# Patient Record
Sex: Female | Born: 1970
Health system: Southern US, Community
[De-identification: ages and names within clinical notes are randomized; demographics above are authoritative.]

## PROBLEM LIST (undated history)

## (undated) DIAGNOSIS — S62609A Fracture of unspecified phalanx of unspecified finger, initial encounter for closed fracture: Secondary | ICD-10-CM

## (undated) DIAGNOSIS — U071 COVID-19: Secondary | ICD-10-CM

## (undated) DIAGNOSIS — I8390 Asymptomatic varicose veins of unspecified lower extremity: Secondary | ICD-10-CM

## (undated) DIAGNOSIS — D649 Anemia, unspecified: Secondary | ICD-10-CM

## (undated) DIAGNOSIS — Z803 Family history of malignant neoplasm of breast: Secondary | ICD-10-CM

## (undated) DIAGNOSIS — C801 Malignant (primary) neoplasm, unspecified: Secondary | ICD-10-CM

## (undated) DIAGNOSIS — R112 Nausea with vomiting, unspecified: Secondary | ICD-10-CM

## (undated) DIAGNOSIS — G5600 Carpal tunnel syndrome, unspecified upper limb: Secondary | ICD-10-CM

## (undated) DIAGNOSIS — K219 Gastro-esophageal reflux disease without esophagitis: Secondary | ICD-10-CM

## (undated) DIAGNOSIS — Z9889 Other specified postprocedural states: Secondary | ICD-10-CM

## (undated) HISTORY — PX: ABDOMINAL HYSTERECTOMY: SHX81

## (undated) HISTORY — DX: Family history of malignant neoplasm of breast: Z80.3

## (undated) HISTORY — DX: Asymptomatic varicose veins of unspecified lower extremity: I83.90

## (undated) HISTORY — PX: VARICOSE VEIN SURGERY: SHX832

## (undated) HISTORY — DX: Carpal tunnel syndrome, unspecified upper limb: G56.00

---

## 2003-04-02 HISTORY — PX: TUBAL LIGATION: SHX77

## 2008-09-02 ENCOUNTER — Ambulatory Visit: Payer: Self-pay | Admitting: Internal Medicine

## 2008-09-20 ENCOUNTER — Ambulatory Visit: Payer: Self-pay | Admitting: Gastroenterology

## 2008-10-18 ENCOUNTER — Ambulatory Visit: Payer: Self-pay | Admitting: Internal Medicine

## 2009-12-28 ENCOUNTER — Ambulatory Visit: Payer: Self-pay | Admitting: Gastroenterology

## 2010-01-11 ENCOUNTER — Ambulatory Visit: Payer: Self-pay | Admitting: Gastroenterology

## 2010-01-25 ENCOUNTER — Ambulatory Visit: Payer: Self-pay | Admitting: Gastroenterology

## 2011-05-16 ENCOUNTER — Ambulatory Visit: Payer: Self-pay

## 2011-05-21 ENCOUNTER — Ambulatory Visit (INDEPENDENT_AMBULATORY_CARE_PROVIDER_SITE_OTHER): Payer: Self-pay

## 2011-05-21 DIAGNOSIS — R197 Diarrhea, unspecified: Secondary | ICD-10-CM

## 2011-06-06 ENCOUNTER — Ambulatory Visit (INDEPENDENT_AMBULATORY_CARE_PROVIDER_SITE_OTHER): Payer: Self-pay

## 2011-06-06 DIAGNOSIS — K5900 Constipation, unspecified: Secondary | ICD-10-CM

## 2011-06-18 ENCOUNTER — Ambulatory Visit (INDEPENDENT_AMBULATORY_CARE_PROVIDER_SITE_OTHER): Payer: Self-pay

## 2011-06-18 DIAGNOSIS — K5900 Constipation, unspecified: Secondary | ICD-10-CM

## 2011-07-04 ENCOUNTER — Ambulatory Visit (INDEPENDENT_AMBULATORY_CARE_PROVIDER_SITE_OTHER): Payer: Self-pay

## 2011-07-04 DIAGNOSIS — K589 Irritable bowel syndrome without diarrhea: Secondary | ICD-10-CM

## 2011-08-01 ENCOUNTER — Ambulatory Visit (INDEPENDENT_AMBULATORY_CARE_PROVIDER_SITE_OTHER): Payer: Self-pay

## 2011-08-01 DIAGNOSIS — K589 Irritable bowel syndrome without diarrhea: Secondary | ICD-10-CM

## 2011-08-29 ENCOUNTER — Ambulatory Visit (INDEPENDENT_AMBULATORY_CARE_PROVIDER_SITE_OTHER): Payer: Self-pay

## 2011-08-29 DIAGNOSIS — K589 Irritable bowel syndrome without diarrhea: Secondary | ICD-10-CM

## 2011-09-12 ENCOUNTER — Ambulatory Visit (INDEPENDENT_AMBULATORY_CARE_PROVIDER_SITE_OTHER): Payer: Self-pay

## 2011-09-12 DIAGNOSIS — K589 Irritable bowel syndrome without diarrhea: Secondary | ICD-10-CM

## 2012-02-20 ENCOUNTER — Ambulatory Visit (INDEPENDENT_AMBULATORY_CARE_PROVIDER_SITE_OTHER): Payer: Self-pay

## 2012-02-20 DIAGNOSIS — K589 Irritable bowel syndrome without diarrhea: Secondary | ICD-10-CM

## 2012-04-01 HISTORY — PX: AUGMENTATION MAMMAPLASTY: SUR837

## 2012-04-28 ENCOUNTER — Other Ambulatory Visit: Payer: Self-pay

## 2012-05-28 ENCOUNTER — Other Ambulatory Visit: Payer: Self-pay

## 2012-12-16 ENCOUNTER — Encounter: Payer: Self-pay | Admitting: Emergency Medicine

## 2012-12-16 ENCOUNTER — Emergency Department (INDEPENDENT_AMBULATORY_CARE_PROVIDER_SITE_OTHER)
Admission: EM | Admit: 2012-12-16 | Discharge: 2012-12-16 | Disposition: A | Discharge status: Home / Self Care | Payer: 59 | Source: Home / Self Care

## 2012-12-16 DIAGNOSIS — J069 Acute upper respiratory infection, unspecified: Secondary | ICD-10-CM

## 2012-12-16 MED ORDER — AMOXICILLIN 875 MG PO TABS: 875.0000 mg | ORAL_TABLET | Freq: Two times a day (BID) | ORAL | Status: DC

## 2012-12-16 MED ORDER — BENZONATATE 200 MG PO CAPS: 200.0000 mg | ORAL_CAPSULE | Freq: Every day | ORAL | Status: DC

## 2012-12-16 NOTE — ED Notes (Signed)
Reports 3 day history of fever, congestion, cough; now primarily congestion. Took Day-quil this a.m. and ibuprofen 800mg . At 1700 today.

## 2012-12-16 NOTE — ED Provider Notes (Signed)
CSN: [id number]     Arrival date & time 12/16/12  1853 History   First MD Initiated Contact with Patient 12/16/12 1941     Chief Complaint  Patient presents with  . Nasal Congestion  . Fever     HPI Comments: Patient returned from a cruise 3 days ago when she developed URI symptoms consisting of a mild cough, nasal congestion, headache, chills/sweats, and fatigue.     The history is provided by the patient.    History reviewed. No pertinent past medical history. Past Surgical History  Procedure Laterality Date  . Tubal ligation     Family History  Problem Relation Age of Onset  . Heart failure Father   . Hypertension Father    History  Substance Use Topics  . Smoking status: Never Smoker   . Smokeless tobacco: Not on file  . Alcohol Use: Not on file   OB History   Grav Para Term Preterm Abortions TAB SAB Ect Mult Living                 Review of Systems + sore throat, resolved + cough No pleuritic pain No wheezing + nasal congestion + post-nasal drainage No sinus pain/pressure No itchy/red eyes + earache, resolved No hemoptysis No SOB No fever, + chills No nausea No vomiting No abdominal pain No diarrhea No urinary symptoms No skin rashes + fatigue No myalgias + headache Used OTC meds without relief  Allergies  Review of patient's allergies indicates no known allergies.  Home Medications   Current Outpatient Rx  Name  Route  Sig  Dispense  Refill  . amoxicillin (AMOXIL) 875 MG tablet   Oral   Take 1 tablet (875 mg total) by mouth 2 (two) times daily. (Rx void after 12/24/12)   20 tablet   0   . benzonatate (TESSALON) 200 MG capsule   Oral   Take 1 capsule (200 mg total) by mouth at bedtime. Take as needed for cough   12 capsule   0    BP 120/79  Temp(Src) 98.7 F (37.1 C) (Oral)  Resp 18  Ht 5\' 7"  (1.702 m)  Wt 190 lb (86.183 kg)  BMI 29.75 kg/m2  SpO2 98%  LMP 12/14/2012 Physical Exam Nursing notes and Vital Signs  reviewed. Appearance:  Patient appears healthy, stated age, and in no acute distress Eyes:  Pupils are equal, round, and reactive to light and accomodation.  Extraocular movement is intact.  Conjunctivae are not inflamed  Ears:  Canals normal.  Tympanic membranes normal.  Nose:  Mildly congested turbinates.  No sinus tenderness.    Pharynx:  Normal Neck:  Supple.  Slightly tender shotty posterior nodes are palpated bilaterally  Lungs:  Clear to auscultation.  Breath sounds are equal.  Heart:  Regular rate and rhythm without murmurs, rubs, or gallops.  Abdomen:  Nontender without masses or hepatosplenomegaly.  Bowel sounds are present.  No CVA or flank tenderness.  Extremities:  No edema.  No calf tenderness Skin:  No rash present.   ED Course  Procedures  none     MDM   1. Acute upper respiratory infections of unspecified site    There is no evidence of bacterial infection today.   Treat symptomatically for now  Prescription written for Benzonatate (Tessalon) to take at bedtime for night-time cough.  Take Mucinex D (guaifenesin with decongestant) twice daily for congestion.  Increase fluid intake, rest. May use Afrin nasal spray (or generic oxymetazoline) twice daily  for about 5 days.  Also recommend using saline nasal spray several times daily and saline nasal irrigation (AYR is a common brand) Stop all antihistamines for now, and other non-prescription cough/cold preparations. Begin Amoxicillin if not improving about one week or if persistent fever develops (Given a prescription to hold, with an expiration date)  Follow-up with family doctor if not improving about10 days.    Lattie Haw, MD 12/21/12 1052

## 2013-10-06 ENCOUNTER — Emergency Department (HOSPITAL_COMMUNITY)
Admission: EM | Admit: 2013-10-06 | Discharge: 2013-10-06 | Disposition: A | Discharge status: Home / Self Care | Payer: 59 | Attending: Emergency Medicine | Admitting: Emergency Medicine

## 2013-10-06 ENCOUNTER — Encounter (HOSPITAL_COMMUNITY): Payer: Self-pay | Admitting: Emergency Medicine

## 2013-10-06 DIAGNOSIS — Z79899 Other long term (current) drug therapy: Secondary | ICD-10-CM | POA: Insufficient documentation

## 2013-10-06 DIAGNOSIS — Y92410 Unspecified street and highway as the place of occurrence of the external cause: Secondary | ICD-10-CM | POA: Insufficient documentation

## 2013-10-06 DIAGNOSIS — Y9389 Activity, other specified: Secondary | ICD-10-CM | POA: Diagnosis not present

## 2013-10-06 DIAGNOSIS — IMO0002 Reserved for concepts with insufficient information to code with codable children: Secondary | ICD-10-CM | POA: Diagnosis not present

## 2013-10-06 DIAGNOSIS — M545 Low back pain, unspecified: Secondary | ICD-10-CM

## 2013-10-06 MED ORDER — METHOCARBAMOL 500 MG PO TABS
750.0000 mg | ORAL_TABLET | Freq: Once | ORAL | Status: AC
  Administered 2013-10-06: 750 mg via ORAL
  Filled 2013-10-06: qty 2

## 2013-10-06 MED ORDER — METHOCARBAMOL 500 MG PO TABS: 500.0000 mg | ORAL_TABLET | Freq: Two times a day (BID) | ORAL | Status: DC
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
## 2013-10-08 NOTE — ED Provider Notes (Signed)
Medical screening examination/treatment/procedure(s) were performed by non-physician practitioner and as supervising physician I was immediately available for consultation/collaboration.    Dot Lanes, MD 10/08/13 [phone]

## 2013-10-11 ENCOUNTER — Emergency Department (INDEPENDENT_AMBULATORY_CARE_PROVIDER_SITE_OTHER)
Admission: EM | Admit: 2013-10-11 | Discharge: 2013-10-11 | Disposition: A | Discharge status: Home / Self Care | Payer: 59 | Source: Home / Self Care | Attending: Emergency Medicine | Admitting: Emergency Medicine

## 2013-10-11 ENCOUNTER — Encounter (HOSPITAL_COMMUNITY): Payer: Self-pay | Admitting: Emergency Medicine

## 2013-10-11 DIAGNOSIS — S8010XA Contusion of unspecified lower leg, initial encounter: Secondary | ICD-10-CM

## 2013-10-11 DIAGNOSIS — S8011XD Contusion of right lower leg, subsequent encounter: Secondary | ICD-10-CM

## 2013-10-11 DIAGNOSIS — S239XXA Sprain of unspecified parts of thorax, initial encounter: Secondary | ICD-10-CM

## 2013-10-11 DIAGNOSIS — S39012D Strain of muscle, fascia and tendon of lower back, subsequent encounter: Secondary | ICD-10-CM

## 2013-10-11 DIAGNOSIS — S335XXA Sprain of ligaments of lumbar spine, initial encounter: Secondary | ICD-10-CM

## 2013-10-11 DIAGNOSIS — S29019D Strain of muscle and tendon of unspecified wall of thorax, subsequent encounter: Secondary | ICD-10-CM

## 2013-10-11 MED ORDER — MEDICAL COMPRESSION STOCKINGS MISC: Status: DC

## 2013-10-11 MED ORDER — CYCLOBENZAPRINE HCL 5 MG PO TABS: 5.0000 mg | ORAL_TABLET | Freq: Two times a day (BID) | ORAL | Status: DC | PRN
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
## 2013-10-11 NOTE — Discharge Instructions (Signed)
Back Exercises Back exercises help treat and prevent back injuries. The goal of back exercises is to increase the strength of your abdominal and back muscles and the flexibility of your back. These exercises should be started when you no longer have back pain. Back exercises include:  Pelvic Tilt. Lie on your back with your knees bent. Tilt your pelvis until the lower part of your back is against the floor. Hold this position 5 to 10 sec and repeat 5 to 10 times.  Knee to Chest. Pull first 1 knee up against your chest and hold for 20 to 30 seconds, repeat this with the other knee, and then both knees. This may be done with the other leg straight or bent, whichever feels better.  Sit-Ups or Curl-Ups. Bend your knees 90 degrees. Start with tilting your pelvis, and do a partial, slow sit-up, lifting your trunk only 30 to 45 degrees off the floor. Take at least 2 to 3 seconds for each sit-up. Do not do sit-ups with your knees out straight. If partial sit-ups are difficult, simply do the above but with only tightening your abdominal muscles and holding it as directed.  Hip-Lift. Lie on your back with your knees flexed 90 degrees. Push down with your feet and shoulders as you raise your hips a couple inches off the floor; hold for 10 seconds, repeat 5 to 10 times.  Back arches. Lie on your stomach, propping yourself up on bent elbows. Slowly press on your hands, causing an arch in your low back. Repeat 3 to 5 times. Any initial stiffness and discomfort should lessen with repetition over time.  Shoulder-Lifts. Lie face down with arms beside your body. Keep hips and torso pressed to floor as you slowly lift your head and shoulders off the floor. Do not overdo your exercises, especially in the beginning. Exercises may cause you some mild back discomfort which lasts for a few minutes; however, if the pain is more severe, or lasts for more than 15 minutes, do not continue exercises until you see your caregiver.  Improvement with exercise therapy for back problems is slow.  See your caregivers for assistance with developing a proper back exercise program. Document Released: 04/25/2004 Document Revised: 06/10/2011 Document Reviewed: 01/17/2011 [facility] Patient Information 2015 [place], [place]. This information is not intended to replace advice given to you by your health care provider. Make sure you discuss any questions you have with your health care provider.  Back Pain, Adult Low back pain is very common. About 1 in 5 people have back pain.The cause of low back pain is rarely dangerous. The pain often gets better over time.About half of people with a sudden onset of back pain feel better in just 2 weeks. About 8 in 10 people feel better by 6 weeks.  CAUSES Some common causes of back pain include:  Strain of the muscles or ligaments supporting the spine.  Wear and tear (degeneration) of the spinal discs.  Arthritis.  Direct injury to the back. DIAGNOSIS Most of the time, the direct cause of low back pain is not known.However, back pain can be treated effectively even when the exact cause of the pain is unknown.Answering your caregiver's questions about your overall health and symptoms is one of the most accurate ways to make sure the cause of your pain is not dangerous. If your caregiver needs more information, he or she may order lab work or imaging tests (X-rays or MRIs).However, even if imaging tests show changes in your  back, this usually does not require surgery. HOME CARE INSTRUCTIONS For many people, back pain returns.Since low back pain is rarely dangerous, it is often a condition that people can learn to [facility] their own.   Remain active. It is stressful on the back to sit or stand in one place. Do not sit, drive, or stand in one place for more than 30 minutes at a time. Take short walks on level surfaces as soon as pain allows.Try to increase the length of time you walk each  day.  Do not stay in bed.Resting more than 1 or 2 days can delay your recovery.  Do not avoid exercise or work.Your body is made to move.It is not dangerous to be active, even though your back may hurt.Your back will likely heal faster if you return to being active before your pain is gone.  Pay attention to your body when you bend and lift. Many people have less discomfortwhen lifting if they bend their knees, keep the load close to their bodies,and avoid twisting. Often, the most comfortable positions are those that put less stress on your recovering back.  Find a comfortable position to sleep. Use a firm mattress and lie on your side with your knees slightly bent. If you lie on your back, put a pillow under your knees.  Only take over-the-counter or prescription medicines as directed by your caregiver. Over-the-counter medicines to reduce pain and inflammation are often the most helpful.Your caregiver may prescribe muscle relaxant drugs.These medicines help dull your pain so you can more quickly return to your normal activities and healthy exercise.  Put ice on the injured area.  Put ice in a plastic bag.  Place a towel between your skin and the bag.  Leave the ice on for 15-20 minutes, 03-04 times a day for the first 2 to 3 days. After that, ice and heat may be alternated to reduce pain and spasms.  Ask your caregiver about trying back exercises and gentle massage. This may be of some benefit.  Avoid feeling anxious or stressed.Stress increases muscle tension and can worsen back pain.It is important to recognize when you are anxious or stressed and learn ways to manage it.Exercise is a great option. SEEK MEDICAL CARE IF:  You have pain that is not relieved with rest or medicine.  You have pain that does not improve in 1 week.  You have new symptoms.  You are generally not feeling well. SEEK IMMEDIATE MEDICAL CARE IF:   You have pain that radiates from your back into  your legs.  You develop new bowel or bladder control problems.  You have unusual weakness or numbness in your arms or legs.  You develop nausea or vomiting.  You develop abdominal pain.  You feel faint. Document Released: 03/18/2005 Document Revised: 09/17/2011 Document Reviewed: 08/06/2010 [facility] Patient Information 2015 [place], [place]. This information is not intended to replace advice given to you by your health care provider. Make sure you discuss any questions you have with your health care provider.  Lumbosacral Strain Lumbosacral strain is a strain of any of the parts that make up your lumbosacral vertebrae. Your lumbosacral vertebrae are the bones that make up the lower third of your backbone. Your lumbosacral vertebrae are held together by muscles and tough, fibrous tissue (ligaments).  CAUSES  A sudden blow to your back can cause lumbosacral strain. Also, anything that causes an excessive stretch of the muscles in the low back can cause this strain. This is typically  seen when people exert themselves strenuously, fall, lift heavy objects, bend, or crouch repeatedly. RISK FACTORS  Physically demanding work.  Participation in pushing or pulling sports or sports that require a sudden twist of the back (tennis, golf, baseball).  Weight lifting.  Excessive lower back curvature.  Forward-tilted pelvis.  Weak back or abdominal muscles or both.  Tight hamstrings. SIGNS AND SYMPTOMS  Lumbosacral strain may cause pain in the area of your injury or pain that moves (radiates) down your leg.  DIAGNOSIS Your health care provider can often diagnose lumbosacral strain through a physical exam. In some cases, you may need tests such as X-ray exams.  TREATMENT  Treatment for your lower back injury depends on many factors that your clinician will have to evaluate. However, most treatment will include the use of anti-inflammatory medicines. HOME CARE INSTRUCTIONS   Avoid hard  physical activities (tennis, racquetball, waterskiing) if you are not in proper physical condition for it. This may aggravate or create problems.  If you have a back problem, avoid sports requiring sudden body movements. Swimming and walking are generally safer activities.  Maintain good posture.  Maintain a healthy weight.  For acute conditions, you may put ice on the injured area.  Put ice in a plastic bag.  Place a towel between your skin and the bag.  Leave the ice on for 20 minutes, 2-3 times a day.  When the low back starts healing, stretching and strengthening exercises may be recommended. SEEK MEDICAL CARE IF:  Your back pain is getting worse.  You experience severe back pain not relieved with medicines. SEEK IMMEDIATE MEDICAL CARE IF:   You have numbness, tingling, weakness, or problems with the use of your arms or legs.  There is a change in bowel or bladder control.  You have increasing pain in any area of the body, including your belly (abdomen).  You notice shortness of breath, dizziness, or feel faint.  You feel sick to your stomach (nauseous), are throwing up (vomiting), or become sweaty.  You notice discoloration of your toes or legs, or your feet get very cold. MAKE SURE YOU:   Understand these instructions.  Will watch your condition.  Will get help right away if you are not doing well or get worse. Document Released: 12/26/2004 Document Revised: 03/23/2013 Document Reviewed: 11/04/2012 [facility] Patient Information 2015 [place], [place]. This information is not intended to replace advice given to you by your health care provider. Make sure you discuss any questions you have with your health care provider.  Muscle Strain A muscle strain is an injury that occurs when a muscle is stretched beyond its normal length. Usually a small number of muscle fibers are torn when this happens. Muscle strain is rated in degrees. First-degree strains have the least  amount of muscle fiber tearing and pain. Second-degree and third-degree strains have increasingly more tearing and pain.  Usually, recovery from muscle strain takes 1-2 weeks. Complete healing takes 5-6 weeks.  CAUSES  Muscle strain happens when a sudden, violent force placed on a muscle stretches it too far. This may occur with lifting, sports, or a fall.  RISK FACTORS Muscle strain is especially common in athletes.  SIGNS AND SYMPTOMS At the site of the muscle strain, there may be:  Pain.  Bruising.  Swelling.  Difficulty using the muscle due to pain or lack of normal function. DIAGNOSIS  Your health care provider will perform a physical exam and ask about your medical history. TREATMENT  Often,  the best treatment for a muscle strain is resting, icing, and applying cold compresses to the injured area.  HOME CARE INSTRUCTIONS   Use the PRICE method of treatment to promote muscle healing during the first 2-3 days after your injury. The PRICE method involves:  Protecting the muscle from being injured again.  Restricting your activity and resting the injured body part.  Icing your injury. To do this, put ice in a plastic bag. Place a towel between your skin and the bag. Then, apply the ice and leave it on from 15-20 minutes each hour. After the third day, switch to moist heat packs.  Apply compression to the injured area with a splint or elastic bandage. Be careful not to wrap it too tightly. This may interfere with blood circulation or increase swelling.  Elevate the injured body part above the level of your heart as often as you can.  Only take over-the-counter or prescription medicines for pain, discomfort, or fever as directed by your health care provider.  Warming up prior to exercise helps to prevent future muscle strains. SEEK MEDICAL CARE IF:   You have increasing pain or swelling in the injured area.  You have numbness, tingling, or a significant loss of strength in  the injured area. MAKE SURE YOU:   Understand these instructions.  Will watch your condition.  Will get help right away if you are not doing well or get worse. Document Released: 03/18/2005 Document Revised: 01/06/2013 Document Reviewed: 10/15/2012 [facility] Patient Information 2015 [place], [place]. This information is not intended to replace advice given to you by your health care provider. Make sure you discuss any questions you have with your health care provider.

## 2013-10-11 NOTE — ED Provider Notes (Signed)
Medical screening examination/treatment/procedure(s) were performed by resident physician or non-physician practitioner and as supervising physician I was immediately available for consultation/collaboration.   Pauline Good MD.   Billy Fischer, MD 10/11/13 2123
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
## 2013-11-10 ENCOUNTER — Ambulatory Visit: Payer: No Typology Code available for payment source | Attending: Sports Medicine | Admitting: Physical Therapy

## 2013-11-10 DIAGNOSIS — IMO0001 Reserved for inherently not codable concepts without codable children: Secondary | ICD-10-CM | POA: Diagnosis not present

## 2013-11-10 DIAGNOSIS — M545 Low back pain, unspecified: Secondary | ICD-10-CM | POA: Insufficient documentation

## 2013-11-10 DIAGNOSIS — M542 Cervicalgia: Secondary | ICD-10-CM | POA: Diagnosis not present

## 2013-11-10 DIAGNOSIS — M546 Pain in thoracic spine: Secondary | ICD-10-CM | POA: Insufficient documentation

## 2013-11-15 ENCOUNTER — Ambulatory Visit: Payer: No Typology Code available for payment source | Admitting: Rehabilitation

## 2013-11-17 ENCOUNTER — Ambulatory Visit: Payer: No Typology Code available for payment source | Admitting: Rehabilitation

## 2013-11-17 DIAGNOSIS — IMO0001 Reserved for inherently not codable concepts without codable children: Secondary | ICD-10-CM | POA: Diagnosis not present

## 2013-11-22 ENCOUNTER — Ambulatory Visit: Payer: No Typology Code available for payment source | Admitting: Rehabilitation

## 2013-11-22 DIAGNOSIS — IMO0001 Reserved for inherently not codable concepts without codable children: Secondary | ICD-10-CM | POA: Diagnosis not present

## 2013-11-24 ENCOUNTER — Ambulatory Visit: Payer: No Typology Code available for payment source | Admitting: Rehabilitation

## 2013-11-24 DIAGNOSIS — IMO0001 Reserved for inherently not codable concepts without codable children: Secondary | ICD-10-CM | POA: Diagnosis not present

## 2013-11-29 ENCOUNTER — Ambulatory Visit: Payer: No Typology Code available for payment source | Admitting: Rehabilitation

## 2013-11-29 DIAGNOSIS — IMO0001 Reserved for inherently not codable concepts without codable children: Secondary | ICD-10-CM | POA: Diagnosis not present

## 2013-12-01 ENCOUNTER — Ambulatory Visit: Payer: No Typology Code available for payment source | Attending: Sports Medicine | Admitting: Physical Therapy

## 2013-12-01 DIAGNOSIS — M542 Cervicalgia: Secondary | ICD-10-CM | POA: Diagnosis not present

## 2013-12-01 DIAGNOSIS — M545 Low back pain, unspecified: Secondary | ICD-10-CM | POA: Diagnosis not present

## 2013-12-01 DIAGNOSIS — IMO0001 Reserved for inherently not codable concepts without codable children: Secondary | ICD-10-CM | POA: Diagnosis present

## 2013-12-01 DIAGNOSIS — M546 Pain in thoracic spine: Secondary | ICD-10-CM | POA: Diagnosis not present

## 2013-12-08 ENCOUNTER — Ambulatory Visit: Payer: No Typology Code available for payment source | Admitting: Rehabilitation

## 2013-12-08 DIAGNOSIS — IMO0001 Reserved for inherently not codable concepts without codable children: Secondary | ICD-10-CM | POA: Diagnosis not present

## 2013-12-13 ENCOUNTER — Ambulatory Visit: Payer: No Typology Code available for payment source | Admitting: Rehabilitation

## 2013-12-13 DIAGNOSIS — IMO0001 Reserved for inherently not codable concepts without codable children: Secondary | ICD-10-CM | POA: Diagnosis not present

## 2013-12-15 ENCOUNTER — Ambulatory Visit: Payer: No Typology Code available for payment source | Admitting: Physical Therapy

## 2013-12-15 DIAGNOSIS — IMO0001 Reserved for inherently not codable concepts without codable children: Secondary | ICD-10-CM | POA: Diagnosis not present

## 2013-12-22 ENCOUNTER — Ambulatory Visit: Payer: No Typology Code available for payment source | Admitting: Rehabilitation

## 2013-12-22 DIAGNOSIS — IMO0001 Reserved for inherently not codable concepts without codable children: Secondary | ICD-10-CM | POA: Diagnosis not present

## 2013-12-29 ENCOUNTER — Ambulatory Visit: Payer: No Typology Code available for payment source | Admitting: Physical Therapy

## 2013-12-29 DIAGNOSIS — IMO0001 Reserved for inherently not codable concepts without codable children: Secondary | ICD-10-CM | POA: Diagnosis not present

## 2014-01-03 ENCOUNTER — Ambulatory Visit: Payer: No Typology Code available for payment source | Attending: Sports Medicine | Admitting: Rehabilitation

## 2014-01-03 DIAGNOSIS — M546 Pain in thoracic spine: Secondary | ICD-10-CM | POA: Diagnosis not present

## 2014-01-03 DIAGNOSIS — M542 Cervicalgia: Secondary | ICD-10-CM | POA: Insufficient documentation

## 2014-01-03 DIAGNOSIS — M545 Low back pain: Secondary | ICD-10-CM | POA: Diagnosis not present

## 2014-01-05 ENCOUNTER — Ambulatory Visit: Payer: No Typology Code available for payment source | Admitting: Physical Therapy

## 2014-01-10 ENCOUNTER — Ambulatory Visit: Payer: No Typology Code available for payment source | Admitting: Rehabilitation

## 2014-01-10 DIAGNOSIS — M545 Low back pain: Secondary | ICD-10-CM | POA: Diagnosis not present

## 2014-01-12 ENCOUNTER — Ambulatory Visit: Payer: No Typology Code available for payment source | Admitting: Physical Therapy

## 2014-01-17 ENCOUNTER — Ambulatory Visit: Payer: No Typology Code available for payment source | Admitting: Rehabilitation

## 2014-01-17 DIAGNOSIS — M545 Low back pain: Secondary | ICD-10-CM | POA: Diagnosis not present

## 2014-01-19 ENCOUNTER — Ambulatory Visit: Payer: No Typology Code available for payment source | Admitting: Physical Therapy

## 2014-01-27 ENCOUNTER — Other Ambulatory Visit (HOSPITAL_COMMUNITY): Payer: Self-pay | Admitting: Sports Medicine

## 2014-01-27 DIAGNOSIS — M5412 Radiculopathy, cervical region: Secondary | ICD-10-CM

## 2014-01-27 DIAGNOSIS — M542 Cervicalgia: Secondary | ICD-10-CM

## 2014-02-03 ENCOUNTER — Ambulatory Visit (HOSPITAL_COMMUNITY)
Admission: RE | Admit: 2014-02-03 | Discharge: 2014-02-03 | Disposition: A | Discharge status: Home / Self Care | Payer: 59 | Source: Ambulatory Visit | Attending: Sports Medicine | Admitting: Sports Medicine

## 2014-02-03 ENCOUNTER — Ambulatory Visit (HOSPITAL_COMMUNITY): Payer: No Typology Code available for payment source

## 2014-02-03 DIAGNOSIS — M5412 Radiculopathy, cervical region: Secondary | ICD-10-CM

## 2014-02-03 DIAGNOSIS — M542 Cervicalgia: Secondary | ICD-10-CM | POA: Diagnosis present

## 2014-02-03 DIAGNOSIS — M778 Other enthesopathies, not elsewhere classified: Secondary | ICD-10-CM | POA: Insufficient documentation

## 2014-02-03 DIAGNOSIS — M25519 Pain in unspecified shoulder: Secondary | ICD-10-CM | POA: Diagnosis present

## 2014-02-03 DIAGNOSIS — M5022 Other cervical disc displacement, mid-cervical region: Secondary | ICD-10-CM | POA: Diagnosis not present

## 2014-02-03 DIAGNOSIS — R51 Headache: Secondary | ICD-10-CM | POA: Diagnosis present

## 2014-02-08 ENCOUNTER — Other Ambulatory Visit (HOSPITAL_COMMUNITY): Payer: Self-pay | Admitting: Sports Medicine

## 2014-02-08 DIAGNOSIS — M542 Cervicalgia: Secondary | ICD-10-CM

## 2014-02-10 ENCOUNTER — Ambulatory Visit (HOSPITAL_COMMUNITY): Payer: No Typology Code available for payment source

## 2014-02-10 ENCOUNTER — Ambulatory Visit (HOSPITAL_COMMUNITY): Payer: 59

## 2014-02-11 ENCOUNTER — Ambulatory Visit (HOSPITAL_BASED_OUTPATIENT_CLINIC_OR_DEPARTMENT_OTHER)
Admission: RE | Admit: 2014-02-11 | Discharge: 2014-02-11 | Disposition: A | Discharge status: Home / Self Care | Payer: 59 | Source: Ambulatory Visit | Attending: Sports Medicine | Admitting: Sports Medicine

## 2014-02-11 DIAGNOSIS — M5081 Other cervical disc disorders,  high cervical region: Secondary | ICD-10-CM | POA: Insufficient documentation

## 2014-02-11 DIAGNOSIS — M542 Cervicalgia: Secondary | ICD-10-CM | POA: Insufficient documentation

## 2014-03-02 ENCOUNTER — Encounter: Payer: 59 | Admitting: Physical Therapy

## 2014-05-04 ENCOUNTER — Ambulatory Visit: Payer: No Typology Code available for payment source | Admitting: Physical Therapy

## 2014-05-11 ENCOUNTER — Ambulatory Visit: Payer: 59 | Attending: Sports Medicine | Admitting: Physical Therapy

## 2014-05-11 DIAGNOSIS — M6528 Calcific tendinitis, other site: Secondary | ICD-10-CM | POA: Insufficient documentation

## 2014-05-11 DIAGNOSIS — M542 Cervicalgia: Secondary | ICD-10-CM | POA: Insufficient documentation

## 2014-05-11 DIAGNOSIS — M546 Pain in thoracic spine: Secondary | ICD-10-CM | POA: Diagnosis not present

## 2014-05-16 ENCOUNTER — Ambulatory Visit: Payer: 59 | Admitting: Physical Therapy

## 2014-05-18 ENCOUNTER — Ambulatory Visit: Payer: 59 | Admitting: Physical Therapy

## 2014-05-25 ENCOUNTER — Ambulatory Visit: Payer: 59 | Admitting: Physical Therapy

## 2014-05-25 ENCOUNTER — Encounter: Payer: Self-pay | Admitting: Physical Therapy

## 2014-05-25 DIAGNOSIS — M542 Cervicalgia: Secondary | ICD-10-CM | POA: Diagnosis not present

## 2014-05-25 NOTE — Therapy (Signed)
[facility] [address]  [place], [place], [address] Phone: [phone]   Fax:  [phone]  Physical Therapy Treatment  Patient Details  Name: Joanne Wilson MRN: [id number] Date of Birth: 1970/08/29 Referring Provider:  Berle Mull, MD  Encounter Date: 05/25/2014      PT End of Session - 05/25/14 1641    Visit Number 2   Number of Visits 12   Date for PT Re-Evaluation 06/22/14   PT Start Time 1622   PT Stop Time 8144   PT Time Calculation (min) 81 min      History reviewed. No pertinent past medical history.  History reviewed. No pertinent past surgical history.  There were no vitals taken for this visit.  Visit Diagnosis:  Neck pain      Subjective Assessment - 05/25/14 1634    Symptoms pt has missed 2 appointments since initial eval.  She returns today with c/o 3/10 average pain in L neck and upper scapular region.  She has noted increase in pain over the past 2 days for unkonwn reason and rates pain 6/10.  She notes N/T into L hand intermittently throughout the day (states "whole hand").  States headache frequency has decreased to approx 1x/month.   Patient Stated Goals less pain with work duties   Currently in Pain? Yes   Pain Score 6    Pain Location --  L neck and upper scapular region   Aggravating Factors  neck flexion, Raising arms, neck rotation to R   Pain Relieving Factors medication, heat   Multiple Pain Sites No       TODAY'S TREATMENT: Manual Therapy - TPR L UT with stretch to same, Trigger Point Dry Needling to L UT with twitch response noted (verbal informed consent given prior to treatment).  Then c-spine distraction and B Rotation mobes grade 3 (limited tolerance).  L UE nerve glides. TherEx - NuStep LVL 3 3' Hooklying on 1/2 Foam Roll: T-spine extension and pec stretch 2', Pullover without wt 10x, B ER Yellow TB 10x, B Horiz ABD Yellow TB 10x.             [facility] Adult  PT Treatment/Exercise - 05/25/14 0001    Modalities   Modalities Traction   Traction   Type of Traction Cervical   Min (lbs) 5  increased to 6   Max (lbs) 10  increased to 12 halfway through   Hold Time 60   Rest Time 20   Time 15                  PT Short Term Goals - 05/25/14 1622    PT SHORT TERM GOAL #1   Title pt independent with advanced HEP by 05/18/14   Status Achieved           PT Long Term Goals - 05/25/14 1622    PT LONG TERM GOAL #1   Title independent with advanced HEP as necessary by 06/22/14   Status On-going   PT LONG TERM GOAL #2   Title pt displays / verbalizes understanding of improved posture/body mechanics by 06/22/14   Status On-going   PT LONG TERM GOAL #3   Title pt able to perform all chores and drive without limitation by neck or upper back pain by 06/22/14   Status On-going   PT LONG TERM GOAL #4   Title pt reports headache frequency decreases to prior level of function by 06/22/14  Status On-going   PT LONG TERM GOAL #5   Title pt able to perform all work duties without limitation by neck or upper back pain by 06/22/14   Status On-going               Plan - 05/25/14 1732    Clinical Impression Statement pt with increased neck pain past couple days for unknown reason so large deal of treatment focused on trying to reduce pain and improve mobility.  L UE ULTT very pronounced but improved well with nerve glides.  There is some question of TOS as pt with POS special testing with N/T and cold hand. This should improve with scapular and c-spine stability training and pec stretching.   Pt will benefit from skilled therapeutic intervention in order to improve on the following deficits Decreased activity tolerance;Decreased range of motion;Improper body mechanics;Pain;Impaired UE functional use;Decreased strength;Decreased mobility   Rehab Potential Good   PT Frequency 2x / week   PT Duration 6 weeks  POC began 05/11/14   PT  Treatment/Interventions Cryotherapy;Electrical Stimulation;Moist Heat;Therapeutic activities;Patient/family education;Therapeutic exercise;Dry needling;Manual techniques;Neuromuscular re-education;Traction   PT Next Visit Plan progress as able   Consulted and Agree with Plan of Care Patient        Problem List There are no active problems to display for this patient.   [facility]  PT, OCS  05/25/2014, 5:47 PM  [facility] [address]  [place], [place], [address] Phone: [phone]   Fax:  [phone]

## 2014-05-30 ENCOUNTER — Ambulatory Visit: Payer: 59 | Admitting: Rehabilitation

## 2014-05-30 DIAGNOSIS — M542 Cervicalgia: Secondary | ICD-10-CM

## 2014-05-30 NOTE — Therapy (Signed)
[facility] [address]  [place], [place], [address] Phone: [phone]   Fax:  [phone]  Physical Therapy Treatment  Patient Details  Name: Joanne Wilson MRN: [id number] Date of Birth: 26-Apr-1970 Referring Provider:  Berle Mull, MD  Encounter Date: 05/30/2014      PT End of Session - 05/30/14 1705    Visit Number 3   Number of Visits 12   Date for PT Re-Evaluation 06/22/14   PT Start Time 4081   PT Stop Time 1719   PT Time Calculation (min) 55 min   Activity Tolerance Patient tolerated treatment well   Behavior During Therapy [facility] for tasks assessed/performed      No past medical history on file.  No past surgical history on file.  There were no vitals taken for this visit.  Visit Diagnosis:  Neck pain      Subjective Assessment - 05/30/14 1626    Symptoms Reports pain was about a 4/10 over the weekend. Says everything progressivly got better over the weekend, says she could turn her head more but still has difficulty with looking down.    Currently in Pain? Yes   Pain Score 3    Pain Location Neck  Upper trap   Pain Orientation Left       TODAY'S TREATMENT: TherEx - NuStep LVL 3 x 5' Corner pec stretch 3x20" Hooklying on 1/2 Foam Roll: T-spine extension and pec stretch 2', Pullover without wt 12x, B ER Red TB 10x, B Horiz ABD Red TB 10x. Seated rows Red TB 10x Seated thoracic extension over 6" foam roll x10 at 3 different levels.  Manual Therapy - L UE nerve glides. TPR L UT with stretch to same. Minor STM to Rt UT due to tightness and slight cramping noted after Lt UT TPR. Gentle cervical SB and Rotation stretches. STM to Lt cervical paraspinals               [facility] Adult PT Treatment/Exercise - 05/30/14 0001    Modalities   Modalities Traction   Traction   Type of Traction Cervical   Min (lbs) 6   Max (lbs) 12   Hold Time 60   Rest Time 20   Time 15  approx 20 degree  pull                 PT Short Term Goals - 05/25/14 1622    PT SHORT TERM GOAL #1   Title pt independent with advanced HEP by 05/18/14   Status Achieved           PT Long Term Goals - 05/25/14 1622    PT LONG TERM GOAL #1   Title independent with advanced HEP as necessary by 06/22/14   Status On-going   PT LONG TERM GOAL #2   Title pt displays / verbalizes understanding of improved posture/body mechanics by 06/22/14   Status On-going   PT LONG TERM GOAL #3   Title pt able to perform all chores and drive without limitation by neck or upper back pain by 06/22/14   Status On-going   PT LONG TERM GOAL #4   Title pt reports headache frequency decreases to prior level of function by 06/22/14   Status On-going   PT LONG TERM GOAL #5   Title pt able to perform all work duties without limitation by neck or upper back pain by 06/22/14   Status On-going  Plan - 05/30/14 1705    Clinical Impression Statement Pt noted no cold sensations in her arm but did have some stiffness in her Rt upper trap while I was performing TPR to the Lt UT. Pulling sensation reported with Lt UE nerve glides but patient tolerated well. Very tight pecs noted with corner stretch.    Pt will benefit from skilled therapeutic intervention in order to improve on the following deficits Decreased activity tolerance;Decreased range of motion;Improper body mechanics;Impaired UE functional use;Decreased strength;Decreased mobility   Rehab Potential Good   PT Next Visit Plan Continue with nerve glides, needling, and postural exercises including pec stretch   Consulted and Agree with Plan of Care Patient        Problem List There are no active problems to display for this patient.   Barbette Hair, PTA 05/30/2014, 5:10 PM  [facility] [address]  [place], [place], [address] Phone: [phone]   Fax:   [phone]

## 2014-06-01 ENCOUNTER — Encounter: Payer: Self-pay | Admitting: Physical Therapy

## 2014-06-01 ENCOUNTER — Ambulatory Visit: Payer: 59 | Attending: Sports Medicine | Admitting: Physical Therapy

## 2014-06-01 DIAGNOSIS — M6528 Calcific tendinitis, other site: Secondary | ICD-10-CM | POA: Diagnosis not present

## 2014-06-01 DIAGNOSIS — M542 Cervicalgia: Secondary | ICD-10-CM | POA: Diagnosis present

## 2014-06-01 DIAGNOSIS — M546 Pain in thoracic spine: Secondary | ICD-10-CM | POA: Diagnosis not present

## 2014-06-01 NOTE — Therapy (Signed)
[name] [address]  [place], [place], [address] Phone: [phone]   Fax:  [phone]  Physical Therapy Treatment  Patient Details  Name: Joanne Wilson MRN: [id number] Date of Birth: 11-03-1970 Referring Provider:  Berle Mull, MD  Encounter Date: 06/01/2014      PT End of Session - 06/01/14 1630    Visit Number 4   Number of Visits 12   Date for PT Re-Evaluation 06/22/14   PT Start Time 1626   PT Stop Time 1721   PT Time Calculation (min) 55 min      History reviewed. No pertinent past medical history.  History reviewed. No pertinent past surgical history.  There were no vitals taken for this visit.  Visit Diagnosis:  Neck pain      Subjective Assessment - 06/01/14 1629    Symptoms used TENS unit last night with benefit.  States last treatment went well and seemed to feel better following treatment.   Currently in Pain? Yes   Pain Score 1    Pain Location --  L neck and into scapula   Multiple Pain Sites No            TODAY'S TREATMENT: TherEx - UBE lvl 1.0 1'/1' Corner pec stretch 3x20" Hooklying on Foam Roll: T-spine extension and pec stretch 2', Pullover 3# 12x, B ER Red TB 10x, B Horiz ABD Green TB 12x, D2 Flexion Red TB 10x. Seated Low Row 25# 2x12  Manual Therapy - L UE nerve glides. Prone PA to mid and upper t-spine grade 3 (2 cavitations noted, uncomfortable for pt, fair to good overall mobility).       [facility] Adult PT Treatment/Exercise - 06/01/14 0001    Modalities   Modalities Traction   Traction   Type of Traction Cervical  angle decreased to 15 degrees due to pt c/o headache   Min (lbs) 6   Max (lbs) 12   Hold Time 60   Rest Time 20   Time 15                  PT Short Term Goals - 05/25/14 1622    PT SHORT TERM GOAL #1   Title pt independent with advanced HEP by 05/18/14   Status Achieved           PT Long Term Goals - 05/25/14 1622    PT  LONG TERM GOAL #1   Title independent with advanced HEP as necessary by 06/22/14   Status On-going   PT LONG TERM GOAL #2   Title pt displays / verbalizes understanding of improved posture/body mechanics by 06/22/14   Status On-going   PT LONG TERM GOAL #3   Title pt able to perform all chores and drive without limitation by neck or upper back pain by 06/22/14   Status On-going   PT LONG TERM GOAL #4   Title pt reports headache frequency decreases to prior level of function by 06/22/14   Status On-going   PT LONG TERM GOAL #5   Title pt able to perform all work duties without limitation by neck or upper back pain by 06/22/14   Status On-going               Plan - 06/01/14 1713    Clinical Impression Statement continued onset of symptoms with L UE median nerve glides but ulnar and radial fine.  Tolerated treatment well without c/o pain but  reports that she experiences L headache while on traction.  States she has noted this each treatment but has not mentioned due to feeling benefit of traction.  Decreased angle of pull from 20 to 15 degrees and this helped but HA still present.  She states HA is gone once traction is over.   PT Next Visit Plan Continue with nerve glides, needling, and postural exercises   Consulted and Agree with Plan of Care Patient        Problem List There are no active problems to display for this patient.   Roslin Norwood PT, OCS 06/01/2014, 5:26 PM  [facility] [address]  [place], [place], [address] Phone: [phone]   Fax:  [phone]

## 2014-06-06 ENCOUNTER — Ambulatory Visit: Payer: 59 | Admitting: Rehabilitation

## 2014-06-06 DIAGNOSIS — M542 Cervicalgia: Secondary | ICD-10-CM

## 2014-06-06 NOTE — Therapy (Signed)
[facility] [address]  [place], [place], [address] Phone: [phone]   Fax:  [phone]  Physical Therapy Treatment  Patient Details  Name: Joanne Wilson MRN: [id number] Date of Birth: [birth year] Referring Provider:  Berle Mull, MD  Encounter Date: 06/06/2014      PT End of Session - 06/06/14 1625    Visit Number 5   Number of Visits 12   Date for PT Re-Evaluation 06/22/14   PT Start Time 5643   PT Stop Time 1719   PT Time Calculation (min) 55 min   Activity Tolerance Patient tolerated treatment well   Behavior During Therapy [facility] for tasks assessed/performed      No past medical history on file.  No past surgical history on file.  There were no vitals taken for this visit.  Visit Diagnosis:  Neck pain      Subjective Assessment - 06/06/14 1625    Symptoms Reports pain was good over the weekend with just a slight headache that resolved with celebrex. No pain today.    Currently in Pain? No/denies          [name] PT Assessment - 06/06/14 1647    ROM / Strength   AROM / PROM / Strength AROM   AROM   AROM Assessment Site Cervical   Cervical Flexion WFL  upper back pulling   Cervical Extension 45   Cervical - Right Side Bend 30   Cervical - Left Side Bend 30   Cervical - Right Rotation 70   Cervical - Left Rotation 63          TODAY'S TREATMENT: TherEx - UBE lvl 1.0 90"/90" Corner pec stretch 3x20" Corner scapular retraction x15 Hooklying on Foam Roll: T-spine extension and pec stretch 2', B ER Red TB 10x, Pullover 3# 15x, Protraction 3# 10x, D2 Flexion Red TB 10x.  Manual Therapy - L UE nerve glides, STM to bilateral cervical paraspinals then SOR to tolerance.           [name] - 06/06/14 1701    Traction   Type of Traction Cervical   Min (lbs) 6   Max (lbs) 12   Hold Time 60   Rest Time 20   Time 15  10-15 degree pull                  PT Short Term Goals - 05/25/14 1622    PT SHORT TERM GOAL #1   Title pt independent with advanced HEP by 05/18/14   Status Achieved           PT Long Term Goals - 05/25/14 1622    PT LONG TERM GOAL #1   Title independent with advanced HEP as necessary by 06/22/14   Status On-going   PT LONG TERM GOAL #2   Title pt displays / verbalizes understanding of improved posture/body mechanics by 06/22/14   Status On-going   PT LONG TERM GOAL #3   Title pt able to perform all chores and drive without limitation by neck or upper back pain by 06/22/14   Status On-going   PT LONG TERM GOAL #4   Title pt reports headache frequency decreases to prior level of function by 06/22/14   Status On-going   PT LONG TERM GOAL #5   Title pt able to perform all work duties without limitation by neck or upper back pain by 06/22/14   Status On-going  Plan - 06/06/14 1706    Clinical Impression Statement Patient more tender along cervical paraspinals and with SOR, believe this could have caused the headache over the weekend. Still pain/symptoms with median nerve glides. Patient noted some low back discomfort with foam roll activities but tolerated exercises well. Patient also reported improvement with 10-15 degree angle during traction. Overall, patient stated that she feels therapy is helping with her pain levels.  Slight improvements noted with ROM measurements.    PT Next Visit Plan Continue with postural exericses, nerve glides, manual as needed.    Consulted and Agree with Plan of Care Patient        Problem List There are no active problems to display for this patient.   Barbette Hair, PTA 06/06/2014, 5:13 PM  [facility] [address]  [place], [place], [address] Phone: [phone]   Fax:  [phone]

## 2014-06-08 ENCOUNTER — Encounter: Payer: Self-pay | Admitting: Rehabilitation

## 2014-06-08 ENCOUNTER — Ambulatory Visit: Payer: 59 | Admitting: Rehabilitation

## 2014-06-08 DIAGNOSIS — M542 Cervicalgia: Secondary | ICD-10-CM

## 2014-06-08 NOTE — Patient Instructions (Signed)
Added child's pose to HEP

## 2014-06-08 NOTE — Therapy (Signed)
[facility] [address]  [place], [place], [address] Phone: [phone]   Fax:  [phone]  Physical Therapy Treatment  Patient Details  Name: Joanne Wilson MRN: [id number] Date of Birth: 1970/10/27 Referring Provider:  Berle Mull, MD  Encounter Date: 06/08/2014      PT End of Session - 06/08/14 1704    Visit Number 6   Number of Visits 12   Date for PT Re-Evaluation 06/22/14   PT Start Time 1630   PT Stop Time 1700   PT Time Calculation (min) 30 min   Activity Tolerance Patient tolerated treatment well  reports decreased stiffness after treatment      History reviewed. No pertinent past medical history.  History reviewed. No pertinent past surgical history.  There were no vitals taken for this visit.  Visit Diagnosis:  Neck pain        Subjective Assessment - 06/08/14 1628    Symptoms Just really stiff. No headache.  "I just don't have the movement in my shoulders" Reports bilateral shoulder stiffness. Difficulty reaching up overhead today at work.    Currently in Pain? Yes   Pain Score 4    Pain Location --  B UT region   Aggravating Factors  neck flexion, raising arms   Pain Relieving Factors therapy treatments, heat     TODAY'S TREATMENT: 60min late today  TherEx - UBE lvl 1.0 2'/2' Pulleys flexion for shoulder ROM x 105min Hooklying on Foam Roll: pec stretch 2', Pullover 3# 6"x12, B Horiz ABD Red TB 15x, D2 Flexion Red TB 15x. Child's pose 2x60" with tcs/demo for performance; added to HEP  Manual Therapy -Prone PA, B UPAs, screw mobilization, and scapulothoracic PA/inf glides t-spine grade IV to tolerance.  Supine manual UT stretches R 2x20". Seated thoracic extension work with segmental stabilization and over pressure, assisted thoracic rotation.  Pt reports that traction is mostly uncomfortable, so we held on this today.                          PT Short Term  Goals - 05/25/14 1622    PT SHORT TERM GOAL #1   Title pt independent with advanced HEP by 05/18/14   Status Achieved           PT Long Term Goals - 05/25/14 1622    PT LONG TERM GOAL #1   Title independent with advanced HEP as necessary by 06/22/14   Status On-going   PT LONG TERM GOAL #2   Title pt displays / verbalizes understanding of improved posture/body mechanics by 06/22/14   Status On-going   PT LONG TERM GOAL #3   Title pt able to perform all chores and drive without limitation by neck or upper back pain by 06/22/14   Status On-going   PT LONG TERM GOAL #4   Title pt reports headache frequency decreases to prior level of function by 06/22/14   Status On-going   PT LONG TERM GOAL #5   Title pt able to perform all work duties without limitation by neck or upper back pain by 06/22/14   Status On-going               Plan - 06/08/14 1706    Clinical Impression Statement Significant thoracic/scapulothoracic/and shoulder girdle stiffness bilaterally, some improvement after manual work.    PT Next Visit Plan continue with plan of care towards LTGs  Problem List There are no active problems to display for this patient.   Stark Bray , DPT, CMP  06/08/2014, 5:10 PM  [facility] [address]  [place], [place], [address] Phone: [phone]   Fax:  [phone]

## 2014-06-20 ENCOUNTER — Ambulatory Visit: Payer: 59 | Admitting: Physical Therapy

## 2014-06-20 ENCOUNTER — Other Ambulatory Visit (HOSPITAL_BASED_OUTPATIENT_CLINIC_OR_DEPARTMENT_OTHER): Payer: Self-pay | Admitting: Obstetrics and Gynecology

## 2014-06-20 DIAGNOSIS — Z1231 Encounter for screening mammogram for malignant neoplasm of breast: Secondary | ICD-10-CM

## 2014-06-20 DIAGNOSIS — M542 Cervicalgia: Secondary | ICD-10-CM

## 2014-06-20 NOTE — Therapy (Signed)
[facility] [address]  [place], [place], [address] Phone: [phone]   Fax:  [phone]  Physical Therapy Treatment  Patient Details  Name: ZYONA PETTAWAY MRN: [id number] Date of Birth: [birth year] Referring Provider:  Berle Mull, MD  Encounter Date: 06/20/2014      PT End of Session - 06/20/14 1546    Visit Number 7   Number of Visits 12   Date for PT Re-Evaluation 06/22/14   PT Start Time 1540   PT Stop Time 1623   PT Time Calculation (min) 43 min      No past medical history on file.  No past surgical history on file.  There were no vitals filed for this visit.  Visit Diagnosis:  Neck pain      Subjective Assessment - 06/20/14 1542    Symptoms shoulders feels stiff but overall is feeling better, states feels as though "hands on" treatments work best.  States is performing stretches.   Currently in Pain? Yes   Pain Score 3    Pain Location Neck   Pain Orientation Upper               TODAY'S TREATMENT:  Manual Therapy -Prone Grade 3 PA to mid and upper t-spine, Grade 2-3 PA to lower and mid c-spine, supine c-spine R rotation mobes grade 4, L 1st rib grade 3.  TPR B UT then dry needling due to limited progress with manual TPR (see below).            Trigger Point Dry Needling - 06/20/14 1640    Consent Given? Yes   Muscles Treated Upper Body Upper trapezius   Upper Trapezius Response Twitch reponse elicited  Bilateral    Decreased tone and tenderness noted following dry needling.            PT Short Term Goals - 05/25/14 1622    PT SHORT TERM GOAL #1   Title pt independent with advanced HEP by 05/18/14   Status Achieved           PT Long Term Goals - 06/20/14 1546    PT LONG TERM GOAL #1   Title independent with advanced HEP as necessary by 06/22/14   Status On-going   PT LONG TERM GOAL #2   Title pt displays / verbalizes understanding of improved  posture/body mechanics by 06/22/14   Status On-going   PT LONG TERM GOAL #3   Title pt able to perform all chores and drive without limitation by neck or upper back pain by 06/22/14  driving is "better" states has difficulty "sometimes"   Status On-going   PT LONG TERM GOAL #4   Title pt reports headache frequency decreases to prior level of function by 06/22/14  states headaches less frequent but still feel like related to neck   PT LONG TERM GOAL #5   Title pt able to perform all work duties without limitation by neck or upper back pain by 06/22/14  "about the same"   Status On-going               Plan - 06/20/14 1625    Clinical Impression Statement continued LOM c-spine and B shoulders related to soft tissue as well as joint stiffness throughout scapular and thoracic region.   Pt will benefit from skilled therapeutic intervention in order to improve on the following deficits Decreased activity tolerance;Decreased range of motion;Improper body mechanics;Impaired UE functional use;Decreased strength;Decreased  mobility   Rehab Potential Good   PT Treatment/Interventions Cryotherapy;Electrical Stimulation;Moist Heat;Therapeutic activities;Patient/family education;Therapeutic exercise;Dry needling;Manual techniques;Neuromuscular re-education;Traction   PT Next Visit Plan continue with plan of care towards LTGs   Consulted and Agree with Plan of Care Patient        Problem List There are no active problems to display for this patient.   Dalesha Stanback PT, OCS 06/20/2014, 5:11 PM  [facility] [address]  [place], [place], [address] Phone: [phone]   Fax:  [phone]

## 2014-06-22 ENCOUNTER — Ambulatory Visit: Payer: 59 | Admitting: Rehabilitation

## 2014-06-22 DIAGNOSIS — M542 Cervicalgia: Secondary | ICD-10-CM

## 2014-06-22 NOTE — Therapy (Signed)
[facility] [address]  [place], [place], [address] Phone: [phone]   Fax:  [phone]  Physical Therapy Treatment  Patient Details  Name: Joanne Wilson MRN: [id number] Date of Birth: Mar 14, 1971 Referring Provider:  Berle Mull, MD  Encounter Date: 06/22/2014      PT End of Session - 06/22/14 1451    Visit Number 8   Number of Visits 12   Date for PT Re-Evaluation 06/22/14   PT Start Time 5277   PT Stop Time 1525   PT Time Calculation (min) 37 min   Activity Tolerance Patient tolerated treatment well   Behavior During Therapy [facility] for tasks assessed/performed      No past medical history on file.  No past surgical history on file.  There were no vitals filed for this visit.  Visit Diagnosis:  Neck pain      Subjective Assessment - 06/22/14 1450    Symptoms Still notes neck and shoudler pain, expecially with rolling neck side to side.    Currently in Pain? Yes   Pain Score 3    Pain Location Neck   Pain Orientation Upper       TODAY'S TREATMENT: TherEx -  UBE level 1.0 x 2' fwd/2' bwd Hooklying on 1/2 Foam Roll: T-spine extension and pec stretch 2', Pullover without wt 10x, B ER Yellow TB 10x, B Horiz ABD AROM/Stretch 10x.  Manual Therapy -  TPR L UT then stretching to the same 3x20".  STM to bil cervical paraspinals. Gentle PROM with stretches into rotation and side-bending to pt tolerance. SOR x3'.          PT Short Term Goals - 05/25/14 1622    PT SHORT TERM GOAL #1   Title pt independent with advanced HEP by 05/18/14   Status Achieved           PT Long Term Goals - 06/20/14 1546    PT LONG TERM GOAL #1   Title independent with advanced HEP as necessary by 06/22/14   Status On-going   PT LONG TERM GOAL #2   Title pt displays / verbalizes understanding of improved posture/body mechanics by 06/22/14   Status On-going   PT LONG TERM GOAL #3   Title pt able to perform all  chores and drive without limitation by neck or upper back pain by 06/22/14  driving is "better" states has difficulty "sometimes"   Status On-going   PT LONG TERM GOAL #4   Title pt reports headache frequency decreases to prior level of function by 06/22/14  states headaches less frequent but still feel like related to neck   PT LONG TERM GOAL #5   Title pt able to perform all work duties without limitation by neck or upper back pain by 06/22/14  "about the same"   Status On-going               Plan - 06/22/14 1526    Clinical Impression Statement Limited tolerance to exercises today but notes relief with manual work.    PT Next Visit Plan Continue   Consulted and Agree with Plan of Care Patient        Problem List There are no active problems to display for this patient.   Barbette Hair, PTA 06/22/2014, 3:28 PM  [facility] [address]  [place], [place], [address] Phone: [phone]   Fax:  [phone]

## 2014-06-27 ENCOUNTER — Ambulatory Visit: Payer: 59 | Admitting: Rehabilitation

## 2014-06-27 DIAGNOSIS — M542 Cervicalgia: Secondary | ICD-10-CM | POA: Diagnosis not present

## 2014-06-27 NOTE — Therapy (Signed)
[facility] [address]  [place], [place], [address] Phone: [phone]   Fax:  [phone]  Physical Therapy Treatment  Patient Details  Name: Joanne Wilson MRN: [id number] Date of Birth: Jul 09, 1970 Referring Provider:  Berle Mull, MD  Encounter Date: 06/27/2014      PT End of Session - 06/27/14 1658    Visit Number 9   Number of Visits 12   Date for PT Re-Evaluation 06/22/14   PT Start Time 1700   PT Stop Time 1742   PT Time Calculation (min) 42 min      No past medical history on file.  No past surgical history on file.  There were no vitals filed for this visit.  Visit Diagnosis:  Neck pain      Subjective Assessment - 06/27/14 1702    Symptoms Reports headache and tightness over the weekend. Feeling just tightness now.    Currently in Pain? No/denies      TODAY'S TREATMENT: TherEx -  UBE level 1.0 x 2' fwd/2' bwd Corner stretch 2x30" Scap retract in corner x10 Hooklying on 1/2 Foam Roll: T-spine extension and pec stretch 2', Pullover 4# 10x, alternating horizontal abduction with 2# x10.  Manual Therapy -  Supine TPR to bilateral UT then stretching to the same 3x20". STM to bil cervical paraspinals. SOR x3' Prone STM to bilateral thoracic paraspinals with noted knot on the Rt side.           PT Short Term Goals - 05/25/14 1622    PT SHORT TERM GOAL #1   Title pt independent with advanced HEP by 05/18/14   Status Achieved           PT Long Term Goals - 06/20/14 1546    PT LONG TERM GOAL #1   Title independent with advanced HEP as necessary by 06/22/14   Status On-going   PT LONG TERM GOAL #2   Title pt displays / verbalizes understanding of improved posture/body mechanics by 06/22/14   Status On-going   PT LONG TERM GOAL #3   Title pt able to perform all chores and drive without limitation by neck or upper back pain by 06/22/14  driving is "better" states has difficulty  "sometimes"   Status On-going   PT LONG TERM GOAL #4   Title pt reports headache frequency decreases to prior level of function by 06/22/14  states headaches less frequent but still feel like related to neck   PT LONG TERM GOAL #5   Title pt able to perform all work duties without limitation by neck or upper back pain by 06/22/14  "about the same"   Status On-going               Plan - 06/27/14 1742    Clinical Impression Statement Continues to note pain with exercises and cerivcal rotation motions. Very tight/tender thoracic spine spine today as well as bilateral upper traps.    PT Next Visit Plan Renewal? Continue with manual and foam roller exercises.    Consulted and Agree with Plan of Care Patient        Problem List There are no active problems to display for this patient.   Barbette Hair, PTA 06/27/2014, 5:50 PM  [facility] [address]  [place], [place], [address] Phone: [phone]   Fax:  [phone]

## 2014-06-29 ENCOUNTER — Ambulatory Visit: Payer: 59

## 2014-06-29 ENCOUNTER — Ambulatory Visit: Payer: 59 | Admitting: Rehabilitation

## 2014-06-29 DIAGNOSIS — M542 Cervicalgia: Secondary | ICD-10-CM

## 2014-06-29 NOTE — Therapy (Signed)
[facility] [address]  [place], [place], [address] Phone: [phone]   Fax:  [phone]  Physical Therapy Treatment  Patient Details  Name: Joanne Wilson MRN: [id number] Date of Birth: 1970/11/05 Referring Provider:  Berle Mull, MD  Encounter Date: 06/29/2014      PT End of Session - 06/29/14 1606    Visit Number 10   Number of Visits 12   Date for PT Re-Evaluation 06/22/14   PT Start Time 8315   PT Stop Time 1650   PT Time Calculation (min) 44 min      No past medical history on file.  No past surgical history on file.  There were no vitals filed for this visit.  Visit Diagnosis:  Neck pain      Subjective Assessment - 06/29/14 1607    Symptoms "Feels about the same." Reports doing fine after last time, notes mainly stiffness.    Currently in Pain? No/denies         TODAY'S TREATMENT: TherEx -  UBE level 2.0 x 2' fwd/2' bwd Doorway stretch (low, mid, high) x20" each Low row 25# 2x10 then alternating 15# x10 each Standing with 1/2 Foam Roll along spine: Rows green TB x10, Bil horizontal Abd green TB x10 Side-Lying open book/horizontal abd 3" hold x10  ROM check  Manual Therapy -  Supine TPR to bilateral UT then stretching to the same 3x20". STM to bil cervical paraspinals. SOR x3' Prone STM to bilateral thoracic paraspinals with noted knot on the Rt side.            PT Short Term Goals - 05/25/14 1622    PT SHORT TERM GOAL #1   Title pt independent with advanced HEP by 05/18/14   Status Achieved           PT Long Term Goals - 06/20/14 1546    PT LONG TERM GOAL #1   Title independent with advanced HEP as necessary by 06/22/14   Status On-going   PT LONG TERM GOAL #2   Title pt displays / verbalizes understanding of improved posture/body mechanics by 06/22/14   Status On-going   PT LONG TERM GOAL #3   Title pt able to perform all chores and drive without limitation by  neck or upper back pain by 06/22/14  driving is "better" states has difficulty "sometimes"   Status On-going   PT LONG TERM GOAL #4   Title pt reports headache frequency decreases to prior level of function by 06/22/14  states headaches less frequent but still feel like related to neck   PT LONG TERM GOAL #5   Title pt able to perform all work duties without limitation by neck or upper back pain by 06/22/14  "about the same"   Status On-going               Plan - 06/29/14 1654    Clinical Impression Statement Pt reported less tightness after STM to t-spine/shoulder blade area. Upper traps continue to be tight/tender. Not much difference with ROM seen today.   PT Next Visit Plan Renewal? Continue with manual and foam roller exercises.    Consulted and Agree with Plan of Care Patient        Problem List There are no active problems to display for this patient.   Barbette Hair, PTA 06/29/2014, 4:56 PM  [facility] [place]  [place], [place], [address] Phone: [phone]   Fax:  [phone]

## 2014-07-04 ENCOUNTER — Ambulatory Visit (HOSPITAL_BASED_OUTPATIENT_CLINIC_OR_DEPARTMENT_OTHER)
Admission: RE | Admit: 2014-07-04 | Discharge: 2014-07-04 | Disposition: A | Discharge status: Home / Self Care | Payer: 59 | Source: Ambulatory Visit | Attending: Obstetrics and Gynecology | Admitting: Obstetrics and Gynecology

## 2014-07-04 ENCOUNTER — Ambulatory Visit (HOSPITAL_BASED_OUTPATIENT_CLINIC_OR_DEPARTMENT_OTHER): Payer: 59

## 2014-07-04 ENCOUNTER — Ambulatory Visit: Payer: 59 | Attending: Sports Medicine | Admitting: Physical Therapy

## 2014-07-04 DIAGNOSIS — Z1231 Encounter for screening mammogram for malignant neoplasm of breast: Secondary | ICD-10-CM | POA: Diagnosis present

## 2014-07-04 DIAGNOSIS — M542 Cervicalgia: Secondary | ICD-10-CM | POA: Diagnosis present

## 2014-07-04 DIAGNOSIS — M6528 Calcific tendinitis, other site: Secondary | ICD-10-CM | POA: Insufficient documentation

## 2014-07-04 DIAGNOSIS — M546 Pain in thoracic spine: Secondary | ICD-10-CM | POA: Insufficient documentation

## 2014-07-04 NOTE — Therapy (Signed)
[facility] [address]  [place], [place], [address] Phone: [phone]   Fax:  [phone]  Physical Therapy Treatment  Patient Details  Name: Joanne Wilson MRN: [id number] Date of Birth: 20-Nov-1970 Referring Provider:  Berle Mull, MD  Encounter Date: 07/04/2014      PT End of Session - 07/04/14 0901    Visit Number 11   Number of Visits 12   Date for PT Re-Evaluation 06/22/14   PT Start Time 0856   PT Stop Time 0950   PT Time Calculation (min) 54 min      No past medical history on file.  No past surgical history on file.  There were no vitals filed for this visit.  Visit Diagnosis:  Neck pain      Subjective Assessment - 07/04/14 0858    Subjective Pt 10' late again.  Worked in the yard some this weekend but states noted increased neck and shoulder pain later that night which she rated up to 5/10.  She denied noting increased pain while working in the yard. States N/T is "definately getting better" stating is still present but "not as much as when I started".  She states she is set to begin chiropractic care next week.   Currently in Pain? Yes   Pain Score 3    Pain Location Back   Pain Orientation Upper;Left;Right            [facility] PT Assessment - 07/04/14 0001    ROM / Strength   AROM / PROM / Strength AROM   AROM   Overall AROM Comments B Shoulder AROM Flexion to 125 with c/o superior shoulder/scapular pain and ABD to 150 with c/o B inferior scapular pain   AROM Assessment Site Cervical   Cervical Extension 36   Cervical - Right Side Bend 38   Cervical - Left Side Bend 30  "just won't go anymore"   Cervical - Right Rotation 65  L lateral neck pain   Cervical - Left Rotation 54  L lower lateral neck pain        TODAY'S TREATMENT: AROM assessment (neck and shoulders) Manual Therapy -Prone Grade 3 PA to mid and upper t-spine  TherEx -  UBE level 2.0 x 1'/1' Prone superman  10x2" POE chin tuck 10x5", B c-spine rotation AROM with chin tuck 10x each (states unable to stay in POE after these due to B shoulders "too weak" and getting sore) Standing Low row Black TB 15x Standing ER Green TB 15x R and 10x L (c/o R scapular pain with L ER)  Instruct and review HEP update                PT Education - 07/04/14 0957    Education provided Yes   Education Details HEP update   Person(s) Educated Patient   Methods Explanation;Demonstration;Handout   Comprehension Returned demonstration;Verbalized understanding          PT Short Term Goals - 05/25/14 1622    PT SHORT TERM GOAL #1   Title pt independent with advanced HEP by 05/18/14   Status Achieved           PT Long Term Goals - 07/04/14 0910    PT LONG TERM GOAL #4   Title pt reports headache frequency decreases to prior level of function by 06/22/14  states "definately better" nowhere as severe but still some   Status On-going  Plan - 07/04/14 0915    Clinical Impression Statement pt with no improvement to c-spine AROM - actually worse in most planes today vs initial assessment.  She states she feels stiff today due to yardwork.  She c/o B shoulder fatigue with POE exercises today.  Overall, pt's c/o symptoms include neck stiffness, L-sided neck pain with AROM, B shoulder LOM due to c/o scapular pain (inferior and superior aspect), and B shoulder/scapular pain and weakness in general with most activities.  Questional improvement to date - pt denies imrprovemnt at times but reports benefit at others.  Hopefully chiropractic care beginning next week will help improve ROM and we can focus more on strength and function.   PT Next Visit Plan finish re-assessment for likely renewal.   Consulted and Agree with Plan of Care Patient        Problem List There are no active problems to display for this patient.   Frankye Schwegel PT, OCS 07/04/2014, 9:59 AM  [facility] [address]  [place], [place], [address] Phone: [phone]   Fax:  [phone]

## 2014-07-06 ENCOUNTER — Ambulatory Visit: Payer: 59 | Admitting: Rehabilitation

## 2014-07-11 ENCOUNTER — Ambulatory Visit: Payer: 59 | Admitting: Rehabilitation

## 2014-07-11 DIAGNOSIS — M542 Cervicalgia: Secondary | ICD-10-CM

## 2014-07-11 NOTE — Therapy (Signed)
[facility] [address]  [place], [place], [address] Phone: [phone]   Fax:  [phone]  Physical Therapy Treatment  Patient Details  Name: Joanne Wilson MRN: [id number] Date of Birth: 1970-04-08 Referring Provider:  Berle Mull, MD  Encounter Date: 07/11/2014      PT End of Session - 07/11/14 1710    Visit Number 12   Number of Visits 12   Date for PT Re-Evaluation 06/22/14   PT Start Time 1706  PTA running late   PT Stop Time 1745   PT Time Calculation (min) 39 min   Activity Tolerance Patient tolerated treatment well   Behavior During Therapy [facility] for tasks assessed/performed      No past medical history on file.  No past surgical history on file.  There were no vitals filed for this visit.  Visit Diagnosis:  Neck pain      Subjective Assessment - 07/11/14 1709    Subjective Went to the first chiropractor appointment today, reports he mainly did x-rays and seeing where pain is. Goes back Wednesday. States she feels like shoulder exercises are helping. Noted some swelling near her neck over the weekend, unsure why. Also notes a burning sensation near her Lt shoulder blade.   Currently in Pain? Yes   Pain Score 3    Pain Location --  upper back      TODAY'S TREATMENT:  TherEx -  UBE level 2.0 x 2'/2' Prone scapular protraction/retraction 10x3" Prone superman 10x3" POE B c-spine rotation AROM with chin tuck 10x2" each Standing Bil Ext Green TB 15x Standing ER Green TB 15x Bil  Standing IR Green TB 15x Bil (Lt harder) Wall push ups x10 Corner stretch 3x20"         PT Short Term Goals - 05/25/14 1622    PT SHORT TERM GOAL #1   Title pt independent with advanced HEP by 05/18/14   Status Achieved           PT Long Term Goals - 07/04/14 0910    PT LONG TERM GOAL #4   Title pt reports headache frequency decreases to prior level of function by 06/22/14  states "definately  better" nowhere as severe but still some   Status On-going               Plan - 07/11/14 1744    Clinical Impression Statement Pt reported improvements with shoulder focus exercises. Pt is also looking forward to having chiropractor work on ROM while PT works on strength and stability.    PT Next Visit Plan finish re-assessment for likely renewal.   Consulted and Agree with Plan of Care Patient        Problem List There are no active problems to display for this patient.   Barbette Hair, PTA 07/11/2014, 5:49 PM  [facility] [address]  [place], [place], [address] Phone: [phone]   Fax:  [phone]

## 2014-07-13 ENCOUNTER — Ambulatory Visit: Payer: 59 | Admitting: Physical Therapy

## 2014-07-18 ENCOUNTER — Ambulatory Visit: Payer: 59 | Admitting: Rehabilitation

## 2014-07-18 DIAGNOSIS — M542 Cervicalgia: Secondary | ICD-10-CM | POA: Diagnosis not present

## 2014-07-18 NOTE — Therapy (Signed)
[facility] [address]  [place], [place], [address] Phone: [phone]   Fax:  [phone]  Physical Therapy Treatment  Patient Details  Name: Joanne Wilson MRN: [id number] Date of Birth: November 02, 1970 Referring Provider:  Berle Mull, MD  Encounter Date: 07/18/2014      PT End of Session - 07/18/14 1654    Visit Number 13   Number of Visits 12   Date for PT Re-Evaluation 06/22/14   PT Start Time 4742   PT Stop Time 1735   PT Time Calculation (min) 40 min      No past medical history on file.  No past surgical history on file.  There were no vitals filed for this visit.  Visit Diagnosis:  Neck pain      Subjective Assessment - 07/18/14 1657    Subjective Reports she has been to the chiropractor for 3 appointments now and that he is relieving the pressure. Worked in the yard over the weekend which tightened everything up. Still gets the pulling sensation in her left shoulder when she tries to move her arm normally. Reports numbness has been improving since treatment with Chiropractor and that she has periods of no numbness after those treatments/releasing pressure. Numbness caused by sleeping on her Lt side or having her arms hanging to the side while she is prone or supine.    Pain Score --  At worst: 6/10    Pain Location --  neck, low back, shoulders       Today's Treatment     [facility] PT Assessment - 07/18/14 1703    ROM / Strength   AROM / PROM / Strength AROM   AROM   Overall AROM Comments B shoulder flexion 135   AROM Assessment Site Cervical   Cervical Flexion [facility]   Cervical Extension 43   Cervical - Right Side Bend 34   Cervical - Left Side Bend 28   Cervical - Right Rotation 70   Cervical - Left Rotation 65      TherEx -  UBE level 2.0 x 2'/2' ROM/Goal check Standing bilateral ER Green TB 15x Standing bilateral abduction Green TB 10x Corner stretch 3x20" (improved tolerance)   Prone scapular protraction/retraction 10x3" Prone superman 10x3" Prone off edge of plinth: T 0# 10x, Bent T 0# 10x, Y 0# 10x Wall push ups 10x Thoracic self mob with foam roll horizontal 5x at various levels         PT Short Term Goals - 05/25/14 1622    PT SHORT TERM GOAL #1   Title pt independent with advanced HEP by 05/18/14   Status Achieved           PT Long Term Goals - 07/18/14 1707    PT LONG TERM GOAL #1   Title independent with advanced HEP as necessary by 06/22/14   Status On-going   PT LONG TERM GOAL #2   Title pt displays / verbalizes understanding of improved posture/body mechanics by 06/22/14   Status On-going   PT LONG TERM GOAL #3   Title pt able to perform all chores and drive without limitation by neck or upper back pain by 06/22/14   Limits driving due to pain with ROM   Status On-going   PT LONG TERM GOAL #4   Title pt reports headache frequency decreases to prior level of function by 06/22/14  Feels this is  now back to normal.    Status  Achieved   PT LONG TERM GOAL #5   Title pt able to perform all work duties without limitation by neck or upper back pain by 06/22/14   Status On-going               Plan - 07/18/14 1740    Clinical Impression Statement Improved motion noted with cervical and shoulder motions today. Pt feeling like she has more motion in regards to her cervical motion. Pt also stated that her headaches are back to a baseline level. Good tolerance to prone off plinth exercise and improved tolerance with corner stretch.    PT Next Visit Plan Continue with thoracic and scapular strengthening,    Consulted and Agree with Plan of Care Patient        Problem List There are no active problems to display for this patient.   Barbette Hair, PTA 07/19/2014, 9:09 AM  [facility] [address]  [place], [place], [address] Phone: [phone]   Fax:   [phone]

## 2014-07-20 ENCOUNTER — Ambulatory Visit: Payer: 59 | Admitting: Rehabilitation

## 2014-07-25 ENCOUNTER — Ambulatory Visit: Payer: 59 | Admitting: Physical Therapy

## 2014-07-25 DIAGNOSIS — M542 Cervicalgia: Secondary | ICD-10-CM | POA: Diagnosis not present

## 2014-07-25 NOTE — Therapy (Signed)
[facility] [address]  [place], [place], [address] Phone: [phone]   Fax:  [phone]  Physical Therapy Treatment  Patient Details  Name: Joanne Wilson MRN: [id number] Date of Birth: 1971-01-04 Referring Provider:  Berle Mull, MD  Encounter Date: 07/25/2014      PT End of Session - 07/25/14 1116    Visit Number 14   Number of Visits 19   Date for PT Re-Evaluation 08/15/14   PT Start Time 1110   PT Stop Time 1159   PT Time Calculation (min) 49 min      No past medical history on file.  No past surgical history on file.  There were no vitals filed for this visit.  Visit Diagnosis:  Neck pain - Plan: PT plan of care cert/re-cert      Subjective Assessment - 07/25/14 1108    Subjective States chiropractor appointments are going well and she has changed pillows and notes improvement with this as well.  She states neck ROM seems better.  c/o noting L upper scapular catch/pull with L UE use and reaching.  States notes some increased pain/soreness due to moving some boxes yesterday.  Continues to note intermittent L UE N/T but states is improving.  (Seems to be most noted with shoulder extension.)  Is performing HEP (chest, neck, lat stretches; RC and scapular retraction exercises) most days of the week.   Currently in Pain? Yes   Pain Score 3    Pain Location --  B upper scapula and into lower c-spine   Multiple Pain Sites No            [facility] PT Assessment - 07/25/14 0001    AROM   Cervical Extension 56  upper, central c-spine pain   Cervical - Right Side Bend 30  L pain   Cervical - Left Side Bend 30  R pain   Cervical - Right Rotation 72   Cervical - Left Rotation 57  L lower neck pain   Special Tests    Special Tests Cervical   Cervical Tests other   other    Findings Positive   Side Left   Comment POS B ULTT median nerve bias (L worse)         TODAY'S TREATMENT PT  Re-Eval TherEx - single hand low row Black TB 12x each UE nerve glides  Mechanical Traction - c-spine, 20dg pull, 8#/3#, 60"/20", 10'                    PT Education - 07/25/14 1151    Education provided Yes   Education Details HEP update   Person(s) Educated Patient   Methods Explanation;Demonstration;Handout   Comprehension Returned demonstration;Verbalized understanding          PT Short Term Goals - 05/25/14 1622    PT SHORT TERM GOAL #1   Title pt independent with advanced HEP by 05/18/14   Status Achieved           PT Long Term Goals - 07/25/14 1117    PT LONG TERM GOAL #1   Title independent with advanced HEP as necessary by 08/15/14   Status On-going   PT LONG TERM GOAL #2   Title pt displays / verbalizes understanding of improved posture/body mechanics   Status Achieved   PT LONG TERM GOAL #3   Title pt able to perform all chores and drive without limitation by neck or upper  back pain by 08/15/14   driving is fine, intermittent difficulty with chores due to pain and N/T   Status Partially Met   PT LONG TERM GOAL #4   Title pt reports headache frequency decreases to prior level of function   Status Achieved   PT LONG TERM GOAL #5   Title pt able to perform all work duties without limitation by neck or upper back pain by 08/15/14  states is able to perform all duties but then states "I might pay for it when I get home"   Status Partially Met               Plan - 07/25/14 1154    Clinical Impression Statement pt with continued LOM into L c-spine rotation and intermittent radicular symptoms into L UE.  She has made some progress but still reports limited level of function.  We will return to traction per pt and her chiropractor's request (poorly tolerated in the past) and progress scapular stability and neck ROM exercises here.  Recommend continued PT for 2x/wk for 3 weeks for a total of 19 treatments.   Pt will benefit from skilled  therapeutic intervention in order to improve on the following deficits Decreased activity tolerance;Decreased range of motion;Improper body mechanics;Impaired UE functional use;Decreased strength;Decreased mobility   Rehab Potential Good   PT Frequency 2x / week   PT Duration 3 weeks   PT Treatment/Interventions Cryotherapy;Electrical Stimulation;Moist Heat;Therapeutic activities;Patient/family education;Therapeutic exercise;Dry needling;Manual techniques;Neuromuscular re-education;Traction   PT Next Visit Plan Continue with thoracic and scapular strengthening, mechanical traction as tolerated   Consulted and Agree with Plan of Care Patient        Problem List There are no active problems to display for this patient.   Cecylia Brazill PT, OCS 07/25/2014, 12:00 PM  [facility] [address]  [place], [place], [address] Phone: [phone]   Fax:  [phone]

## 2014-07-27 ENCOUNTER — Ambulatory Visit: Payer: 59 | Admitting: Rehabilitation

## 2014-07-27 DIAGNOSIS — M542 Cervicalgia: Secondary | ICD-10-CM

## 2014-07-27 NOTE — Therapy (Signed)
[facility] [address]  [place], [place], [address] Phone: [phone]   Fax:  [phone]  Physical Therapy Treatment  Patient Details  Name: Joanne Wilson MRN: [id number] Date of Birth: 1970/12/06 Referring Provider:  Berle Mull, MD  Encounter Date: 07/27/2014      PT End of Session - 07/27/14 1706    Visit Number 15   Number of Visits 19   Date for PT Re-Evaluation 08/15/14   PT Start Time 1703   PT Stop Time 1732   PT Time Calculation (min) 29 min      No past medical history on file.  No past surgical history on file.  There were no vitals filed for this visit.  Visit Diagnosis:  Neck pain      Subjective Assessment - 07/27/14 1705    Subjective Reports she feels tight due to not going to chiropractor this morning.    Currently in Pain? No/denies      TODAY'S TREATMENT TherEx - UBE level 2.5 2'/2' Corner stretch 3x20" TRX rows x10  single hand low row Black TB 12x each  Mechanical Traction - c-spine, 20dg pull, 8#/3#, 60"/20", 10'        PT Short Term Goals - 05/25/14 1622    PT SHORT TERM GOAL #1   Title pt independent with advanced HEP by 05/18/14   Status Achieved           PT Long Term Goals - 07/25/14 1117    PT LONG TERM GOAL #1   Title independent with advanced HEP as necessary by 08/15/14   Status On-going   PT LONG TERM GOAL #2   Title pt displays / verbalizes understanding of improved posture/body mechanics   Status Achieved   PT LONG TERM GOAL #3   Title pt able to perform all chores and drive without limitation by neck or upper back pain by 08/15/14   driving is fine, intermittent difficulty with chores due to pain and N/T   Status Partially Met   PT LONG TERM GOAL #4   Title pt reports headache frequency decreases to prior level of function   Status Achieved   PT LONG TERM GOAL #5   Title pt able to perform all work duties without limitation by neck or  upper back pain by 08/15/14  states is able to perform all duties but then states "I might pay for it when I get home"   Status Partially Met               Plan - 07/27/14 1728    Clinical Impression Statement Pt had to leave early due to needing to pick up daughter. Ok tolerance to exercises with increased tightness but no pain. Good tolerance to traction with no headaches reported.    PT Next Visit Plan Continue with thoracic and scapular strengthening, mechanical traction as tolerated, increase at next visit.    Consulted and Agree with Plan of Care Patient        Problem List There are no active problems to display for this patient.   Barbette Hair, PTA 07/27/2014, 5:34 PM  [facility] [address]  [place], [place], [address] Phone: [phone]   Fax:  [phone]

## 2014-08-03 ENCOUNTER — Ambulatory Visit: Payer: 59 | Attending: Sports Medicine | Admitting: Rehabilitation

## 2014-08-03 DIAGNOSIS — M542 Cervicalgia: Secondary | ICD-10-CM | POA: Diagnosis not present

## 2014-08-03 DIAGNOSIS — M6528 Calcific tendinitis, other site: Secondary | ICD-10-CM | POA: Insufficient documentation

## 2014-08-03 DIAGNOSIS — M546 Pain in thoracic spine: Secondary | ICD-10-CM | POA: Diagnosis not present

## 2014-08-03 NOTE — Therapy (Signed)
[facility] [address]  [place], [place], [address] Phone: [phone]   Fax:  [phone]  Physical Therapy Treatment  Patient Details  Name: Joanne Wilson MRN: [id number] Date of Birth: 11/29/1970 Referring Provider:  Berle Mull, MD  Encounter Date: 08/03/2014      PT End of Session - 08/03/14 1150    Visit Number 16   Number of Visits 19   Date for PT Re-Evaluation 08/15/14   PT Start Time 0263   PT Stop Time 1238   PT Time Calculation (min) 50 min      No past medical history on file.  No past surgical history on file.  There were no vitals filed for this visit.  Visit Diagnosis:  Neck pain      Subjective Assessment - 08/03/14 1149    Subjective States she has been to the chiropractor this morning and feels pretty good, some tightness and pain in bilateral shoulder/neck (left more so than right)   Currently in Pain? Yes   Pain Score 3    Pain Orientation Left;Right;Upper      TherEx -  UBE level 2.0 x 90"/90" Corner Pec stretch 3x20" Wall push ups (+) 10x Prone off edge of plinth: T 0# 10x, Bent T 0# 10x, Y 0# 10x TRX rows 10x TRX push ups 10x Prone superman 12x3" POE scapular protraction/retraction 12x3" Childs pose M/R/L 2x20" Seated self median nerve glides with cervical side-bend 10x each side Staggered standing one arm row 15# 10x each side  Mechanical Traction - c-spine, 20dg pull, 10#/5#, 60"/20", 15'          [facility] PT Assessment - 08/03/14 1208    ROM / Strength   AROM / PROM / Strength AROM   AROM   Cervical Extension 55  Pain upper t-spine/lower c-spine   Cervical - Right Side Bend 30  no pain/only slight stretch   Cervical - Left Side Bend 30  no pain/only a slight stretch   Cervical - Right Rotation 70   Cervical - Left Rotation 60  Pain           PT Short Term Goals - 05/25/14 1622    PT SHORT TERM GOAL #1   Title pt independent with advanced HEP  by 05/18/14   Status Achieved           PT Long Term Goals - 07/25/14 1117    PT LONG TERM GOAL #1   Title independent with advanced HEP as necessary by 08/15/14   Status On-going   PT LONG TERM GOAL #2   Title pt displays / verbalizes understanding of improved posture/body mechanics   Status Achieved   PT LONG TERM GOAL #3   Title pt able to perform all chores and drive without limitation by neck or upper back pain by 08/15/14   driving is fine, intermittent difficulty with chores due to pain and N/T   Status Partially Met   PT LONG TERM GOAL #4   Title pt reports headache frequency decreases to prior level of function   Status Achieved   PT LONG TERM GOAL #5   Title pt able to perform all work duties without limitation by neck or upper back pain by 08/15/14  states is able to perform all duties but then states "I might pay for it when I get home"   Status Partially Met  Plan - 08/03/14 1223    Clinical Impression Statement Good tolerance to all exercises, only main noted with prone bent T and protraction/retraction. Increased traction today and will see how pt feels.    PT Next Visit Plan Continue with thoracic and scapular strengthening, mechanical traction as tolerated.   Consulted and Agree with Plan of Care Patient        Problem List There are no active problems to display for this patient.   Barbette Hair, PTA 08/03/2014, 1:06 PM  [facility] [address]  [place], [place], [address] Phone: [phone]   Fax:  [phone]

## 2014-08-08 ENCOUNTER — Ambulatory Visit: Payer: 59 | Admitting: Rehabilitation

## 2014-08-08 DIAGNOSIS — M542 Cervicalgia: Secondary | ICD-10-CM | POA: Diagnosis not present

## 2014-08-08 NOTE — Therapy (Signed)
[facility] [address]  [place], [place], [address] Phone: [phone]   Fax:  [phone]  Physical Therapy Treatment  Patient Details  Name: Joanne Wilson MRN: [id number] Date of Birth: 1970-10-31 Referring Provider:  Berle Mull, MD  Encounter Date: 08/08/2014      PT End of Session - 08/08/14 0855    Visit Number 17   Number of Visits 19   Date for PT Re-Evaluation 08/15/14   PT Start Time 0853   PT Stop Time 0938   PT Time Calculation (min) 45 min      No past medical history on file.  No past surgical history on file.  There were no vitals filed for this visit.  Visit Diagnosis:  Neck pain      Subjective Assessment - 08/08/14 0853    Subjective Reports neck is still bothering her, goes to the chirporactor this afternoon. Felt ok after last time.    Currently in Pain? Yes   Pain Score 3    Pain Location --  Neck/upper back (Lt worse than Rt)   Pain Orientation Left;Right;Upper      TherEx -  UBE level 2.0 x 2'/2' Corner Pec stretch 3x20" Standing with hand on wall medial nerve glides 5x each side (pt had a difficult time due to burning sensation) Seated UT (Rt tighter than Lt), LS (Rt tighter then Lt, minimal stretch felt on Lt) stretch 2x20" each side Rhomboid stretch with hands on stool 2x20" TRX rows 10x TRX push ups 10x Side-Lying Horiz Abd/Open Book 3"x10 each POE scapular protraction/retratction 10x3" (pt reports pain in shoulders, says that her shoulders just felt too weak)  Childs pose M/R/L 2x20"  Mechanical Traction - c-spine, 20dg pull, 10#/5#, 60"/20", 15'       PT Short Term Goals - 05/25/14 1622    PT SHORT TERM GOAL #1   Title pt independent with advanced HEP by 05/18/14   Status Achieved           PT Long Term Goals - 07/25/14 1117    PT LONG TERM GOAL #1   Title independent with advanced HEP as necessary by 08/15/14   Status On-going   PT LONG TERM GOAL  #2   Title pt displays / verbalizes understanding of improved posture/body mechanics   Status Achieved   PT LONG TERM GOAL #3   Title pt able to perform all chores and drive without limitation by neck or upper back pain by 08/15/14   driving is fine, intermittent difficulty with chores due to pain and N/T   Status Partially Met   PT LONG TERM GOAL #4   Title pt reports headache frequency decreases to prior level of function   Status Achieved   PT LONG TERM GOAL #5   Title pt able to perform all work duties without limitation by neck or upper back pain by 08/15/14  states is able to perform all duties but then states "I might pay for it when I get home"   Status Partially Met               Plan - 08/08/14 0911    Clinical Impression Statement Very limited tolerance today due to burning sensation felt with corner stretch and nerve glides. More stretching today due to higher pain levels and limited tolerance to exercise.    PT Next Visit Plan Continue with thoracic and scapular strengthening, mechanical traction as tolerated. Increase HEP  as able.   Consulted and Agree with Plan of Care Patient        Problem List There are no active problems to display for this patient.   Barbette Hair, PTA 08/08/2014, 9:25 AM  [facility] [address]  [place], [place], [address] Phone: [phone]   Fax:  [phone]

## 2014-08-10 ENCOUNTER — Ambulatory Visit: Payer: 59 | Admitting: Physical Therapy

## 2014-08-10 DIAGNOSIS — M542 Cervicalgia: Secondary | ICD-10-CM | POA: Diagnosis not present

## 2014-08-10 NOTE — Therapy (Signed)
[facility] [address]  [place], [place], [address] Phone: [phone]   Fax:  [phone]  Physical Therapy Treatment  Patient Details  Name: Joanne Wilson MRN: [id number] Date of Birth: 04-Jun-1970 Referring Provider:  Berle Mull, MD  Encounter Date: 08/10/2014      PT End of Session - 08/10/14 1340    Visit Number 18   Number of Visits 19   Date for PT Re-Evaluation 08/15/14   PT Start Time 9518   PT Stop Time 1150   PT Time Calculation (min) 45 min      No past medical history on file.  No past surgical history on file.  There were no vitals filed for this visit.  Visit Diagnosis:  Neck pain      Subjective Assessment - 08/10/14 1109    Subjective States had 4/10 earlier this AM stating noted increased pain due to having to lift mattress last night and noted pain.  Went to [employer] earlier today and pain decreased to 3/10 but states she feels as though neck pain is about to cause HA.  States has been performing HEP but denies noting change in UE N/T.   Currently in Pain? Yes   Pain Score 3    Pain Location Neck   Pain Orientation Right;Left;Upper   Multiple Pain Sites No            [facility] PT Assessment - 08/10/14 0001    ROM / Strength   AROM / PROM / Strength AROM   AROM   AROM Assessment Site Cervical   Cervical Extension 47  upper neck pain   Cervical - Right Side Bend 40   Cervical - Left Side Bend 34   Cervical - Right Rotation 72   Cervical - Left Rotation 58  L neck pain       TODAY'S TREATMENT: TherEx - Prone T-spine extension with c-spine retraction 10x3" (to replace POE neck extension from HEP) Reviewed pt's c/o LBP and instructed in hip flexor and RF stretches along with abdominal exercises  Manual - C-spine manual traction and gentle suboccipital release due to c/o HA today.  Attempted manual traction but unable to tolerate today due to hypersensitive tissues in suboccip  area.          PT Short Term Goals - 05/25/14 1622    PT SHORT TERM GOAL #1   Title pt independent with advanced HEP by 05/18/14   Status Achieved           PT Long Term Goals - 08/10/14 1338    PT LONG TERM GOAL #1   Title independent with advanced HEP as necessary by 08/15/14   Status Achieved   PT LONG TERM GOAL #2   Title pt displays / verbalizes understanding of improved posture/body mechanics   Status Achieved   PT LONG TERM GOAL #3   Title pt able to perform all chores and drive without limitation by neck or upper back pain by 08/15/14   continued intermittent difficulty.  Moved queen size mattress yesterday and noted increased neck pain and headache.   Status Partially Met   PT LONG TERM GOAL #4   Title pt reports headache frequency decreases to prior level of function   Status Achieved               Plan - 08/10/14 1333    Clinical Impression Statement Pt has not made much progress with PT  lately with regard to neck symptoms. She states overall she has improved with regard to intensity and frequency of neck pain and symptoms and she states driving is rarely impaired at this time.  Yet, she still experiences N/T into UE and she notes so degree of pain in neck 50% of waking hours per her report.  Due to lack of progress with PT I am only having her attend one more PT treatment to review her HEP and answer any questions then will discharge due to lack of continued progress.  Pt with secondary c/o LBP today.  A brief assessment indicates postural dysfunction as likely cause and I instructed her in quad and hip flexor stretching as well as abdominal training.  She may request PT referral for this in this future and this would be required for any additional treatment for LBP.   PT Next Visit Plan review HEP (to include changing POE exerice to prone version with t-spine extension and c-spine retraction). Manual and/or traction PRN.   Consulted and Agree with Plan of Care  Patient        Problem List There are no active problems to display for this patient.   Kerri Asche PT, OCS 08/10/2014, 3:32 PM  [facility] [address]  [place], [place], [address] Phone: [phone]   Fax:  [phone]

## 2014-08-17 ENCOUNTER — Ambulatory Visit: Payer: 59 | Admitting: Physical Therapy

## 2014-08-22 ENCOUNTER — Ambulatory Visit: Payer: 59 | Admitting: Rehabilitation

## 2014-08-22 DIAGNOSIS — M542 Cervicalgia: Secondary | ICD-10-CM

## 2014-08-22 NOTE — Therapy (Addendum)
[facility] [address]  [place], [place], [address] Phone: [phone]   Fax:  [phone]  Physical Therapy Treatment  Patient Details  Name: Joanne Wilson MRN: [id number] Date of Birth: Nov 02, 1970 Referring Provider:  Berle Mull, MD  Encounter Date: 08/22/2014      PT End of Session - 08/22/14 1659    Visit Number 19   Number of Visits 19   Date for PT Re-Evaluation 08/15/14   PT Start Time 6812   PT Stop Time 1730   PT Time Calculation (min) 35 min      No past medical history on file.  No past surgical history on file.  There were no vitals filed for this visit.  Visit Diagnosis:  Neck pain      Subjective Assessment - 08/22/14 1658    Subjective Notes some mid-back/neck/LBP today following her appointment with her chiropractor. Her Lt hand has been bothering her more at night for unknown reason. Chiropractor thinks that she needs to see a neurologist a get a rating (assuming a disablility rating).    Currently in Pain? Yes   Pain Score 4    Pain Location --  neck/mid back   Pain Orientation Left;Right;Upper   Aggravating Factors  reaching, pulling motions          TODAY'S TREATMENT: TherEx -UBE level 2.0 2'/2' Reviewed final HEP (pt unable to perform due to high pain level in midback)(see pt education) MHP to mid to low back x15'        [facility] PT Assessment - 08/22/14 1706    Observation/Other Assessments   Focus on Therapeutic Outcomes (FOTO)  42% limited (CK)   ROM / Strength   AROM / PROM / Strength AROM   AROM   AROM Assessment Site Cervical   Cervical Extension 52   Cervical - Right Side Bend 38   Cervical - Left Side Bend 35   Cervical - Right Rotation 70   Cervical - Left Rotation 65               PT Short Term Goals - 08/22/14 1710    PT SHORT TERM GOAL #1   Title pt independent with advanced HEP by 05/18/14   Status Achieved           PT Long Term  Goals - 08/22/14 1710    PT LONG TERM GOAL #1   Title independent with advanced HEP as necessary by 08/15/14   Status Achieved   PT LONG TERM GOAL #2   Title pt displays / verbalizes understanding of improved posture/body mechanics   Status Achieved   PT LONG TERM GOAL #3   Title pt able to perform all chores and drive without limitation by neck or upper back pain by 08/15/14   Reports she has good days and bad days so that plays a factor.    Status Partially Met   PT LONG TERM GOAL #4   Title pt reports headache frequency decreases to prior level of function   Status Achieved   PT LONG TERM GOAL #5   Title pt able to perform all work duties without limitation by neck or upper back pain by 08/15/14  Able to do all job activities but has pain later on   Status Partially Met               Plan - 08/22/14 1728    Clinical Impression Statement Pt is  ready to continue exercises at home and wait for consult with neurologist. Unable to do exercises today due to higher pain levels but reviewed final HEP which pt felt comfortable with. Pt will continue to see chiropractor 1-2x week until she sees neurologist which pt is waiting for a phone call to schedule.    PT Next Visit Plan d/c   Consulted and Agree with Plan of Care Patient        Problem List There are no active problems to display for this patient.   Barbette Hair, PTA 08/22/2014, 5:44 PM  [facility] [address]  [place], [place], [address] Phone: [phone]   Fax:  [phone]    PHYSICAL THERAPY DISCHARGE SUMMARY  Visits from Start of Care: 19  Current functional level related to goals / functional outcomes: 42% limitation per FOTO   Remaining deficits: Intermittent neck pain   Education / Equipment: HEP  Plan: Patient agrees to discharge.  Patient goals were partially met. Patient is being discharged due to lack of progress.  ?????        Joanne Wilson made some improvements with PT initially but progress has plateaued and she is being discharged from our care at this time.  She has been instructed in HEP which will hopefully offer continued benefit.  Leonette Most PT, OCS 09/08/2014  1:29 PM

## 2014-08-24 ENCOUNTER — Ambulatory Visit: Payer: 59 | Admitting: Rehabilitation

## 2015-04-27 DIAGNOSIS — M5412 Radiculopathy, cervical region: Secondary | ICD-10-CM | POA: Diagnosis not present

## 2015-04-27 DIAGNOSIS — M791 Myalgia: Secondary | ICD-10-CM | POA: Diagnosis not present

## 2015-04-27 DIAGNOSIS — M542 Cervicalgia: Secondary | ICD-10-CM | POA: Diagnosis not present

## 2015-05-22 ENCOUNTER — Ambulatory Visit: Payer: 59 | Attending: Physical Medicine and Rehabilitation | Admitting: Physical Therapy

## 2015-05-22 DIAGNOSIS — M542 Cervicalgia: Secondary | ICD-10-CM | POA: Diagnosis not present

## 2015-05-22 DIAGNOSIS — M546 Pain in thoracic spine: Secondary | ICD-10-CM | POA: Diagnosis not present

## 2015-05-22 DIAGNOSIS — M549 Dorsalgia, unspecified: Secondary | ICD-10-CM
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
## 2015-05-30 ENCOUNTER — Ambulatory Visit: Payer: 59 | Admitting: Physical Therapy

## 2015-05-31 ENCOUNTER — Ambulatory Visit
Payer: 59 | Attending: Physical Medicine and Rehabilitation | Admitting: Rehabilitative and Restorative Service Providers"

## 2015-05-31 DIAGNOSIS — M546 Pain in thoracic spine: Secondary | ICD-10-CM | POA: Insufficient documentation

## 2015-05-31 DIAGNOSIS — M542 Cervicalgia: Secondary | ICD-10-CM | POA: Insufficient documentation

## 2015-06-06 ENCOUNTER — Ambulatory Visit: Payer: 59 | Admitting: Physical Therapy

## 2015-06-09 ENCOUNTER — Ambulatory Visit: Payer: 59 | Admitting: Physical Therapy

## 2015-06-09 DIAGNOSIS — M542 Cervicalgia: Secondary | ICD-10-CM | POA: Diagnosis not present

## 2015-06-09 DIAGNOSIS — M546 Pain in thoracic spine: Secondary | ICD-10-CM | POA: Diagnosis not present

## 2015-06-09 DIAGNOSIS — M549 Dorsalgia, unspecified: Secondary | ICD-10-CM

## 2015-06-09 NOTE — Therapy (Signed)
[facility] [address]  [place], [place], [address] Phone: [phone]   Fax:  [phone]  Physical Therapy Treatment  Patient Details  Name: Joanne Wilson MRN: [id number] Date of Birth: Jul 26, 1970 Referring Provider: Laroy Apple, MD  Encounter Date: 06/09/2015      PT End of Session - 06/09/15 0901    Visit Number 2   Number of Visits 12   Date for PT Re-Evaluation 07/03/15   PT Start Time 0852   PT Stop Time 0922   PT Time Calculation (min) 30 min      No past medical history on file.  No past surgical history on file.  There were no vitals filed for this visit.  Visit Diagnosis:  Neck pain  Upper back pain on left side      Subjective Assessment - 06/09/15 0858    Subjective States "my new development is pain down L arm causing cramping in fingers".  Notes sharp pain in L scapula with L UE elevation and neck AROM.  States she was advised she should have dry needling to rhomboids.   Currently in Pain? Yes   Pain Score 4   with activity   Pain Location Scapula   Pain Orientation Left;Upper;Medial         TODAY'S TREATMENT Manual - Dry Needling (informed verbal consent provided prior to treatment) performed to R medial rhomboids with pt in L side-lying and scapula retracted passively.  Then pt positioned supine for DN into UT.  No twitch response or immediate benefit noted.  Following DN, performed STM throughout same areas with pt prone.             PT Short Term Goals - 06/09/15 0927    PT SHORT TERM GOAL #1   Title Pt independent with initial HEP by 06/12/15   Status On-going           PT Long Term Goals - 06/09/15 0902    PT LONG TERM GOAL #1   Title Pt independent with advanced HEP as appropriate by 07/03/15   Status On-going   PT LONG TERM GOAL #2   Title Pt displays / verbalizes understanding of improved posture/body mechanics by 07/03/15   Status On-going   PT LONG  TERM GOAL #3   Title Pt able to perform all household chores without limitation by neck or upper back pain >3/10 by 07/03/15   Status On-going   PT LONG TERM GOAL #4   Title Pt able to perform all work duties without limitation by neck or upper back pain >3/10 by 07/03/15   Status On-going               Plan - 06/09/15 0923    Clinical Impression Statement initial dry needling session well tolerated today but no twitch response noted with needling today.  Most tenderness noted in upper aspect of R rhomboids and in LS and UT.  These were ares of DN focus today.  Will assess benefit with f/u appointments and treat as indicated.   PT Next Visit Plan Complete soft tissue assessment; Create initial HEP   Consulted and Agree with Plan of Care Patient        Problem List There are no active problems to display for this patient.   Danniell Rotundo PT, OCS 06/09/2015, 9:27 AM  [facility] [address]  [place], [place], [address] Phone: [phone]  Fax:  [phone]  Name: ADYSIN SPITALE MRN: [id number] Date of Birth: [birth year]

## 2015-06-12 ENCOUNTER — Ambulatory Visit: Payer: 59 | Admitting: Physical Therapy

## 2015-06-12 DIAGNOSIS — M542 Cervicalgia: Secondary | ICD-10-CM

## 2015-06-12 DIAGNOSIS — M549 Dorsalgia, unspecified: Secondary | ICD-10-CM

## 2015-06-12 DIAGNOSIS — M546 Pain in thoracic spine: Secondary | ICD-10-CM | POA: Diagnosis not present

## 2015-06-12 NOTE — Therapy (Signed)
[facility] [address]  [place], [place], [address] Phone: [phone]   Fax:  [phone]  Physical Therapy Treatment  Patient Details  Name: ADOREE LUBER MRN: [id number] Date of Birth: 1970-08-09 Referring Provider: Laroy Apple, MD  Encounter Date: 06/12/2015      PT End of Session - 06/12/15 1755    Visit Number 3   Number of Visits 12   Date for PT Re-Evaluation 07/03/15   PT Start Time 1709  Pt arrived late   PT Stop Time 1741  Pt needing to leave early to pick up dtr   PT Time Calculation (min) 32 min   Activity Tolerance Patient tolerated treatment well   Behavior During Therapy [facility] for tasks assessed/performed      No past medical history on file.  No past surgical history on file.  There were no vitals filed for this visit.  Visit Diagnosis:  Neck pain  Upper back pain on left side      Subjective Assessment - 06/12/15 1717    Subjective Pt unsure of benefit from dry needling at last visit. States pain has fluctuated since last time she was here, with pain at its worst when attempting to style the back of her hair. Feels neurontin is helping with her pain, especially  stating her radicular pain/cramping down her L arm resolved.    Pain Score 4    Pain Location Scapula   Pain Orientation Left;Upper;Medial         TODAY'S TREATMENT  Manual STM with manual stretching to L rhomboids, levator, upper trap and SCM Attempted SOC but pt reporting it triggered a headache, therefore discontinued PROM to cervical spine with full ROM available, slight brief pain with PROM into L rotation but pain unable to localize pain other than vague reference to UT  TherEx Reviewed UT and LS stretches from prior HEP with corrections for proper performance          PT Short Term Goals - 06/09/15 0927    PT SHORT TERM GOAL #1   Title Pt independent with initial HEP by 06/12/15   Status On-going           PT Long Term Goals - 06/09/15 0902    PT LONG TERM GOAL #1   Title Pt independent with advanced HEP as appropriate by 07/03/15   Status On-going   PT LONG TERM GOAL #2   Title Pt displays / verbalizes understanding of improved posture/body mechanics by 07/03/15   Status On-going   PT LONG TERM GOAL #3   Title Pt able to perform all household chores without limitation by neck or upper back pain >3/10 by 07/03/15   Status On-going   PT LONG TERM GOAL #4   Title Pt able to perform all work duties without limitation by neck or upper back pain >3/10 by 07/03/15   Status On-going               Plan - 06/12/15 1804    Clinical Impression Statement Pt reporting pain most prominent along medial border of L scapula and along base of skull. Poor tolerance for SOC due to triggers headache. Slight increased tension noted in L UT, LS, and SCM today with limited reponse to STM and stretches. Treatment limited due to late arrival and need to leave early.   PT Next Visit Plan Create initial HEP, Postural education with strengthening, Manual therapy for soft tissue restrictions  Consulted and Agree with Plan of Care Patient        Problem List There are no active problems to display for this patient.   Percival Spanish, PT, MPT 06/12/2015, 6:17 PM  [facility] [address]  [place], [place], [address] Phone: [phone]   Fax:  [phone]  Name: EVALISE DELLY MRN: [id number] Date of Birth: 07-07-1970

## 2015-06-14 ENCOUNTER — Ambulatory Visit: Payer: 59 | Admitting: Physical Therapy

## 2015-06-14 DIAGNOSIS — M549 Dorsalgia, unspecified: Secondary | ICD-10-CM

## 2015-06-14 DIAGNOSIS — M546 Pain in thoracic spine: Secondary | ICD-10-CM | POA: Diagnosis not present

## 2015-06-14 DIAGNOSIS — M542 Cervicalgia: Secondary | ICD-10-CM

## 2015-06-14 NOTE — Therapy (Signed)
[facility] [address]  [place], [place], [address] Phone: [phone]   Fax:  [phone]  Physical Therapy Treatment  Patient Details  Name: Joanne Wilson MRN: [id number] Date of Birth: November 25, 1970 Referring Provider: Laroy Apple, MD  Encounter Date: 06/14/2015      PT End of Session - 06/14/15 1113    Visit Number 4   Number of Visits 12   Date for PT Re-Evaluation 07/03/15   PT Start Time 1112   PT Stop Time 1148   PT Time Calculation (min) 36 min      No past medical history on file.  No past surgical history on file.  There were no vitals filed for this visit.  Visit Diagnosis:  Neck pain  Upper back pain on left side      Subjective Assessment - 06/14/15 1114    Subjective States her pain is still present but is more dull in nature rather than sharp.  States needling created more soreness in L UT than it had in past treatments.   Currently in Pain? Yes   Pain Score 3    Pain Location Scapula   Pain Orientation Left;Upper;Medial           TODAY'S TREATMENT Manual - Dry Needling (informed verbal consent provided prior to treatment) performed to R medial rhomboid minor with pt in L side-lying and scapula retracted passively. Then performed DFM followed STM to same area.          PT Short Term Goals - 06/09/15 0927    PT SHORT TERM GOAL #1   Title Pt independent with initial HEP by 06/12/15   Status On-going           PT Long Term Goals - 06/09/15 0902    PT LONG TERM GOAL #1   Title Pt independent with advanced HEP as appropriate by 07/03/15   Status On-going   PT LONG TERM GOAL #2   Title Pt displays / verbalizes understanding of improved posture/body mechanics by 07/03/15   Status On-going   PT LONG TERM GOAL #3   Title Pt able to perform all household chores without limitation by neck or upper back pain >3/10 by 07/03/15   Status On-going   PT LONG TERM GOAL #4   Title Pt able to perform all work duties without limitation by neck or upper back pain >3/10 by 07/03/15   Status On-going               Plan - 06/14/15 1151    Clinical Impression Statement Needling today focused more on medial border of scapula and not performed to UT at all.  Very tender on bony edge of scapula at superior medial border and seems as though is likely rhomboid minor - possible strain and scarring in area.  Addressed with needling and DFM today.   PT Next Visit Plan progress therex as able, continue needling and DFM to rhomboid minor, other manual PRN   Consulted and Agree with Plan of Care Patient        Problem List There are no active problems to display for this patient.   Turrell Severt PT, OCS 06/14/2015, 11:57 AM  [facility] [address]  [place], [place], [address] Phone: [phone]   Fax:  [phone]  Name: Joanne Wilson MRN: [id number] Date of Birth: July 04, 1970

## 2015-06-19 ENCOUNTER — Ambulatory Visit: Payer: 59 | Admitting: Physical Therapy

## 2015-06-21 ENCOUNTER — Ambulatory Visit: Payer: 59 | Admitting: Physical Therapy

## 2015-06-21 DIAGNOSIS — M542 Cervicalgia: Secondary | ICD-10-CM | POA: Diagnosis not present

## 2015-06-21 DIAGNOSIS — M549 Dorsalgia, unspecified: Secondary | ICD-10-CM

## 2015-06-21 DIAGNOSIS — M546 Pain in thoracic spine: Secondary | ICD-10-CM | POA: Diagnosis not present

## 2015-06-21 NOTE — Therapy (Signed)
[facility] [address]  [place], [place], [address] Phone: [phone]   Fax:  [phone]  Physical Therapy Treatment  Patient Details  Name: Joanne Wilson MRN: [id number] Date of Birth: [birth year] Referring Provider: Laroy Apple, MD  Encounter Date: 06/21/2015      PT End of Session - 06/21/15 1113    Visit Number 5   Number of Visits 12   Date for PT Re-Evaluation 07/03/15   PT Start Time [id number]   PT Stop Time 1150   PT Time Calculation (min) 39 min      No past medical history on file.  No past surgical history on file.  There were no vitals filed for this visit.  Visit Diagnosis:  Neck pain  Upper back pain on left side      Subjective Assessment - 06/21/15 1113    Subjective States shoulder/scapula pain is not present today but still with upper c-spine pain and slight headache today.  States was sore after last treatment but then pain went away.  She states she has been able to perform ADLs without issue by L shoulder/scapular pain lately stating, "I forgot about it".   Currently in Pain? Yes   Pain Score 3    Pain Location Neck   Pain Orientation Upper;Mid;Posterior            TODAY'S TREATMENT Manual - STM and suboccip release with emphasis on R upper c-spine.  Not well tolerated and pt c/o increasing HA which she states was mostly due to lying supine. Therefore, changed to prone lying and performed Dry Needling (verbal informed consent given prior to treatment) performed to R upper c-spine and suboccipital mms (mainly into upper aspect of semispinalis and into rectus capitis minor).  This was well tolerated. No twitch response noted but good tenderness noted with 2 of the needles.               PT Short Term Goals - 06/09/15 0927    PT SHORT TERM GOAL #1   Title Pt independent with initial HEP by 06/12/15   Status On-going           PT Long Term Goals - 06/09/15  0902    PT LONG TERM GOAL #1   Title Pt independent with advanced HEP as appropriate by 07/03/15   Status On-going   PT LONG TERM GOAL #2   Title Pt displays / verbalizes understanding of improved posture/body mechanics by 07/03/15   Status On-going   PT LONG TERM GOAL #3   Title Pt able to perform all household chores without limitation by neck or upper back pain >3/10 by 07/03/15   Status On-going   PT LONG TERM GOAL #4   Title Pt able to perform all work duties without limitation by neck or upper back pain >3/10 by 07/03/15   Status On-going               Plan - 06/21/15 1501    Clinical Impression Statement Pt states she was sore throughout scapular mms after last PT session but states once the soreness went away so did her scapular pain. She states she hasn't noted scapular pain lately and "forgot about it". However, she continues to c/o upper neck pain and had HA today.  Performed DN to upper c-spine and suboccip area today due to no tolerating initial attempt at [facility] to same area.  Treatment was well tolerated and good  areas of sensitivity were localized and treated. We'll assess benefit of DN to this area next week.   PT Next Visit Plan progress therex as able, continue needling and DFM to scapular and c-spine mms PRN; other manual PRN   Consulted and Agree with Plan of Care Patient        Problem List There are no active problems to display for this patient.   [facility] PT, OCS 06/21/2015, 3:06 PM  [facility] [address]  [place], [place], [address] Phone: [phone]   Fax:  [phone]  Name: Joanne Wilson MRN: [id number] Date of Birth: [birth year]

## 2015-06-26 ENCOUNTER — Ambulatory Visit: Payer: 59 | Admitting: Physical Therapy

## 2015-06-26 DIAGNOSIS — M549 Dorsalgia, unspecified: Secondary | ICD-10-CM

## 2015-06-26 DIAGNOSIS — M542 Cervicalgia: Secondary | ICD-10-CM | POA: Diagnosis not present

## 2015-06-26 DIAGNOSIS — M546 Pain in thoracic spine: Secondary | ICD-10-CM | POA: Diagnosis not present

## 2015-06-26 NOTE — Therapy (Signed)
[facility] [address]  [place], [place], [address] Phone: [phone]   Fax:  [phone]  Physical Therapy Treatment  Patient Details  Name: Joanne Wilson MRN: [id number] Date of Birth: 03/14/1971 Referring Provider: Laroy Apple, MD  Encounter Date: 06/26/2015      PT End of Session - 06/26/15 1739    Visit Number 6   Number of Visits 12   Date for PT Re-Evaluation 07/03/15   PT Start Time 1703   PT Stop Time [id number]  Pt needing to leave early to pick up child   PT Time Calculation (min) 35 min   Activity Tolerance Patient tolerated treatment well   Behavior During Therapy [facility] for tasks assessed/performed      No past medical history on file.  No past surgical history on file.  There were no vitals filed for this visit.  Visit Diagnosis:  Neck pain  Upper back pain on left side      Subjective Assessment - 06/26/15 1712    Subjective Pt states pain is now back in the neck, upper back and L scapular area today. Reports migraine yesterday. Noting increasing numbness and tingling in UE's down to hands but unable to identify exact location of N/T. Reports not consistently taking Neurontin due to "it makes her sleepy".   Currently in Pain? Yes   Pain Score 3    Pain Location Neck   Pain Orientation Upper;Mid;Lower;Posterior  upper back & L scapula   Pain Descriptors / Indicators Tightness         TODAY'S TREATMENT  TherEx UBE - lvl 1.5 fwd/back x 90" each B UT & LS stretches 2x30" each Hooklying on 1/2 foam roll:   Chest/pec stretch x 1'   B Shoulder Horiz ABD with yellow TB 10x3"   B [place] ER with yellow TB 10x3"   B Shoulder Flexion pullover with 3# 10x3"  Manual STM with manual stretching to L rhomboids, levator, upper trap and SCM (head maintained flat on mat between stretches and during STM whenever possible due pt reporting discomfort when head held by PT) STM and suboccip  release with limited tolerance to therapist hands/fingers but reported good stretch with improved comfort when rolled hand towel used for upper cervical extension with slight distraction PROM to cervical spine with full ROM available          PT Short Term Goals - 06/09/15 0927    PT SHORT TERM GOAL #1   Title Pt independent with initial HEP by 06/12/15   Status On-going           PT Long Term Goals - 06/09/15 0902    PT LONG TERM GOAL #1   Title Pt independent with advanced HEP as appropriate by 07/03/15   Status On-going   PT LONG TERM GOAL #2   Title Pt displays / verbalizes understanding of improved posture/body mechanics by 07/03/15   Status On-going   PT LONG TERM GOAL #3   Title Pt able to perform all household chores without limitation by neck or upper back pain >3/10 by 07/03/15   Status On-going   PT LONG TERM GOAL #4   Title Pt able to perform all work duties without limitation by neck or upper back pain >3/10 by 07/03/15   Status On-going               Plan - 06/26/15 1748    Clinical Impression Statement Pt reporting  return of pain "everywhere" (upper/mid/lower C-spine + upper/mid thoracic & L scapula) which she attirbutes to forward flexed head posture while working on her work [employer]. Provided education in positioning of laptop to reduce cervical strain and encouraged her to look down with her eyes rather than flexing her neck. Pt demonstrating limited tolerance to positioning on 1/2 foam roll as well as ability to relax cervical mm during manual therapy but somewhat improved tolerance when rolled towel used for stretching and distraction.   PT Next Visit Plan progress therex as able, continue needling and DFM to scapular and c-spine mms PRN; other manual PRN   Consulted and Agree with Plan of Care Patient        Problem List There are no active problems to display for this patient.   Percival Spanish, PT, MPT 06/26/2015, 5:57 PM  [facility] [address]  [place], [place], [address] Phone: [phone]   Fax:  [phone]  Name: Joanne Wilson MRN: [id number] Date of Birth: 08/12/1970

## 2015-06-28 ENCOUNTER — Ambulatory Visit: Payer: 59 | Admitting: Physical Therapy

## 2015-07-03 ENCOUNTER — Ambulatory Visit: Payer: 59 | Admitting: Physical Therapy

## 2015-07-05 ENCOUNTER — Ambulatory Visit: Payer: 59 | Attending: Physical Medicine and Rehabilitation | Admitting: Physical Therapy

## 2015-07-05 DIAGNOSIS — M542 Cervicalgia: Secondary | ICD-10-CM | POA: Insufficient documentation

## 2015-07-05 DIAGNOSIS — M546 Pain in thoracic spine: Secondary | ICD-10-CM | POA: Diagnosis not present

## 2015-07-05 NOTE — Therapy (Addendum)
Joanne Wilson [address]  [place], [place], [address] Phone: [phone]   Fax:  [phone]  Physical Therapy Treatment  Patient Details  Name: Joanne Wilson MRN: [id number] Date of Birth: 03-23-1971 Referring Provider: Laroy Apple, MD  Encounter Date: 07/05/2015      PT End of Session - 07/05/15 1113    Visit Number 7   Number of Visits 12   PT Start Time 1106   PT Stop Time 1201   PT Time Calculation (min) 55 min      No past medical history on file.  No past surgical history on file.  There were no vitals filed for this visit.  Visit Diagnosis:  Neck pain  Upper back pain on left side      Subjective Assessment - 07/05/15 1109    Subjective pt c/o great deal of numbness in L hand at night (states usually approx 3 fingers at a time but believes has been in all 5 fingers at different times) and intermittent tingling in L hand throughout the day.  She states shooting pain in L scapula has returned stating has noted this on 3 occasions in the past week. She states she performs neck stretching HEP which limits progression of symptoms but pain still 3/10 on AVG and up to 6/10 lately.  Also c/o nearly constant headache.  She requests dry needling today to L scapular mms and neck mms.   Currently in Pain? Yes   Pain Score 4    Pain Location Neck   Pain Orientation Left;Lower              TODAY'S TREATMENT  Manual - Dry Needling (informed verbal consent provided prior to treatment) performed to R medial rhomboid minor, R UT (good twitch response) and then to upper R c-spine paraspinals. Following needling performed grade 2 and 3 upper t-spine extension mobs with pt prone. PT supine, manual TPR to R UT and LS area followed by c-spine B side-bending and L rotation mobs up to grade 3 (to tolerance).                     PT Short Term Goals - 06/09/15 0927    PT SHORT TERM GOAL #1   Title Pt independent with initial HEP by 06/12/15   Status On-going           PT Long Term Goals - 06/09/15 0902    PT LONG TERM GOAL #1   Title Pt independent with advanced HEP as appropriate by 07/03/15   Status On-going   PT LONG TERM GOAL #2   Title Pt displays / verbalizes understanding of improved posture/body mechanics by 07/03/15   Status On-going   PT LONG TERM GOAL #3   Title Pt able to perform all household chores without limitation by neck or upper back pain >3/10 by 07/03/15   Status On-going   PT LONG TERM GOAL #4   Title Pt able to perform all work duties without limitation by neck or upper back pain >3/10 by 07/03/15   Status On-going               Plan - 07/06/15 1356    Clinical Impression Statement [name] displays great c-spine AROM improvment with R rotation but all other planes basically unchanged since initial eval.  She has missed a great deal of her appointments and has only attended 7 to date; however, her 6  week POC was up this week.  She has 5 visits remaining but none of these have been scheduled as of right now.  She is going to contact her MD regarding her increased radicular symptoms (not sure if c-spine or CTS, unable to reproduce with neck mobs or ROM). Overall progress has been minimal with PT to date.  She reports benefit with HEP and needling but still with significant pain and limited function.   PT Next Visit Plan progress therex as able, continue needling and DFM to scapular and c-spine mms PRN; other manual PRN   Consulted and Agree with Plan of Care Patient        Problem List There are no active problems to display for this patient.   Nikcole Eischeid PT, OCS 07/06/2015, 2:01 PM  [facility] [address]  [place], [place], [address] Phone: [phone]   Fax:  [phone]  Name: Joanne Wilson MRN: [id number] Date of Birth: 21-Jun-1970    PHYSICAL THERAPY DISCHARGE  SUMMARY  Visits from Start of Care: 7  Current functional level related to goals / functional outcomes:   As of her last visit, [name] displayed great c-spine AROM improvement with R rotation but all other planes basically unchanged since initial eval. She had missed a great deal of her appointments and has only attended 7 as of this visit; however, her 6 week POC was up at that time.Overall progress had been minimal with PT to date. She reported benefit with HEP and needling but still with significant pain and limited function.She was going to contact her MD regarding her increased radicular symptoms (not sure if c-spine or CTS, unable to reproduce with neck mobs or ROM). No further contact received from pt in >30 days, therefore will proceed with discharge from PT for this episode.   Remaining deficits:   Continued pain and radicular symptoms   Education / Equipment:   HEP  Plan: Patient agrees to discharge.  Patient goals were not met. Patient is being discharged due to not returning since the last visit.  ?????       Percival Spanish, PT, MPT 09/04/2015, 8:27 AM  [facility] [address]  [place], [place], [address] Phone: [phone]   Fax:  [phone]

## 2015-08-18 ENCOUNTER — Other Ambulatory Visit: Payer: Self-pay | Admitting: Obstetrics and Gynecology

## 2015-08-18 ENCOUNTER — Other Ambulatory Visit (HOSPITAL_COMMUNITY)
Admission: RE | Admit: 2015-08-18 | Discharge: 2015-08-18 | Disposition: A | Discharge status: Home / Self Care | Payer: 59 | Source: Ambulatory Visit | Attending: Obstetrics and Gynecology | Admitting: Obstetrics and Gynecology

## 2015-08-18 DIAGNOSIS — Z01411 Encounter for gynecological examination (general) (routine) with abnormal findings: Secondary | ICD-10-CM | POA: Diagnosis not present

## 2015-08-18 DIAGNOSIS — Z01419 Encounter for gynecological examination (general) (routine) without abnormal findings: Secondary | ICD-10-CM | POA: Diagnosis not present

## 2015-08-18 DIAGNOSIS — F411 Generalized anxiety disorder: Secondary | ICD-10-CM | POA: Diagnosis not present

## 2015-08-18 DIAGNOSIS — N946 Dysmenorrhea, unspecified: Secondary | ICD-10-CM | POA: Diagnosis not present

## 2015-08-18 DIAGNOSIS — Z1151 Encounter for screening for human papillomavirus (HPV): Secondary | ICD-10-CM | POA: Diagnosis not present

## 2015-08-18 DIAGNOSIS — N920 Excessive and frequent menstruation with regular cycle: Secondary | ICD-10-CM | POA: Diagnosis not present

## 2015-08-23 DIAGNOSIS — I8312 Varicose veins of left lower extremity with inflammation: Secondary | ICD-10-CM | POA: Diagnosis not present

## 2015-08-23 DIAGNOSIS — R600 Localized edema: Secondary | ICD-10-CM | POA: Diagnosis not present

## 2015-08-23 DIAGNOSIS — I8311 Varicose veins of right lower extremity with inflammation: Secondary | ICD-10-CM | POA: Diagnosis not present

## 2015-08-23 LAB — CYTOLOGY - PAP

## 2015-08-24 DIAGNOSIS — D259 Leiomyoma of uterus, unspecified: Secondary | ICD-10-CM | POA: Diagnosis not present

## 2015-08-24 DIAGNOSIS — N83201 Unspecified ovarian cyst, right side: Secondary | ICD-10-CM | POA: Diagnosis not present

## 2015-09-01 DIAGNOSIS — H524 Presbyopia: Secondary | ICD-10-CM | POA: Diagnosis not present

## 2015-09-01 DIAGNOSIS — H5213 Myopia, bilateral: Secondary | ICD-10-CM | POA: Diagnosis not present

## 2015-09-01 DIAGNOSIS — H52223 Regular astigmatism, bilateral: Secondary | ICD-10-CM | POA: Diagnosis not present

## 2015-09-14 DIAGNOSIS — M79604 Pain in right leg: Secondary | ICD-10-CM | POA: Diagnosis not present

## 2015-09-14 DIAGNOSIS — I8311 Varicose veins of right lower extremity with inflammation: Secondary | ICD-10-CM | POA: Diagnosis not present

## 2015-10-13 DIAGNOSIS — I8312 Varicose veins of left lower extremity with inflammation: Secondary | ICD-10-CM | POA: Diagnosis not present

## 2015-10-26 DIAGNOSIS — M791 Myalgia: Secondary | ICD-10-CM | POA: Diagnosis not present

## 2015-10-26 DIAGNOSIS — M5412 Radiculopathy, cervical region: Secondary | ICD-10-CM | POA: Diagnosis not present

## 2015-10-26 DIAGNOSIS — M542 Cervicalgia: Secondary | ICD-10-CM | POA: Diagnosis not present

## 2015-11-09 DIAGNOSIS — I83811 Varicose veins of right lower extremities with pain: Secondary | ICD-10-CM | POA: Diagnosis not present

## 2015-11-09 DIAGNOSIS — I87321 Chronic venous hypertension (idiopathic) with inflammation of right lower extremity: Secondary | ICD-10-CM | POA: Diagnosis not present

## 2015-11-09 DIAGNOSIS — I8311 Varicose veins of right lower extremity with inflammation: Secondary | ICD-10-CM | POA: Diagnosis not present

## 2015-11-16 DIAGNOSIS — Z8481 Family history of carrier of genetic disease: Secondary | ICD-10-CM | POA: Diagnosis not present

## 2015-11-16 DIAGNOSIS — N946 Dysmenorrhea, unspecified: Secondary | ICD-10-CM | POA: Diagnosis not present

## 2015-11-16 DIAGNOSIS — N920 Excessive and frequent menstruation with regular cycle: Secondary | ICD-10-CM | POA: Diagnosis not present

## 2015-11-23 DIAGNOSIS — I8311 Varicose veins of right lower extremity with inflammation: Secondary | ICD-10-CM | POA: Diagnosis not present

## 2015-11-23 DIAGNOSIS — I87321 Chronic venous hypertension (idiopathic) with inflammation of right lower extremity: Secondary | ICD-10-CM | POA: Diagnosis not present

## 2015-11-23 DIAGNOSIS — I83811 Varicose veins of right lower extremities with pain: Secondary | ICD-10-CM | POA: Diagnosis not present

## 2015-11-29 ENCOUNTER — Encounter: Payer: Self-pay | Admitting: Genetic Counselor

## 2015-11-29 ENCOUNTER — Telehealth: Payer: Self-pay | Admitting: Genetic Counselor

## 2015-11-29 NOTE — Telephone Encounter (Signed)
Pt returned call and comfirmed appt, completed intake, aware of questions on mychart, mailed new pt letter, faxed referring provider date/time of appt.

## 2015-12-18 ENCOUNTER — Telehealth: Payer: Self-pay | Admitting: Genetic Counselor

## 2015-12-18 NOTE — Telephone Encounter (Signed)
Pt called in an wanted to inform office she will be running a little late from an earlier appointment on the same day.

## 2016-01-02 DIAGNOSIS — M542 Cervicalgia: Secondary | ICD-10-CM | POA: Diagnosis not present

## 2016-01-02 DIAGNOSIS — G5602 Carpal tunnel syndrome, left upper limb: Secondary | ICD-10-CM | POA: Diagnosis not present

## 2016-01-02 DIAGNOSIS — M5412 Radiculopathy, cervical region: Secondary | ICD-10-CM | POA: Diagnosis not present

## 2016-01-02 DIAGNOSIS — M791 Myalgia: Secondary | ICD-10-CM | POA: Diagnosis not present

## 2016-01-03 ENCOUNTER — Encounter: Payer: 59 | Admitting: Genetic Counselor

## 2016-01-03 ENCOUNTER — Other Ambulatory Visit: Payer: 59

## 2016-01-30 DIAGNOSIS — I8311 Varicose veins of right lower extremity with inflammation: Secondary | ICD-10-CM | POA: Diagnosis not present

## 2016-01-31 ENCOUNTER — Encounter: Payer: Self-pay | Admitting: Genetic Counselor

## 2016-01-31 ENCOUNTER — Ambulatory Visit (HOSPITAL_BASED_OUTPATIENT_CLINIC_OR_DEPARTMENT_OTHER): Payer: 59 | Admitting: Genetic Counselor

## 2016-01-31 ENCOUNTER — Other Ambulatory Visit: Payer: 59

## 2016-01-31 DIAGNOSIS — Z315 Encounter for genetic counseling: Secondary | ICD-10-CM

## 2016-01-31 DIAGNOSIS — Z803 Family history of malignant neoplasm of breast: Secondary | ICD-10-CM | POA: Insufficient documentation
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
## 2016-05-13 ENCOUNTER — Encounter (HOSPITAL_COMMUNITY): Payer: Self-pay

## 2016-05-13 DIAGNOSIS — G5602 Carpal tunnel syndrome, left upper limb: Secondary | ICD-10-CM | POA: Diagnosis not present

## 2016-08-14 DIAGNOSIS — M47812 Spondylosis without myelopathy or radiculopathy, cervical region: Secondary | ICD-10-CM | POA: Diagnosis not present

## 2016-08-14 DIAGNOSIS — M542 Cervicalgia: Secondary | ICD-10-CM | POA: Diagnosis not present

## 2016-08-14 DIAGNOSIS — M791 Myalgia: Secondary | ICD-10-CM | POA: Diagnosis not present

## 2016-08-14 DIAGNOSIS — G5602 Carpal tunnel syndrome, left upper limb: Secondary | ICD-10-CM | POA: Diagnosis not present

## 2016-08-20 DIAGNOSIS — M542 Cervicalgia: Secondary | ICD-10-CM | POA: Diagnosis not present

## 2016-09-02 ENCOUNTER — Other Ambulatory Visit: Payer: Self-pay | Admitting: Obstetrics and Gynecology

## 2016-09-02 ENCOUNTER — Other Ambulatory Visit (HOSPITAL_COMMUNITY)
Admission: RE | Admit: 2016-09-02 | Discharge: 2016-09-02 | Disposition: A | Discharge status: Home / Self Care | Payer: 59 | Source: Ambulatory Visit | Attending: Obstetrics and Gynecology | Admitting: Obstetrics and Gynecology

## 2016-09-02 DIAGNOSIS — Z124 Encounter for screening for malignant neoplasm of cervix: Secondary | ICD-10-CM | POA: Insufficient documentation

## 2016-09-02 DIAGNOSIS — N920 Excessive and frequent menstruation with regular cycle: Secondary | ICD-10-CM | POA: Diagnosis not present

## 2016-09-02 DIAGNOSIS — Z01419 Encounter for gynecological examination (general) (routine) without abnormal findings: Secondary | ICD-10-CM | POA: Diagnosis not present

## 2016-09-02 DIAGNOSIS — R8789 Other abnormal findings in specimens from female genital organs: Secondary | ICD-10-CM | POA: Diagnosis not present

## 2016-09-02 DIAGNOSIS — Z01411 Encounter for gynecological examination (general) (routine) with abnormal findings: Secondary | ICD-10-CM | POA: Diagnosis not present

## 2016-09-02 DIAGNOSIS — F411 Generalized anxiety disorder: Secondary | ICD-10-CM | POA: Diagnosis not present

## 2016-09-02 DIAGNOSIS — N761 Subacute and chronic vaginitis: Secondary | ICD-10-CM | POA: Diagnosis not present

## 2016-09-02 DIAGNOSIS — N946 Dysmenorrhea, unspecified: Secondary | ICD-10-CM | POA: Diagnosis not present

## 2016-09-03 ENCOUNTER — Other Ambulatory Visit: Payer: Self-pay | Admitting: Obstetrics and Gynecology

## 2016-09-03 DIAGNOSIS — Z1231 Encounter for screening mammogram for malignant neoplasm of breast: Secondary | ICD-10-CM

## 2016-09-05 LAB — CYTOLOGY - PAP
CHLAMYDIA, DNA PROBE: NEGATIVE
DIAGNOSIS: NEGATIVE
HPV (WINDOPATH): NOT DETECTED
Neisseria Gonorrhea: NEGATIVE

## 2016-09-09 ENCOUNTER — Ambulatory Visit (HOSPITAL_BASED_OUTPATIENT_CLINIC_OR_DEPARTMENT_OTHER): Payer: 59

## 2016-09-12 ENCOUNTER — Ambulatory Visit (HOSPITAL_BASED_OUTPATIENT_CLINIC_OR_DEPARTMENT_OTHER): Payer: 59

## 2016-09-17 ENCOUNTER — Encounter (HOSPITAL_BASED_OUTPATIENT_CLINIC_OR_DEPARTMENT_OTHER): Payer: Self-pay

## 2016-09-17 ENCOUNTER — Ambulatory Visit (HOSPITAL_BASED_OUTPATIENT_CLINIC_OR_DEPARTMENT_OTHER)
Admission: RE | Admit: 2016-09-17 | Discharge: 2016-09-17 | Disposition: A | Discharge status: Home / Self Care | Payer: 59 | Source: Ambulatory Visit | Attending: Obstetrics and Gynecology | Admitting: Obstetrics and Gynecology

## 2016-09-17 DIAGNOSIS — R928 Other abnormal and inconclusive findings on diagnostic imaging of breast: Secondary | ICD-10-CM | POA: Insufficient documentation

## 2016-09-17 DIAGNOSIS — Z1231 Encounter for screening mammogram for malignant neoplasm of breast: Secondary | ICD-10-CM | POA: Diagnosis not present

## 2016-09-19 ENCOUNTER — Other Ambulatory Visit: Payer: Self-pay | Admitting: Obstetrics and Gynecology

## 2016-09-19 DIAGNOSIS — R928 Other abnormal and inconclusive findings on diagnostic imaging of breast: Secondary | ICD-10-CM

## 2016-09-26 ENCOUNTER — Other Ambulatory Visit: Payer: 59

## 2016-10-03 ENCOUNTER — Ambulatory Visit
Admission: RE | Admit: 2016-10-03 | Discharge: 2016-10-03 | Disposition: A | Discharge status: Home / Self Care | Payer: 59 | Source: Ambulatory Visit | Attending: Obstetrics and Gynecology | Admitting: Obstetrics and Gynecology

## 2016-10-03 ENCOUNTER — Other Ambulatory Visit: Payer: 59

## 2016-10-03 DIAGNOSIS — R928 Other abnormal and inconclusive findings on diagnostic imaging of breast: Secondary | ICD-10-CM

## 2016-10-03 DIAGNOSIS — N6002 Solitary cyst of left breast: Secondary | ICD-10-CM | POA: Diagnosis not present

## 2016-10-03 DIAGNOSIS — R922 Inconclusive mammogram: Secondary | ICD-10-CM | POA: Diagnosis not present

## 2016-10-14 ENCOUNTER — Other Ambulatory Visit: Payer: Self-pay | Admitting: Obstetrics and Gynecology

## 2016-10-14 DIAGNOSIS — D251 Intramural leiomyoma of uterus: Secondary | ICD-10-CM | POA: Diagnosis not present

## 2016-10-14 DIAGNOSIS — N898 Other specified noninflammatory disorders of vagina: Secondary | ICD-10-CM | POA: Diagnosis not present

## 2016-10-14 DIAGNOSIS — Z3202 Encounter for pregnancy test, result negative: Secondary | ICD-10-CM | POA: Diagnosis not present

## 2016-10-14 DIAGNOSIS — N920 Excessive and frequent menstruation with regular cycle: Secondary | ICD-10-CM | POA: Diagnosis not present

## 2016-10-14 DIAGNOSIS — N858 Other specified noninflammatory disorders of uterus: Secondary | ICD-10-CM | POA: Diagnosis not present

## 2016-10-31 ENCOUNTER — Ambulatory Visit (HOSPITAL_COMMUNITY): Admission: RE | Admit: 2016-10-31 | Payer: 59 | Source: Ambulatory Visit | Admitting: Obstetrics and Gynecology

## 2016-10-31 ENCOUNTER — Encounter (HOSPITAL_COMMUNITY): Admission: RE | Payer: Self-pay | Source: Ambulatory Visit

## 2016-10-31 SURGERY — DILATATION & CURETTAGE/HYSTEROSCOPY WITH HYDROTHERMAL ABLATION
Anesthesia: Choice

## 2017-03-20 ENCOUNTER — Encounter: Payer: Self-pay | Admitting: Emergency Medicine

## 2017-05-12 ENCOUNTER — Telehealth: Payer: 59 | Admitting: Family

## 2017-05-12 DIAGNOSIS — J019 Acute sinusitis, unspecified: Secondary | ICD-10-CM

## 2017-05-12 MED ORDER — AMOXICILLIN-POT CLAVULANATE 875-125 MG PO TABS: 1.0000 | ORAL_TABLET | Freq: Two times a day (BID) | ORAL | 0 refills | Status: DC

## 2017-05-12 NOTE — Progress Notes (Signed)

## 2017-06-10 ENCOUNTER — Encounter: Payer: Self-pay | Admitting: *Deleted

## 2017-06-10 ENCOUNTER — Ambulatory Visit (INDEPENDENT_AMBULATORY_CARE_PROVIDER_SITE_OTHER): Payer: No Typology Code available for payment source | Admitting: Family Medicine

## 2017-06-10 ENCOUNTER — Other Ambulatory Visit: Payer: Self-pay

## 2017-06-10 ENCOUNTER — Encounter: Payer: Self-pay | Admitting: Family Medicine

## 2017-06-10 ENCOUNTER — Emergency Department (INDEPENDENT_AMBULATORY_CARE_PROVIDER_SITE_OTHER): Payer: No Typology Code available for payment source

## 2017-06-10 ENCOUNTER — Emergency Department
Admission: EM | Admit: 2017-06-10 | Discharge: 2017-06-10 | Disposition: A | Discharge status: Home / Self Care | Payer: No Typology Code available for payment source | Source: Home / Self Care | Attending: Family Medicine | Admitting: Family Medicine

## 2017-06-10 DIAGNOSIS — S62339A Displaced fracture of neck of unspecified metacarpal bone, initial encounter for closed fracture: Secondary | ICD-10-CM

## 2017-06-10 DIAGNOSIS — S62397A Other fracture of fifth metacarpal bone, left hand, initial encounter for closed fracture: Secondary | ICD-10-CM | POA: Diagnosis not present

## 2017-06-10 DIAGNOSIS — S62357A Nondisplaced fracture of shaft of fifth metacarpal bone, left hand, initial encounter for closed fracture: Secondary | ICD-10-CM

## 2017-06-10 DIAGNOSIS — X58XXXA Exposure to other specified factors, initial encounter: Secondary | ICD-10-CM

## 2017-06-10 MED ORDER — HYDROCODONE-ACETAMINOPHEN 5-325 MG PO TABS: 1.0000 | ORAL_TABLET | Freq: Four times a day (QID) | ORAL | 0 refills | Status: DC | PRN
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
## 2017-06-13 ENCOUNTER — Telehealth: Payer: Self-pay | Admitting: Sports Medicine

## 2017-06-13 ENCOUNTER — Ambulatory Visit (INDEPENDENT_AMBULATORY_CARE_PROVIDER_SITE_OTHER): Payer: No Typology Code available for payment source

## 2017-06-13 DIAGNOSIS — S62339A Displaced fracture of neck of unspecified metacarpal bone, initial encounter for closed fracture: Secondary | ICD-10-CM

## 2017-06-13 DIAGNOSIS — S62397D Other fracture of fifth metacarpal bone, left hand, subsequent encounter for fracture with routine healing: Secondary | ICD-10-CM | POA: Diagnosis not present

## 2017-06-13 NOTE — Telephone Encounter (Signed)
On further discussion with the patient she did not have any more pain, just some increased bruising and swelling, I advised her that this is expected after a fracture reduction, I reviewed her x-rays from today, fracture remains well aligned, reduction is anatomic.  Keep previously scheduled follow-up with Dr. Georgina Snell.

## 2017-06-13 NOTE — Telephone Encounter (Signed)
Will await xray. 

## 2017-06-13 NOTE — Telephone Encounter (Signed)
Pt called clinic concerned that her fracture has displaced again. Pt reports she saw Georgina Snell and he reduced it, but the pain and swelling are getting much worse. Pt is in a splint and has tried to keep it elevated, with no relief. Spoke with Provider in office, he would like an new xray to check bone placement. Order placed. Pt advised to go down to xray and to stay until Provider can review the report. Verbalized understanding.

## 2017-06-16 ENCOUNTER — Ambulatory Visit (INDEPENDENT_AMBULATORY_CARE_PROVIDER_SITE_OTHER): Payer: No Typology Code available for payment source

## 2017-06-16 ENCOUNTER — Ambulatory Visit (INDEPENDENT_AMBULATORY_CARE_PROVIDER_SITE_OTHER): Payer: No Typology Code available for payment source | Admitting: Family Medicine

## 2017-06-16 DIAGNOSIS — S62337D Displaced fracture of neck of fifth metacarpal bone, left hand, subsequent encounter for fracture with routine healing: Secondary | ICD-10-CM

## 2017-06-16 DIAGNOSIS — S62337A Displaced fracture of neck of fifth metacarpal bone, left hand, initial encounter for closed fracture: Secondary | ICD-10-CM

## 2017-06-16 DIAGNOSIS — S62309A Unspecified fracture of unspecified metacarpal bone, initial encounter for closed fracture: Secondary | ICD-10-CM | POA: Insufficient documentation

## 2017-06-16 NOTE — Patient Instructions (Signed)
Thank you for coming in today. Get xray now.  Recheck in 2 weeks if all is well/.  Return sooner if needed.    Cast or Splint Care, Adult Casts and splints are supports that are worn to protect broken bones and other injuries. A cast or splint may hold a bone still and in the correct position while it heals. Casts and splints may also help ease pain, swelling, and muscle spasms. A cast is a hardened support that is usually made of fiberglass or plaster. It is custom-fit to the body and it offers more protection than a splint. It cannot be taken off and put back on. A splint is a type of soft support that is usually made from cloth and elastic. It can be adjusted or taken off as needed. You may need a cast or a splint if you:  Have a broken bone.  Have a soft-tissue injury.  Need to keep an injured body part from moving (keep it immobile) after surgery.  How is this treated? If you have a cast:  Do not stick anything inside the cast to scratch your skin. Sticking something in the cast increases your risk of infection.  Check the skin around the cast every day. Tell your health care provider about any concerns.  You may put lotion on dry skin around the edges of the cast. Do not put lotion on the skin underneath the cast.  Keep the cast clean.  If the cast is not waterproof: ? Do not let it get wet. ? Cover it with a watertight covering when you take a bath or a shower. If you have a splint:  Wear it as told by your health care provider. Remove it only as told by your health care provider.  Loosen the splint if your fingers or toes tingle, become numb, or turn cold and blue.  Keep the splint clean.  If the splint is not waterproof: ? Do not let it get wet. ? Cover it with a watertight covering when you take a bath or a shower. Bathing  Do not take baths or swim until your health care provider approves. Ask your health care provider if you can take showers. You may only be  allowed to take sponge baths for bathing.  If your cast or splint is not waterproof, cover it with a watertight covering when you take a bath or shower. Managing pain, stiffness, and swelling  Move your fingers or toes often to avoid stiffness and to lessen swelling.  Raise (elevate) the injured area above the level of your heart while sitting or lying down. Safety  Do not use the injured limb to support your body weight until your health care provider says that it is okay.  Use crutches or other assistive devices as told by your health care provider. General instructions  Do not put pressure on any part of the cast or splint until it is fully hardened. This may take several hours.  Return to your normal activities as told by your health care provider. Ask your health care provider what activities are safe for you.  Take over-the-counter and prescription medicines only as told by your health care provider.  Keep all follow-up visits as told by your health care provider. This is important. Contact a health care provider if:  Your cast or splint gets damaged.  The skin around the cast gets red or raw.  The skin under the cast is extremely itchy or painful.  Your  cast or splint feels very uncomfortable.  Your cast or splint is too tight or too loose.  Your cast becomes wet or it develops a soft spot or area.  You get an object stuck under your cast. Get help right away if:  Your pain is getting worse.  The injured area tingles, becomes numb, or turns cold and blue.  The part of your body above or below the cast is swollen and discolored.  You cannot feel or move your fingers or toes.  There is fluid leaking through the cast.  You have severe pain or pressure under the cast.  You have trouble breathing.  You have shortness of breath.  You have chest pain. This information is not intended to replace advice given to you by your health care provider. Make sure you  discuss any questions you have with your health care provider. Document Released: 03/15/2000 Document Revised: 10/07/2015 Document Reviewed: 09/09/2015 Elsevier Interactive Patient Education  [employer].

## 2017-06-16 NOTE — Progress Notes (Signed)
Joanne Wilson is a 47 y.o. female who presents to [place] today for follow-up of left metacarpal neck fracture.  Patient was seen on March 12 for displaced metacarpal neck fracture requiring reduction and splinting.  Following reduction the fracture continued to have some displacement but no significant lesion.  She had worsening pain last week and had an x-ray in splint on Friday March 15 showing no further displacement.  She notes the pain is been significantly improved over the weekend presents to clinic today for recheck.  She feels a lot better.   Past Medical History:  Diagnosis Date  . Family history of breast cancer    Past Surgical History:  Procedure Laterality Date  . AUGMENTATION MAMMAPLASTY    . TUBAL LIGATION     Social History   Tobacco Use  . Smoking status: Never Smoker  . Smokeless tobacco: Never Used  Substance Use Topics  . Alcohol use: No     ROS:  As above   Medications: Current Outpatient Medications  Medication Sig Dispense Refill  . acetaminophen (TYLENOL) 500 MG tablet Take 1,000 mg by mouth every 6 (six) hours as needed for mild pain.    . cyclobenzaprine (FLEXERIL) 5 MG tablet Take 1 tablet (5 mg total) by mouth 2 (two) times daily as needed for muscle spasms. 10 tablet 0  . HYDROcodone-acetaminophen (NORCO/VICODIN) 5-325 MG tablet Take 1 tablet by mouth every 6 (six) hours as needed (cough). 12 tablet 0  . ibuprofen (ADVIL,MOTRIN) 200 MG tablet Take 200-800 mg by mouth every 6 (six) hours as needed for mild pain.    . methocarbamol (ROBAXIN) 500 MG tablet Take 1 tablet (500 mg total) by mouth 2 (two) times daily. 20 tablet 0   No current facility-administered medications for this visit.    No Known Allergies   Exam:  BP 128/82   Pulse 98   Ht 5\' 7"  (1.702 m)   Wt 205 lb (93 kg)   LMP 05/25/2017   BMI 32.11 kg/m  General: Well Developed, well nourished, and in no acute distress.    Neuro/Psych: Alert and oriented x3, extra-ocular muscles intact, able to move all 4 extremities, sensation grossly intact. Skin: Warm and dry, no rashes noted.  Respiratory: Not using accessory muscles, speaking in full sentences, trachea midline.  Cardiovascular: Pulses palpable, no extremity edema. Abdomen: Does not appear distended. MSK: Left hand no rotational deformity nontender no palpable displacement.  Motion not tested.  Capillary refill and sensation intact distally.  Ulnar gutter cast applied  Patient sent to x-ray for recheck in the cast for fear of exacerbating displacement put pf cast.  We discussed the possibility of circulerization the splint patient declined and said she would rather take the splint off and have a new cast applied.   Independent review of repeat hand x-ray in cast reveals no further displacement compared to what is previously described.  Awaiting formal radiology review.  No results found for this or any previous visit (from the past 48 hour(s)). Dg Hand Complete Left  Result Date: 06/13/2017 CLINICAL DATA:  Closed boxer fracture.  Subsequent encounter EXAM: LEFT HAND - COMPLETE 3+ VIEW COMPARISON:  Three days prior FINDINGS: Stable positioning of fifth metacarpal shaft/neck fracture with 1-2 mm of volar and radial displacement. No new abnormality. IMPRESSION: Fifth metacarpal fracture with stable 1-2 mm of radial and volar displacement. Electronically Signed   By: Monte Fantasia M.D.   On: 06/13/2017 15:46  Assessment and Plan: 47 y.o. female with fifth metacarpal neck fracture.  Clinically improved.  Casted today.  Recheck in 2 weeks or sooner if needed.    Orders Placed This Encounter  Procedures  . DG Hand Complete Left    Standing Status:   Future    Number of Occurrences:   1    Standing Expiration Date:   08/17/2018    Order Specific Question:   Reason for Exam (SYMPTOM  OR DIAGNOSIS REQUIRED)    Answer:   eval fracture in cast. Did it  shift during casting    Order Specific Question:   Is patient pregnant?    Answer:   No    Order Specific Question:   Preferred imaging location?    Answer:   [facility]    Order Specific Question:   Radiology Contrast Protocol - do NOT remove file path    Answer:   [email or web address]   No orders of the defined types were placed in this encounter.   Discussed warning signs or symptoms. Please see discharge instructions. Patient expresses understanding.  Global service charge used.

## 2017-07-04 ENCOUNTER — Ambulatory Visit (INDEPENDENT_AMBULATORY_CARE_PROVIDER_SITE_OTHER): Payer: No Typology Code available for payment source

## 2017-07-04 ENCOUNTER — Ambulatory Visit (INDEPENDENT_AMBULATORY_CARE_PROVIDER_SITE_OTHER): Payer: No Typology Code available for payment source | Admitting: Family Medicine

## 2017-07-04 ENCOUNTER — Encounter: Payer: Self-pay | Admitting: Family Medicine

## 2017-07-04 VITALS — BP 138/81 | HR 79 | Wt 205.0 lb

## 2017-07-04 DIAGNOSIS — S62337D Displaced fracture of neck of fifth metacarpal bone, left hand, subsequent encounter for fracture with routine healing: Secondary | ICD-10-CM

## 2017-07-04 DIAGNOSIS — S62337A Displaced fracture of neck of fifth metacarpal bone, left hand, initial encounter for closed fracture: Secondary | ICD-10-CM | POA: Diagnosis not present

## 2017-07-04 DIAGNOSIS — W51XXXD Accidental striking against or bumped into by another person, subsequent encounter: Secondary | ICD-10-CM | POA: Diagnosis not present

## 2017-07-04 NOTE — Progress Notes (Signed)
Joanne Wilson is a 47 y.o. female who presents to [facility] today for follow-up of left metacarpal neck fracture.  Patient was seen on March 12 for displaced metacarpal neck fracture requiring reduction and splinting.  Following reduction the fracture continued to have some displacement but no significant lesion.  She had worsening pain last week and had an x-ray in splint on Friday March 15 showing no further displacement.  She had repeat casting on 3/18 and is here for follow up today. She denies any pain.    Past Medical History:  Diagnosis Date  . Family history of breast cancer    Past Surgical History:  Procedure Laterality Date  . AUGMENTATION MAMMAPLASTY    . TUBAL LIGATION     Social History   Tobacco Use  . Smoking status: Never Smoker  . Smokeless tobacco: Never Used  Substance Use Topics  . Alcohol use: No     ROS:  As above   Medications: Current Outpatient Medications  Medication Sig Dispense Refill  . acetaminophen (TYLENOL) 500 MG tablet Take 1,000 mg by mouth every 6 (six) hours as needed for mild pain.    . cyclobenzaprine (FLEXERIL) 5 MG tablet Take 1 tablet (5 mg total) by mouth 2 (two) times daily as needed for muscle spasms. 10 tablet 0  . ibuprofen (ADVIL,MOTRIN) 200 MG tablet Take 200-800 mg by mouth every 6 (six) hours as needed for mild pain.    . methocarbamol (ROBAXIN) 500 MG tablet Take 1 tablet (500 mg total) by mouth 2 (two) times daily. 20 tablet 0   No current facility-administered medications for this visit.    No Known Allergies   Exam:  BP 138/81   Pulse 79   Wt 205 lb (93 kg)   LMP 06/13/2017   BMI 32.11 kg/m  General: Well Developed, well nourished, and in no acute distress.  Neuro/Psych: Alert and oriented x3, extra-ocular muscles intact, able to move all 4 extremities, sensation grossly intact. Skin: Warm and dry, no rashes noted.  Respiratory: Not using accessory muscles,  speaking in full sentences, trachea midline.  Cardiovascular: Pulses palpable, no extremity edema. Abdomen: Does not appear distended. MSK: Left hand no rotational deformity.  The fifth digit is foreshortened.  Motion not tested.  Capillary refill and sensation intact distally.  X-ray left hand my personal interpretation of the images show a further shortening and displacement of the distal fracture at the metacarpal neck with minimal callus formation. Awaiting radiology review  New ulnar gutter cast applied.   Assessment and Plan: 47 y.o. female with fifth metacarpal neck fracture.  Displacement and shortening is worsening.  We had a lengthy discussion about treatment options.  At this point she is not going to have an excellent outcome with conservative management.  I think it is reasonable to have a second opinion with hand surgery for potential surgical evaluation.  Referral placed.  If continued casting is warranted I will be happy to continue to provide serial casting.    Orders Placed This Encounter  Procedures  . DG Hand Complete Left    Standing Status:   Future    Number of Occurrences:   1    Standing Expiration Date:   09/04/2018    Order Specific Question:   Reason for Exam (SYMPTOM  OR DIAGNOSIS REQUIRED)    Answer:   follow up 5th metacarpal fracture    Order Specific Question:   Is patient pregnant?  Answer:   No    Order Specific Question:   Preferred imaging location?    Answer:   [facility]    Order Specific Question:   Radiology Contrast Protocol - do NOT remove file path    Answer:   [email or web address]  . Ambulatory referral to Hand Surgery    Referral Priority:   Routine    Referral Type:   Surgical    Referral Reason:   Specialty Services Required    Requested Specialty:   Hand Surgery    Number of Visits Requested:   1   No orders of the defined types were placed in this encounter.   Discussed warning  signs or symptoms. Please see discharge instructions. Patient expresses understanding.  Global service charge used.

## 2017-07-04 NOTE — Patient Instructions (Signed)
Thank you for coming in today. You should hear from hand surgery.  Recheck as needed.  Let me know if you have any problems.

## 2017-07-09 ENCOUNTER — Encounter (HOSPITAL_BASED_OUTPATIENT_CLINIC_OR_DEPARTMENT_OTHER): Payer: Self-pay | Admitting: *Deleted

## 2017-07-09 ENCOUNTER — Other Ambulatory Visit: Payer: Self-pay

## 2017-07-09 NOTE — Progress Notes (Signed)
SPOKE W/ PT VIA PHONE FOR PRE-OP INTERVIEW.  NPO AFTER MN W/ EXCEPTION CLEAR LIQUIDS UNTIL 0745 (NO CREAM [place] PRODUCTS).  ARRIVE AT 1145.  WILL TAKE ZANTAC AM DOS W/ SIPS OF WATER AND IF NEEDED TAKE TYLENOL. PRE-OP ORDERS PENDING.

## 2017-07-10 ENCOUNTER — Ambulatory Visit (HOSPITAL_BASED_OUTPATIENT_CLINIC_OR_DEPARTMENT_OTHER): Payer: No Typology Code available for payment source | Admitting: Anesthesiology

## 2017-07-10 ENCOUNTER — Encounter (HOSPITAL_BASED_OUTPATIENT_CLINIC_OR_DEPARTMENT_OTHER): Payer: Self-pay

## 2017-07-10 ENCOUNTER — Ambulatory Visit (HOSPITAL_COMMUNITY): Payer: No Typology Code available for payment source

## 2017-07-10 ENCOUNTER — Ambulatory Visit (HOSPITAL_BASED_OUTPATIENT_CLINIC_OR_DEPARTMENT_OTHER)
Admission: RE | Admit: 2017-07-10 | Discharge: 2017-07-10 | Disposition: A | Discharge status: Home / Self Care | Payer: No Typology Code available for payment source | Source: Ambulatory Visit | Attending: Orthopedic Surgery | Admitting: Orthopedic Surgery

## 2017-07-10 ENCOUNTER — Encounter (HOSPITAL_BASED_OUTPATIENT_CLINIC_OR_DEPARTMENT_OTHER)
Admission: RE | Disposition: A | Discharge status: Home / Self Care | Payer: Self-pay | Source: Ambulatory Visit | Attending: Orthopedic Surgery

## 2017-07-10 DIAGNOSIS — Z79899 Other long term (current) drug therapy: Secondary | ICD-10-CM | POA: Insufficient documentation

## 2017-07-10 DIAGNOSIS — S62337A Displaced fracture of neck of fifth metacarpal bone, left hand, initial encounter for closed fracture: Secondary | ICD-10-CM | POA: Diagnosis present

## 2017-07-10 DIAGNOSIS — Z8249 Family history of ischemic heart disease and other diseases of the circulatory system: Secondary | ICD-10-CM | POA: Diagnosis not present

## 2017-07-10 DIAGNOSIS — K219 Gastro-esophageal reflux disease without esophagitis: Secondary | ICD-10-CM | POA: Diagnosis not present

## 2017-07-10 DIAGNOSIS — S62201A Unspecified fracture of first metacarpal bone, right hand, initial encounter for closed fracture: Secondary | ICD-10-CM

## 2017-07-10 DIAGNOSIS — Z803 Family history of malignant neoplasm of breast: Secondary | ICD-10-CM | POA: Insufficient documentation

## 2017-07-10 DIAGNOSIS — X58XXXA Exposure to other specified factors, initial encounter: Secondary | ICD-10-CM | POA: Diagnosis not present

## 2017-07-10 DIAGNOSIS — T148XXA Other injury of unspecified body region, initial encounter: Secondary | ICD-10-CM

## 2017-07-10 HISTORY — DX: Fracture of unspecified phalanx of unspecified finger, initial encounter for closed fracture: S62.609A

## 2017-07-10 HISTORY — DX: Other specified postprocedural states: Z98.890

## 2017-07-10 HISTORY — DX: Gastro-esophageal reflux disease without esophagitis: K21.9

## 2017-07-10 HISTORY — PX: OPEN REDUCTION INTERNAL FIXATION (ORIF) METACARPAL: SHX6234

## 2017-07-10 HISTORY — DX: Nausea with vomiting, unspecified: R11.2

## 2017-07-10 SURGERY — OPEN REDUCTION INTERNAL FIXATION (ORIF) METACARPAL
Anesthesia: Monitor Anesthesia Care | Laterality: Left

## 2017-07-10 MED ORDER — HYDROCODONE-ACETAMINOPHEN 5-300 MG PO TABS: 1.0000 | ORAL_TABLET | Freq: Four times a day (QID) | ORAL | 0 refills | Status: DC | PRN

## 2017-07-10 MED ORDER — LIDOCAINE 2% (20 MG/ML) 5 ML SYRINGE
INTRAMUSCULAR | Status: AC
  Filled 2017-07-10: qty 5

## 2017-07-10 MED ORDER — FENTANYL CITRATE (PF) 100 MCG/2ML IJ SOLN
INTRAMUSCULAR | Status: AC
  Filled 2017-07-10: qty 2

## 2017-07-10 MED ORDER — CHLORHEXIDINE GLUCONATE 4 % EX LIQD
60.0000 mL | Freq: Once | CUTANEOUS | Status: DC
  Filled 2017-07-10: qty 118

## 2017-07-10 MED ORDER — ONDANSETRON HCL 4 MG/2ML IJ SOLN
4.0000 mg | Freq: Once | INTRAMUSCULAR | Status: DC | PRN
  Filled 2017-07-10: qty 2

## 2017-07-10 MED ORDER — MEPERIDINE HCL 25 MG/ML IJ SOLN
6.2500 mg | INTRAMUSCULAR | Status: DC | PRN
  Filled 2017-07-10: qty 1

## 2017-07-10 MED ORDER — CEFAZOLIN SODIUM-DEXTROSE 2-4 GM/100ML-% IV SOLN
2.0000 g | INTRAVENOUS | Status: AC
  Administered 2017-07-10: 2 g via INTRAVENOUS
  Filled 2017-07-10: qty 100

## 2017-07-10 MED ORDER — FENTANYL CITRATE (PF) 100 MCG/2ML IJ SOLN
INTRAMUSCULAR | Status: DC | PRN
  Administered 2017-07-10: 50 ug via INTRAVENOUS

## 2017-07-10 MED ORDER — PROPOFOL 500 MG/50ML IV EMUL
INTRAVENOUS | Status: AC
  Filled 2017-07-10: qty 50

## 2017-07-10 MED ORDER — FENTANYL CITRATE (PF) 100 MCG/2ML IJ SOLN
100.0000 ug | Freq: Once | INTRAMUSCULAR | Status: AC
  Administered 2017-07-10: 100 ug via INTRAVENOUS
  Filled 2017-07-10: qty 2

## 2017-07-10 MED ORDER — CEFAZOLIN SODIUM-DEXTROSE 2-4 GM/100ML-% IV SOLN
INTRAVENOUS | Status: AC
  Filled 2017-07-10: qty 100

## 2017-07-10 MED ORDER — BUPIVACAINE-EPINEPHRINE (PF) 0.5% -1:200000 IJ SOLN
INTRAMUSCULAR | Status: DC | PRN
  Administered 2017-07-10: 30 mL via PERINEURAL

## 2017-07-10 MED ORDER — LACTATED RINGERS IV SOLN
INTRAVENOUS | Status: DC
  Administered 2017-07-10 (×2): via INTRAVENOUS
  Filled 2017-07-10: qty 1000

## 2017-07-10 MED ORDER — HYDROMORPHONE HCL 1 MG/ML IJ SOLN
0.2500 mg | INTRAMUSCULAR | Status: DC | PRN
  Filled 2017-07-10: qty 0.5

## 2017-07-10 MED ORDER — LIDOCAINE 2% (20 MG/ML) 5 ML SYRINGE
INTRAMUSCULAR | Status: DC | PRN
  Administered 2017-07-10: 50 mg via INTRAVENOUS

## 2017-07-10 MED ORDER — PROPOFOL 500 MG/50ML IV EMUL
INTRAVENOUS | Status: DC | PRN
  Administered 2017-07-10: 50 ug/kg/min via INTRAVENOUS

## 2017-07-10 MED ORDER — MIDAZOLAM HCL 2 MG/2ML IJ SOLN
INTRAMUSCULAR | Status: AC
  Filled 2017-07-10: qty 2

## 2017-07-10 MED ORDER — MIDAZOLAM HCL 2 MG/2ML IJ SOLN
2.0000 mg | Freq: Once | INTRAMUSCULAR | Status: AC
  Administered 2017-07-10: 2 mg via INTRAVENOUS
  Filled 2017-07-10: qty 2

## 2017-07-10 SURGICAL SUPPLY — 68 items
BANDAGE ACE 4X5 VEL STRL LF (GAUZE/BANDAGES/DRESSINGS) ×3 IMPLANT
BANDAGE CONFORM 3  STR LF (GAUZE/BANDAGES/DRESSINGS) IMPLANT
BANDAGE ELASTIC 3 VELCRO ST LF (GAUZE/BANDAGES/DRESSINGS) ×3 IMPLANT
BANDAGE ELASTIC 4 VELCRO ST LF (GAUZE/BANDAGES/DRESSINGS) IMPLANT
BANDAGE GAUZE ELAST BULKY 4 IN (GAUZE/BANDAGES/DRESSINGS) IMPLANT
BENZOIN TINCTURE PRP APPL 2/3 (GAUZE/BANDAGES/DRESSINGS) IMPLANT
BLADE SURG 15 STRL LF DISP TIS (BLADE) ×1 IMPLANT
BLADE SURG 15 STRL SS (BLADE) ×2
BNDG COHESIVE 3X5 TAN STRL LF (GAUZE/BANDAGES/DRESSINGS) IMPLANT
BNDG CONFORM 3 STRL LF (GAUZE/BANDAGES/DRESSINGS) ×3 IMPLANT
BNDG ESMARK 4X9 LF (GAUZE/BANDAGES/DRESSINGS) IMPLANT
BNDG GAUZE ELAST 4 BULKY (GAUZE/BANDAGES/DRESSINGS) IMPLANT
CLOSURE WOUND 1/2 X4 (GAUZE/BANDAGES/DRESSINGS)
CORDS BIPOLAR (ELECTRODE) IMPLANT
COVER BACK TABLE 60X90IN (DRAPES) ×3 IMPLANT
CUFF TOURNIQUET SINGLE 18IN (TOURNIQUET CUFF) ×3 IMPLANT
CUFF TOURNIQUET SINGLE 24IN (TOURNIQUET CUFF) IMPLANT
DRAIN PENROSE 18X1/4 LTX STRL (WOUND CARE) IMPLANT
DRAPE EXTREMITY T 121X128X90 (DRAPE) ×3 IMPLANT
DRAPE OEC MINIVIEW 54X84 (DRAPES) IMPLANT
DRAPE SURG 17X23 STRL (DRAPES) IMPLANT
DRSG EMULSION OIL 3X3 NADH (GAUZE/BANDAGES/DRESSINGS) IMPLANT
ELECT NEEDLE TIP 2.8 STRL (NEEDLE) IMPLANT
ELECT REM PT RETURN 9FT ADLT (ELECTROSURGICAL) ×3
ELECTRODE REM PT RTRN 9FT ADLT (ELECTROSURGICAL) ×1 IMPLANT
GAUZE SPONGE 4X4 12PLY STRL (GAUZE/BANDAGES/DRESSINGS) ×3 IMPLANT
GAUZE SPONGE 4X4 16PLY XRAY LF (GAUZE/BANDAGES/DRESSINGS) IMPLANT
GAUZE XEROFORM 1X8 LF (GAUZE/BANDAGES/DRESSINGS) ×6 IMPLANT
GLOVE BIO SURGEON STRL SZ 6.5 (GLOVE) ×2 IMPLANT
GLOVE BIO SURGEON STRL SZ8 (GLOVE) ×3 IMPLANT
GLOVE BIO SURGEONS STRL SZ 6.5 (GLOVE) ×1
GLOVE BIOGEL PI IND STRL 7.0 (GLOVE) ×1 IMPLANT
GLOVE BIOGEL PI INDICATOR 7.0 (GLOVE) ×2
GOWN STRL REUS W/TWL LRG LVL3 (GOWN DISPOSABLE) ×3 IMPLANT
GOWN STRL REUS W/TWL XL LVL3 (GOWN DISPOSABLE) ×3 IMPLANT
K WIRE ×4 IMPLANT
K-WIRE .035X4 (WIRE) IMPLANT
K-WIRE .045X4 (WIRE) IMPLANT
KIT TURNOVER CYSTO (KITS) ×3 IMPLANT
KWIRE ×3 IMPLANT
LOOP VESSEL MAXI BLUE (MISCELLANEOUS) IMPLANT
MANIFOLD NEPTUNE II (INSTRUMENTS) IMPLANT
NEEDLE HYPO 25X1 1.5 SAFETY (NEEDLE) IMPLANT
NS IRRIG 500ML POUR BTL (IV SOLUTION) ×3 IMPLANT
PACK BASIN DAY SURGERY FS (CUSTOM PROCEDURE TRAY) ×3 IMPLANT
PAD CAST 3X4 CTTN HI CHSV (CAST SUPPLIES) ×1 IMPLANT
PAD CAST 4YDX4 CTTN HI CHSV (CAST SUPPLIES) ×1 IMPLANT
PADDING CAST ABS 4INX4YD NS (CAST SUPPLIES) ×2
PADDING CAST ABS COTTON 4X4 ST (CAST SUPPLIES) ×1 IMPLANT
PADDING CAST COTTON 3X4 STRL (CAST SUPPLIES) ×2
PADDING CAST COTTON 4X4 STRL (CAST SUPPLIES) ×2
PENCIL BUTTON HOLSTER BLD 10FT (ELECTRODE) IMPLANT
SPLINT PLASTER CAST XFAST 3X15 (CAST SUPPLIES) IMPLANT
SPLINT PLASTER CAST XFAST 4X15 (CAST SUPPLIES) IMPLANT
SPLINT PLASTER XTRA FAST SET 4 (CAST SUPPLIES)
SPLINT PLASTER XTRA FASTSET 3X (CAST SUPPLIES)
STOCKINETTE 4X48 STRL (DRAPES) ×3 IMPLANT
STRIP CLOSURE SKIN 1/2X4 (GAUZE/BANDAGES/DRESSINGS) IMPLANT
SUT ETHILON 4 0 P 3 18 (SUTURE) IMPLANT
SUT ETHILON 5 0 P 3 18 (SUTURE)
SUT NYLON ETHILON 5-0 P-3 1X18 (SUTURE) IMPLANT
SYR BULB 3OZ (MISCELLANEOUS) ×3 IMPLANT
SYR CONTROL 10ML LL (SYRINGE) IMPLANT
TOWEL OR 17X24 6PK STRL BLUE (TOWEL DISPOSABLE) ×6 IMPLANT
TUBE CONNECTING 12'X1/4 (SUCTIONS)
TUBE CONNECTING 12X1/4 (SUCTIONS) IMPLANT
UNDERPAD 30X30 (UNDERPADS AND DIAPERS) ×3 IMPLANT
WATER STERILE IRR 500ML POUR (IV SOLUTION) ×3 IMPLANT

## 2017-07-10 NOTE — H&P (Signed)
Joanne Wilson is an 47 y.o. female.   Chief Complaint: Left small finger metacarpal fracture HPI: Pt seen/evaluated in office Pt with displaced left small finger metacarpal fracture Pt here for surgey No prior surgery to the hand  Past Medical History:  Diagnosis Date  . Family history of breast cancer   . Finger fracture, left    5th digit (small finger)  . GERD (gastroesophageal reflux disease)   . PONV (postoperative nausea and vomiting)     Past Surgical History:  Procedure Laterality Date  . AUGMENTATION MAMMAPLASTY Bilateral 2014  . TUBAL LIGATION Bilateral 2005   PPTL    Family History  Problem Relation Age of Onset  . Breast cancer Mother 89  . Yves Dill Parkinson White syndrome Brother   . Breast cancer Maternal Grandmother 78  . Breast cancer Other   . Breast cancer Cousin        mother's maternal first cousin dx 2x under 48  . Heart failure Father   . Hypertension Father    Social History:  reports that she has never smoked. She has never used smokeless tobacco. She reports that she does not drink alcohol or use drugs.  Allergies: No Known Allergies  Medications Prior to Admission  Medication Sig Dispense Refill  . sertraline (ZOLOFT) 50 MG tablet Take 25 mg by mouth daily as needed (for sweating).    Marland Kitchen acetaminophen (TYLENOL) 500 MG tablet Take 1,000 mg by mouth every 6 (six) hours as needed for mild pain.    Marland Kitchen ibuprofen (ADVIL,MOTRIN) 200 MG tablet Take 800 mg by mouth every 6 (six) hours as needed for mild pain.     . ranitidine (ZANTAC) 150 MG tablet Take 150 mg by mouth 2 (two) times daily as needed for heartburn.      No results found for this or any previous visit (from the past 48 hour(s)). No results found.  ROS NO RECENT ILLNESSES OR HOSPITALIZATIONS  Blood pressure 122/83, pulse (!) 102, temperature 98.7 F (37.1 C), temperature source Oral, resp. rate (!) 27, height 5\' 7"  (1.702 m), weight 199 lb 9.6 oz (90.5 kg), last menstrual period  06/18/2017, SpO2 98 %. Physical Exam  General Appearance:  Alert, cooperative, no distress, appears stated age  Head:  Normocephalic, without obvious abnormality, atraumatic  Eyes:  Pupils equal, conjunctiva/corneas clear,         Throat: Lips, mucosa, and tongue normal; teeth and gums normal  Neck: No visible masses     Lungs:   respirations unlabored  Chest Wall:  No tenderness or deformity  Heart:  Regular rate and rhythm,  Abdomen:   Soft, non-tender,         Extremities: Left hand: skin intact, fingers warm well perfused Good cap refil Good wrist motion Limited motion of fingers  Pulses: 2+ and symmetric  Skin: Skin color, texture, turgor normal, no rashes or lesions     Neurologic: Normal    Assessment/Plan LEFT SMALL FINGER METACARPAL NECK/SHAFT FRACTURE DISPLACED  LEFT SMALL FINGER OPEN REDUCTION AND INTERNAL FIXATION AND REPAIR AS INDICATED  R/B/A DISCUSSED WITH PT IN OFFICE.  PT VOICED UNDERSTANDING OF PLAN CONSENT SIGNED DAY OF SURGERY PT SEEN AND EXAMINED PRIOR TO OPERATIVE PROCEDURE/DAY OF SURGERY SITE MARKED. QUESTIONS ANSWERED WILL GO HOME FOLLOWING SURGERY  WE ARE PLANNING SURGERY FOR YOUR UPPER EXTREMITY. THE RISKS AND BENEFITS OF SURGERY INCLUDE BUT NOT LIMITED TO BLEEDING INFECTION, DAMAGE TO NEARBY NERVES ARTERIES TENDONS, FAILURE OF SURGERY TO ACCOMPLISH ITS INTENDED GOALS,  PERSISTENT SYMPTOMS AND NEED FOR FURTHER SURGICAL INTERVENTION. WITH THIS IN MIND WE WILL PROCEED. I HAVE DISCUSSED WITH THE PATIENT THE PRE AND POSTOPERATIVE REGIMEN AND THE DOS AND DON'TS. PT VOICED UNDERSTANDING AND INFORMED CONSENT SIGNED.  Linna Hoff 07/10/2017, 2:01 PM

## 2017-07-10 NOTE — Anesthesia Procedure Notes (Signed)
Anesthesia Regional Block: Supraclavicular block   Pre-Anesthetic Checklist: ,, timeout performed, Correct Patient, Correct Site, Correct Laterality, Correct Procedure, Correct Position, site marked, Risks and benefits discussed,  Surgical consent,  Pre-op evaluation,  At surgeon's request and post-op pain management  Laterality: Left  Prep: chloraprep       Needles:   Needle Type: Echogenic Stimulator Needle     Needle Length: 9cm  Needle Gauge: 21     Additional Needles:   Procedures:, nerve stimulator,,,,,,,   Nerve Stimulator or Paresthesia:  Response: 0.4 mA,   Additional Responses:   Narrative:  Start time: 07/10/2017 1:10 PM End time: 07/10/2017 1:20 PM Injection made incrementally with aspirations every 5 mL.  Performed by: Personally  Anesthesiologist: Lillia Abed, MD  Additional Notes: Monitors applied. Patient sedated. Sterile prep and drape,hand hygiene and sterile gloves were used. Relevant anatomy identified.Needle position confirmed.Local anesthetic injected incrementally after negative aspiration. Local anesthetic spread visualized around nerve(s). Vascular puncture avoided. No complications. Image printed for medical record.The patient tolerated the procedure well.

## 2017-07-10 NOTE — Progress Notes (Signed)
1311 time out for block procedure with Dr. Conrad [place] , patient and Nurse. [phone]. Versed 2 mg IV and Fentanyl 100 mcg IV given for sedation. 1321 block completed tolerated well.

## 2017-07-10 NOTE — Anesthesia Postprocedure Evaluation (Addendum)
Anesthesia Post Note  Patient: Joanne Wilson  Procedure(s) Performed: OPEN REDUCTION INTERNAL FIXATION (ORIF) METACARPAL (Left )     Patient location during evaluation: PACU Anesthesia Type: MAC and Regional Level of consciousness: awake and alert and oriented Pain management: pain level controlled Vital Signs Assessment: post-procedure vital signs reviewed and stable Respiratory status: spontaneous breathing, nonlabored ventilation and respiratory function stable Cardiovascular status: blood pressure returned to baseline and stable Postop Assessment: no apparent nausea or vomiting Anesthetic complications: no    Last Vitals:  Vitals:   07/10/17 1615 07/10/17 1630  BP: 124/83 (!) 127/91  Pulse: (!) 57 61  Resp: 18 16  Temp:    SpO2: 98% 96%    Last Pain:  Vitals:   07/10/17 1630  TempSrc:   PainSc: 0-No pain                 Natalie Leclaire A.

## 2017-07-10 NOTE — Transfer of Care (Signed)
Immediate Anesthesia Transfer of Care Note  Patient: Joanne Wilson  Procedure(s) Performed: OPEN REDUCTION INTERNAL FIXATION (ORIF) METACARPAL (Left )  Patient Location: PACU  Anesthesia Type:MAC and Regional  Level of Consciousness: awake, alert  and oriented  Airway & Oxygen Therapy: Patient Spontanous Breathing and Patient connected to nasal cannula oxygen  Post-op Assessment: Report given to RN  Post vital signs: Reviewed and stable  Last Vitals:  Vitals Value Taken Time  BP 118/83 07/10/2017  3:53 PM  Temp    Pulse 89 07/10/2017  3:57 PM  Resp 21 07/10/2017  3:57 PM  SpO2 98 % 07/10/2017  3:57 PM  Vitals shown include unvalidated device data.  Last Pain:  Vitals:   07/10/17 1222  TempSrc: Oral      Patients Stated Pain Goal: 4 (24/49/75 3005)  Complications: No apparent anesthesia complications

## 2017-07-10 NOTE — Op Note (Signed)
PREOPERATIVE DIAGNOSIS:Left small finger metacarpal neck fracture   POSTOPERATIVE DIAGNOSIS:same  ATTENDING SURGEON: Dr. Gavin Pound who was scrubbed and present for the entire procedure  ASSISTANT SURGEON:none  ANESTHESIA:block with IV sedation  OPERATIVE PROCEDURE: #1: Open treatment of left small finger metacarpal neck fracture requiring internal fixation #2: radiographs 3 views left hand  IMPLANTS:3 K wires, 2 0.045 K wire and one 0.035 K wire  RADIOGRAPHIC INTERPRETATION:AP lateral oblique views of the hand do show the K wire fixation and good position  SURGICAL INDICATIONS:patient is a right-hand-dominant female who is a displaced metacarpal neck fracture. Patient was seen and evaluated in the office and recommended undergo the above procedure. Risks of surgery include but not limited to bleeding infection damage to nearby nerves arteries or tendons nonunion malunion hardware failure and need for further surgical intervention  SURGICAL TECHNIQUE:The patient was properly identified in the preoperative holding area marked with a permanent marker made on the left hand indicate correct operative site. Patient and brought back to operating room placed supine on anesthesia and table where the regional anesthesia was administered. Patient tolerated this well. A well-padded tourniquet was then placed on the left brachium and sealed with the appropriate drape. Left upper extremities and prepped and draped in normal sterile fashion. Timeout was called the correct site was identified and the procedure then begun.  A longitudinal incision made directly over the dorsal aspect of the small finger metacarpal. Dissection was then carried down through the skin and subcutaneous tissue. The extensor interval was split longitudinally going between the EDQ and the [facility] to the small finger joint capsulotomy carried out to expose the fracture site over the very distal nature of the neck fracture. Takedown of the  met early nascent malunion was then carried out. Thorough wound irrigation done. After the wound was irrigated and the fracture site taken down open reduction was then performed and 2 0.045 K wires were then placed across the fracture site with good purchase across the opposite engaging cortices. An additional 0.035 K wire was then placed for additional fixation. The K wires were then cut and bent left out of the skin. Final images were then obtained large using the large image intensifier. The extensor interval was then closed with mersilene suture. The capsule closed with Monocryl suture. The skin was then closed using simple Prolene suture. K wires and the cut and bent left out of the skin Xeroform was then placed around the pin sites. Sterile compressive bandage then applied. The patient is then placed an ulnar gutter splint taken recovery room in good condition.  POSTOPERATIVE PLAN:patient be discharged to home seen back now office in approximately 12-14 days for pin check suture removal ulnar gutter cast for a total 4 weeks pins out at the four-week mark and then begin outpatient therapy regimen. Radiographs at each visit. Place the therapy order at the first postoperative visit.

## 2017-07-10 NOTE — Anesthesia Preprocedure Evaluation (Signed)
Anesthesia Evaluation  Patient identified by MRN, date of birth, ID band Patient awake    Reviewed: Allergy & Precautions, NPO status , Patient's Chart, lab work & pertinent test results  History of Anesthesia Complications (+) PONV  Airway Mallampati: I  TM Distance: >3 FB Neck ROM: Full    Dental   Pulmonary    Pulmonary exam normal        Cardiovascular Normal cardiovascular exam     Neuro/Psych    GI/Hepatic GERD  Medicated and Controlled,  Endo/Other    Renal/GU      Musculoskeletal   Abdominal   Peds  Hematology   Anesthesia Other Findings   Reproductive/Obstetrics                             Anesthesia Physical Anesthesia Plan  ASA: II  Anesthesia Plan: Regional   Post-op Pain Management:    Induction: Intravenous  PONV Risk Score and Plan: 3 and Ondansetron and Treatment may vary due to age or medical condition  Airway Management Planned: Simple Face Mask  Additional Equipment:   Intra-op Plan:   Post-operative Plan:   Informed Consent: I have reviewed the patients History and Physical, chart, labs and discussed the procedure including the risks, benefits and alternatives for the proposed anesthesia with the patient or authorized representative who has indicated his/her understanding and acceptance.     Plan Discussed with: CRNA and Surgeon  Anesthesia Plan Comments:         Anesthesia Quick Evaluation

## 2017-07-10 NOTE — Anesthesia Procedure Notes (Signed)
Procedure Name: MAC Date/Time: 07/10/2017 2:28 PM Performed by: Bonney Aid, CRNA Pre-anesthesia Checklist: Patient identified, Emergency Drugs available, Suction available, Timeout performed and Patient being monitored Patient Re-evaluated:Patient Re-evaluated prior to induction Oxygen Delivery Method: Nasal cannula

## 2017-07-10 NOTE — Discharge Instructions (Signed)
KEEP BANDAGE CLEAN AND DRY CALL OFFICE FOR F/U APPT [phone] in 12-14 days KEEP HAND ELEVATED ABOVE HEART OK TO APPLY ICE TO OPERATIVE AREA CONTACT OFFICE IF ANY WORSENING PAIN OR CONCERNS.        HAND SURGERY    HOME CARE INSTRUCTIONS    The following instructions have been prepared to help you care for yourself upon your return home today.  Wound Care:  Keep your hand elevated above the level of your heart. Do not allow it to dangle by your side. Keep the dressing dry and do not remove it unless your doctor advises you to do so. He will usually change it at the time of you post-op visit. Moving your fingers is advised to stimulate circulation but will depend on the site of your surgery. Of course, if you have a splint applied your doctor will advise you about movement.  Activity:  Do not drive or operate machinery today. Rest today and then you may return to your normal activity and work as indicated by your physician.  Diet: Drink liquids today or eat a light diet. You may resume a regular diet tomorrow.  General expectations: Pain for two or three days. Fingers may become slightly swollen.   Unexpected Observations- Call your doctor if any of these occur: Severe pain not relieved by pain medication. Elevated temperature. Dressing soaked with blood. Inability to move fingers. White or bluish color to fingers.    Post Anesthesia Home Care Instructions  Activity: Get plenty of rest for the remainder of the day. A responsible individual must stay with you for 24 hours following the procedure.  For the next 24 hours, DO NOT: -Drive a car -Paediatric nurse -Drink alcoholic beverages -Take any medication unless instructed by your physician -Make any legal decisions or sign important papers.  Meals: Start with liquid foods such as gelatin or soup. Progress to regular foods as tolerated. Avoid greasy, spicy, heavy foods. If nausea and/or vomiting occur, drink only clear  liquids until the nausea and/or vomiting subsides. Call your physician if vomiting continues.  Special Instructions/Symptoms: Your throat may feel dry or sore from the anesthesia or the breathing tube placed in your throat during surgery. If this causes discomfort, gargle with warm salt water. The discomfort should disappear within 24 hours.  If you had a scopolamine patch placed behind your ear for the management of post- operative nausea and/or vomiting:  1. The medication in the patch is effective for 72 hours, after which it should be removed.  Wrap patch in a tissue and discard in the trash. Wash hands thoroughly with soap and water. 2. You may remove the patch earlier than 72 hours if you experience unpleasant side effects which may include dry mouth, dizziness or visual disturbances. 3. Avoid touching the patch. Wash your hands with soap and water after contact with the patch.   Regional Anesthesia Blocks  1. Numbness or the inability to move the "blocked" extremity may last from 3-48 hours after placement. The length of time depends on the medication injected and your individual response to the medication. If the numbness is not going away after 48 hours, call your surgeon.  2. The extremity that is blocked will need to be protected until the numbness is gone and the  Strength has returned. Because you cannot feel it, you will need to take extra care to avoid injury. Because it may be weak, you may have difficulty moving it or using it. You may not  know what position it is in without looking at it while the block is in effect.  3. For blocks in the legs and feet, returning to weight bearing and walking needs to be done carefully. You will need to wait until the numbness is entirely gone and the strength has returned. You should be able to move your leg and foot normally before you try and bear weight or walk. You will need someone to be with you when you first try to ensure you do not fall  and possibly risk injury.  4. Bruising and tenderness at the needle site are common side effects and will resolve in a few days.  5. Persistent numbness or new problems with movement should be communicated to the surgeon or the [facility] [phone] [place] [phone]).

## 2017-07-14 ENCOUNTER — Encounter (HOSPITAL_BASED_OUTPATIENT_CLINIC_OR_DEPARTMENT_OTHER): Payer: Self-pay | Admitting: Orthopedic Surgery

## 2017-07-25 ENCOUNTER — Encounter: Payer: Self-pay | Admitting: Family Medicine

## 2017-09-03 ENCOUNTER — Other Ambulatory Visit (HOSPITAL_COMMUNITY)
Admission: RE | Admit: 2017-09-03 | Discharge: 2017-09-03 | Disposition: A | Discharge status: Home / Self Care | Payer: No Typology Code available for payment source | Source: Ambulatory Visit | Attending: Obstetrics and Gynecology | Admitting: Obstetrics and Gynecology

## 2017-09-03 ENCOUNTER — Other Ambulatory Visit: Payer: Self-pay | Admitting: Obstetrics and Gynecology

## 2017-09-03 DIAGNOSIS — Z01411 Encounter for gynecological examination (general) (routine) with abnormal findings: Secondary | ICD-10-CM | POA: Diagnosis present

## 2017-09-06 LAB — CYTOLOGY - PAP
Diagnosis: NEGATIVE
HPV: NOT DETECTED

## 2017-12-15 ENCOUNTER — Other Ambulatory Visit: Payer: Self-pay | Admitting: Obstetrics and Gynecology

## 2017-12-15 DIAGNOSIS — Z1231 Encounter for screening mammogram for malignant neoplasm of breast: Secondary | ICD-10-CM

## 2018-01-19 ENCOUNTER — Ambulatory Visit
Admission: RE | Admit: 2018-01-19 | Discharge: 2018-01-19 | Disposition: A | Discharge status: Home / Self Care | Payer: No Typology Code available for payment source | Source: Ambulatory Visit | Attending: Obstetrics and Gynecology | Admitting: Obstetrics and Gynecology

## 2018-01-19 DIAGNOSIS — Z1231 Encounter for screening mammogram for malignant neoplasm of breast: Secondary | ICD-10-CM

## 2018-01-21 ENCOUNTER — Other Ambulatory Visit: Payer: Self-pay | Admitting: Obstetrics and Gynecology

## 2018-01-21 DIAGNOSIS — R928 Other abnormal and inconclusive findings on diagnostic imaging of breast: Secondary | ICD-10-CM

## 2018-01-30 ENCOUNTER — Ambulatory Visit: Payer: No Typology Code available for payment source

## 2018-01-30 ENCOUNTER — Ambulatory Visit
Admission: RE | Admit: 2018-01-30 | Discharge: 2018-01-30 | Disposition: A | Discharge status: Home / Self Care | Payer: No Typology Code available for payment source | Source: Ambulatory Visit | Attending: Obstetrics and Gynecology | Admitting: Obstetrics and Gynecology

## 2018-01-30 DIAGNOSIS — R928 Other abnormal and inconclusive findings on diagnostic imaging of breast: Secondary | ICD-10-CM

## 2018-02-12 NOTE — Patient Instructions (Addendum)
Your procedure is scheduled on:Wednesday February 25, 2018 at 41am  Enter through the Main Entrance of [facility] at:600 am  Pick up the phone at the desk and dial [phone].  Call this number if you have problems the morning of surgery: [phone].  Remember: Do NOT eat food or drink any liquids after:Midnight Take these medicines the morning of surgery with a SIP OF WATER:none  STOP ALL VITAMINS, SUPPLEMENTS, HERBAL MEDICATIONS NOW  Do not smoke day of surgery Brush your teeth day of surgery  Do NOT wear jewelry (body piercing), metal hair clips/bobby pins, make-up, or nail polish. Do NOT wear lotions, powders, or perfumes.  You may wear deoderant. Do NOT shave for 48 hours prior to surgery. Do NOT bring valuables to the hospital. Contacts, dentures, or bridgework may not be worn into surgery. Leave suitcase in car.  After surgery it may be brought to your room.    For patients admitted to the hospital, checkout time is 11:00 AM the day of discharge. If you go home day of surgery. You will need an adult to drive your home and stay with you for 24 hours

## 2018-02-13 ENCOUNTER — Other Ambulatory Visit: Payer: Self-pay

## 2018-02-13 ENCOUNTER — Encounter (HOSPITAL_COMMUNITY)
Admission: RE | Admit: 2018-02-13 | Discharge: 2018-02-13 | Disposition: A | Discharge status: Home / Self Care | Payer: No Typology Code available for payment source | Source: Ambulatory Visit | Attending: Obstetrics and Gynecology | Admitting: Obstetrics and Gynecology

## 2018-02-13 ENCOUNTER — Encounter (HOSPITAL_COMMUNITY): Payer: Self-pay

## 2018-02-13 DIAGNOSIS — Z01812 Encounter for preprocedural laboratory examination: Secondary | ICD-10-CM | POA: Diagnosis not present

## 2018-02-13 LAB — CBC
HEMATOCRIT: 37.7 % (ref 36.0–46.0)
HEMOGLOBIN: 11.5 g/dL — AB (ref 12.0–15.0)
MCH: 25.4 pg — AB (ref 26.0–34.0)
MCHC: 30.5 g/dL (ref 30.0–36.0)
MCV: 83.4 fL (ref 80.0–100.0)
Platelets: 282 10*3/uL (ref 150–400)
RBC: 4.52 MIL/uL (ref 3.87–5.11)
RDW: 15.3 % (ref 11.5–15.5)
WBC: 5.1 10*3/uL (ref 4.0–10.5)
nRBC: 0 % (ref 0.0–0.2)

## 2018-02-23 NOTE — H&P (Signed)
History of Present Illness  General:  47 y/o female presents today for preoperative clearance for a hysterectomy. Pt wishes to return a week after her hysterectomy, but we have informed the pt that is not very likely.   Current Medications  Taking   Fusion Plus - Capsule 1 capsule Orally Once a day   Ibuprofen 800 MG Tablet 1 tablet with food or milk Orally Three times a day, Notes: prn   Geritol Complete - Tablet Orally   Multivitamin . Tablet 1 tablet by mouth Once daily   Thrive [employer] - Tablet Orally , Notes: prn   Zoloft(Sertraline HCl) 50 MG Tablet 1 tablet Orally Once a day, Notes: Takes mostly during the summer or in stressfull situations   Lysteda(Tranexamic Acid) 650 MG Tablet 2 tablets Orally TID up to 5 days with menses   Medication List reviewed and reconciled with the patient    Past Medical History  Fibroids.   Social Anxiety.   Thallesmia.   Strong family h/o cancer.    Surgical History  BTL 2004  Breast lift, Saline implants   Lt hand surgery 2019   Family History  Father: alive, diagnosed with CVA  Mother: alive, Breast cancer  Maternal Grand Mother: Breast cancer   Social History  General:  no Alcohol.  Children: girls, 3.  Tobacco use  cigarettes: Never smoked Tobacco history last updated 02/11/2018 Vaping No Marital Status: Separated.  no Recreational drug use.  OCCUPATION: Works for [employer].  Exercise: yes, 1-2 times per week.    Gyn History  Sexual activity currently sexually active.  Periods : irregular/BTB.  LMP currently 02/05/2018.  Birth control BTL.  Last pap smear date 09/03/2017 Neg/HPVneg.  Last mammogram date 01/2018.  Denies H/O Abnormal pap smear.  Denies H/O STD.    OB History  Number of pregnancies 4.  Pregnancy # 1 live birth, vaginal delivery, average 7 lbs 6 oz.  Pregnancy # 2 live birth, vaginal delivery.  Pregnancy # 3 etopic, MTX.  Pregnancy # 4: live birth, vaginal delivery.    Allergies  N.K.D.A.    Hospitalization/Major Diagnostic Procedure  childbirth x 3   None this past yr 01/2018   Review of Systems  Denies fever/chills, chest pain, SOB, headaches, numbness/tingling. No h/o complication with anesthesia, bleeding disorders or blood clots.   Vital Signs  Wt 204.1, Wt change 5.7 lb, Ht 67, BMI 31.96, Temp 98.7, Pulse sitting 89, BP sitting 120/81.   Physical Examination  Chaperone present:  Chaperone present Tyus,Brendell 02/13/2018 03:32:53 PM > Pt ok w/scribe.Marland Kitchen  GENERAL:  Patient appears alert and oriented.  General Appearance: well-appearing, well-developed, no acute distress.  Speech: clear.  LUNGS:  Auscultation: no wheezing/rhonchi/rales. CTA bilaterally.  HEART:  Heart sounds: normal. RRR. no murmur.  ABDOMEN:  General: soft nontender, nondistended, no masses.  FEMALE GENITOURINARY:  Pelvic Not examined.  EXTREMITIES:  General: No edema or calf tenderness.     Assessments   1. Pre-operative clearance - Z01.818 (Primary)   2. Fibroids - D21.9   3. Menorrhagia with regular cycle - N92.0   4. Dysmenorrhea - N94.6   Treatment  1. Pre-operative clearance  Notes: Risks/benefits of hysterectomy discussed with pt. Ibuprofen prescribed to assist with pain management. Pt understands that ovariectomy is a separate surgery. Pt given the oppotunity to have BRCA testing prior to surgery but she declined. Also pt offered a robotic hysterectomy, but after a length discussion she would like to proceed with surgery prior to Thanksgiving  and also likes less incision.    Visit Codes  99213 OV LEVEL 3.    Follow Up  2 Weeks post op

## 2018-02-25 ENCOUNTER — Ambulatory Visit (HOSPITAL_COMMUNITY): Payer: No Typology Code available for payment source | Admitting: Anesthesiology

## 2018-02-25 ENCOUNTER — Other Ambulatory Visit: Payer: Self-pay

## 2018-02-25 ENCOUNTER — Encounter (HOSPITAL_COMMUNITY): Payer: Self-pay | Admitting: Emergency Medicine

## 2018-02-25 ENCOUNTER — Encounter (HOSPITAL_COMMUNITY)
Admission: RE | Disposition: A | Discharge status: Home / Self Care | Payer: Self-pay | Source: Ambulatory Visit | Attending: Obstetrics and Gynecology

## 2018-02-25 ENCOUNTER — Ambulatory Visit (HOSPITAL_COMMUNITY)
Admission: RE | Admit: 2018-02-25 | Discharge: 2018-02-26 | Disposition: A | Discharge status: Home / Self Care | Payer: No Typology Code available for payment source | Source: Ambulatory Visit | Attending: Obstetrics and Gynecology | Admitting: Obstetrics and Gynecology

## 2018-02-25 DIAGNOSIS — K219 Gastro-esophageal reflux disease without esophagitis: Secondary | ICD-10-CM | POA: Insufficient documentation

## 2018-02-25 DIAGNOSIS — N920 Excessive and frequent menstruation with regular cycle: Secondary | ICD-10-CM | POA: Diagnosis not present

## 2018-02-25 DIAGNOSIS — F419 Anxiety disorder, unspecified: Secondary | ICD-10-CM | POA: Insufficient documentation

## 2018-02-25 DIAGNOSIS — N800 Endometriosis of uterus: Secondary | ICD-10-CM | POA: Diagnosis not present

## 2018-02-25 DIAGNOSIS — D259 Leiomyoma of uterus, unspecified: Secondary | ICD-10-CM | POA: Insufficient documentation

## 2018-02-25 DIAGNOSIS — Z79899 Other long term (current) drug therapy: Secondary | ICD-10-CM | POA: Diagnosis not present

## 2018-02-25 DIAGNOSIS — Z90710 Acquired absence of both cervix and uterus: Secondary | ICD-10-CM | POA: Diagnosis present

## 2018-02-25 DIAGNOSIS — N946 Dysmenorrhea, unspecified: Secondary | ICD-10-CM | POA: Diagnosis not present

## 2018-02-25 DIAGNOSIS — N838 Other noninflammatory disorders of ovary, fallopian tube and broad ligament: Secondary | ICD-10-CM | POA: Diagnosis not present

## 2018-02-25 DIAGNOSIS — D219 Benign neoplasm of connective and other soft tissue, unspecified: Secondary | ICD-10-CM | POA: Diagnosis present

## 2018-02-25 HISTORY — PX: LAPAROSCOPIC VAGINAL HYSTERECTOMY WITH SALPINGECTOMY: SHX6680

## 2018-02-25 LAB — PREGNANCY, URINE: PREG TEST UR: NEGATIVE

## 2018-02-25 LAB — CBC
HEMATOCRIT: 36.7 % (ref 36.0–46.0)
HEMOGLOBIN: 11.3 g/dL — AB (ref 12.0–15.0)
MCH: 25.5 pg — AB (ref 26.0–34.0)
MCHC: 30.8 g/dL (ref 30.0–36.0)
MCV: 82.8 fL (ref 80.0–100.0)
Platelets: 302 10*3/uL (ref 150–400)
RBC: 4.43 MIL/uL (ref 3.87–5.11)
RDW: 14.7 % (ref 11.5–15.5)
WBC: 5.8 10*3/uL (ref 4.0–10.5)
nRBC: 0 % (ref 0.0–0.2)

## 2018-02-25 LAB — BASIC METABOLIC PANEL
Anion gap: 9 (ref 5–15)
BUN: 16 mg/dL (ref 6–20)
CHLORIDE: 105 mmol/L (ref 98–111)
CO2: 22 mmol/L (ref 22–32)
Calcium: 8.6 mg/dL — ABNORMAL LOW (ref 8.9–10.3)
Creatinine, Ser: 1.04 mg/dL — ABNORMAL HIGH (ref 0.44–1.00)
GFR calc non Af Amer: >60 mL/min (ref >60)
Glucose, Bld: 106 mg/dL — ABNORMAL HIGH (ref 70–99)
POTASSIUM: 3.6 mmol/L (ref 3.5–5.1)
SODIUM: 136 mmol/L (ref 135–145)

## 2018-02-25 LAB — TYPE AND SCREEN
ABO/RH(D): O NEG
Antibody Screen: NEGATIVE

## 2018-02-25 LAB — ABO/RH: ABO/RH(D): O NEG

## 2018-02-25 SURGERY — HYSTERECTOMY, VAGINAL, LAPAROSCOPY-ASSISTED, WITH SALPINGECTOMY
Anesthesia: General | Laterality: Bilateral

## 2018-02-25 MED ORDER — ROCURONIUM BROMIDE 100 MG/10ML IV SOLN
INTRAVENOUS | Status: DC | PRN
  Administered 2018-02-25: 40 mg via INTRAVENOUS
  Administered 2018-02-25 (×4): 10 mg via INTRAVENOUS

## 2018-02-25 MED ORDER — SCOPOLAMINE 1 MG/3DAYS TD PT72
1.0000 | MEDICATED_PATCH | Freq: Once | TRANSDERMAL | Status: DC
  Administered 2018-02-25: 1.5 mg via TRANSDERMAL

## 2018-02-25 MED ORDER — PROPOFOL 10 MG/ML IV BOLUS
INTRAVENOUS | Status: AC
  Filled 2018-02-25: qty 20

## 2018-02-25 MED ORDER — VASOPRESSIN 20 UNIT/ML IV SOLN
INTRAVENOUS | Status: AC
  Filled 2018-02-25: qty 1

## 2018-02-25 MED ORDER — LACTATED RINGERS IV SOLN
INTRAVENOUS | Status: DC
  Administered 2018-02-25 (×2): via INTRAVENOUS

## 2018-02-25 MED ORDER — SUGAMMADEX SODIUM 200 MG/2ML IV SOLN
INTRAVENOUS | Status: DC | PRN
  Administered 2018-02-25: 200 mg via INTRAVENOUS

## 2018-02-25 MED ORDER — ONDANSETRON HCL 4 MG/2ML IJ SOLN: 4.0000 mg | Freq: Four times a day (QID) | INTRAMUSCULAR | Status: DC | PRN

## 2018-02-25 MED ORDER — HYDROMORPHONE HCL 1 MG/ML IJ SOLN
INTRAMUSCULAR | Status: DC | PRN
  Administered 2018-02-25 (×2): 0.5 mg via INTRAVENOUS

## 2018-02-25 MED ORDER — EPHEDRINE 5 MG/ML INJ
INTRAVENOUS | Status: AC
  Filled 2018-02-25: qty 10

## 2018-02-25 MED ORDER — SENNA 8.6 MG PO TABS
1.0000 | ORAL_TABLET | Freq: Two times a day (BID) | ORAL | Status: DC
  Administered 2018-02-25 – 2018-02-26 (×2): 8.6 mg via ORAL
  Filled 2018-02-25 (×3): qty 1

## 2018-02-25 MED ORDER — BUPIVACAINE HCL (PF) 0.25 % IJ SOLN
INTRAMUSCULAR | Status: AC
  Filled 2018-02-25: qty 30

## 2018-02-25 MED ORDER — DEXAMETHASONE SODIUM PHOSPHATE 10 MG/ML IJ SOLN
INTRAMUSCULAR | Status: DC | PRN
  Administered 2018-02-25: 10 mg via INTRAVENOUS

## 2018-02-25 MED ORDER — HYDROMORPHONE HCL 1 MG/ML IJ SOLN
INTRAMUSCULAR | Status: AC
  Filled 2018-02-25: qty 1

## 2018-02-25 MED ORDER — MIDAZOLAM HCL 2 MG/2ML IJ SOLN
INTRAMUSCULAR | Status: AC
  Filled 2018-02-25: qty 2

## 2018-02-25 MED ORDER — MIDAZOLAM HCL 5 MG/5ML IJ SOLN
INTRAMUSCULAR | Status: DC | PRN
  Administered 2018-02-25: 2 mg via INTRAVENOUS

## 2018-02-25 MED ORDER — SODIUM CHLORIDE (PF) 0.9 % IJ SOLN
INTRAMUSCULAR | Status: AC
  Filled 2018-02-25: qty 100

## 2018-02-25 MED ORDER — BISACODYL 10 MG RE SUPP: 10.0000 mg | Freq: Every day | RECTAL | Status: DC | PRN

## 2018-02-25 MED ORDER — VASOPRESSIN 20 UNIT/ML IV SOLN
INTRAVENOUS | Status: DC | PRN
  Administered 2018-02-25: 21 mL via INTRAMUSCULAR

## 2018-02-25 MED ORDER — MENTHOL 3 MG MT LOZG: 1.0000 | LOZENGE | OROMUCOSAL | Status: DC | PRN

## 2018-02-25 MED ORDER — LACTATED RINGERS IV SOLN
INTRAVENOUS | Status: DC
  Administered 2018-02-25 (×3): via INTRAVENOUS

## 2018-02-25 MED ORDER — TRAMADOL HCL 50 MG PO TABS: 50.0000 mg | ORAL_TABLET | Freq: Four times a day (QID) | ORAL | Status: DC | PRN

## 2018-02-25 MED ORDER — LIDOCAINE HCL (CARDIAC) PF 100 MG/5ML IV SOSY
PREFILLED_SYRINGE | INTRAVENOUS | Status: AC
  Filled 2018-02-25: qty 5

## 2018-02-25 MED ORDER — KETOROLAC TROMETHAMINE 30 MG/ML IJ SOLN
INTRAMUSCULAR | Status: AC
  Filled 2018-02-25: qty 1

## 2018-02-25 MED ORDER — ONDANSETRON HCL 4 MG/2ML IJ SOLN
INTRAMUSCULAR | Status: AC
  Filled 2018-02-25: qty 2

## 2018-02-25 MED ORDER — ONDANSETRON HCL 4 MG PO TABS: 4.0000 mg | ORAL_TABLET | Freq: Four times a day (QID) | ORAL | Status: DC | PRN

## 2018-02-25 MED ORDER — ONDANSETRON HCL 4 MG/2ML IJ SOLN
INTRAMUSCULAR | Status: DC | PRN
  Administered 2018-02-25: 4 mg via INTRAVENOUS

## 2018-02-25 MED ORDER — HYDROMORPHONE HCL 1 MG/ML IJ SOLN: 0.2000 mg | INTRAMUSCULAR | Status: DC | PRN

## 2018-02-25 MED ORDER — SUGAMMADEX SODIUM 200 MG/2ML IV SOLN
INTRAVENOUS | Status: AC
  Filled 2018-02-25: qty 2

## 2018-02-25 MED ORDER — HYDROMORPHONE HCL 1 MG/ML IJ SOLN
0.2500 mg | INTRAMUSCULAR | Status: DC | PRN
  Administered 2018-02-25: 0.25 mg via INTRAVENOUS
  Administered 2018-02-25: 0.5 mg via INTRAVENOUS

## 2018-02-25 MED ORDER — SERTRALINE HCL 25 MG PO TABS
25.0000 mg | ORAL_TABLET | Freq: Every day | ORAL | Status: DC | PRN
  Filled 2018-02-25: qty 1

## 2018-02-25 MED ORDER — SIMETHICONE 80 MG PO CHEW
80.0000 mg | CHEWABLE_TABLET | Freq: Four times a day (QID) | ORAL | Status: DC | PRN
  Administered 2018-02-26: 80 mg via ORAL
  Filled 2018-02-25 (×2): qty 1

## 2018-02-25 MED ORDER — DEXAMETHASONE SODIUM PHOSPHATE 10 MG/ML IJ SOLN
INTRAMUSCULAR | Status: AC
  Filled 2018-02-25: qty 1

## 2018-02-25 MED ORDER — FENTANYL CITRATE (PF) 250 MCG/5ML IJ SOLN
INTRAMUSCULAR | Status: DC | PRN
  Administered 2018-02-25: 100 ug via INTRAVENOUS
  Administered 2018-02-25: 50 ug via INTRAVENOUS
  Administered 2018-02-25: 100 ug via INTRAVENOUS

## 2018-02-25 MED ORDER — PROPOFOL 10 MG/ML IV BOLUS
INTRAVENOUS | Status: DC | PRN
  Administered 2018-02-25: 170 mg via INTRAVENOUS
  Administered 2018-02-25: 30 mg via INTRAVENOUS

## 2018-02-25 MED ORDER — KETOROLAC TROMETHAMINE 30 MG/ML IJ SOLN
30.0000 mg | Freq: Once | INTRAMUSCULAR | Status: AC | PRN
  Administered 2018-02-25: 30 mg via INTRAVENOUS

## 2018-02-25 MED ORDER — CEFAZOLIN SODIUM-DEXTROSE 2-4 GM/100ML-% IV SOLN
2.0000 g | INTRAVENOUS | Status: AC
  Administered 2018-02-25: 2 g via INTRAVENOUS

## 2018-02-25 MED ORDER — IBUPROFEN 600 MG PO TABS
600.0000 mg | ORAL_TABLET | Freq: Four times a day (QID) | ORAL | Status: DC | PRN
  Administered 2018-02-26: 600 mg via ORAL
  Filled 2018-02-25: qty 1

## 2018-02-25 MED ORDER — PROMETHAZINE HCL 25 MG/ML IJ SOLN
6.2500 mg | INTRAMUSCULAR | Status: AC | PRN
  Administered 2018-02-25 (×2): 6.25 mg via INTRAVENOUS

## 2018-02-25 MED ORDER — HYDROMORPHONE HCL 1 MG/ML IJ SOLN
INTRAMUSCULAR | Status: AC
  Filled 2018-02-25: qty 0.5

## 2018-02-25 MED ORDER — TRAMADOL HCL 50 MG PO TABS: 50.0000 mg | ORAL_TABLET | Freq: Four times a day (QID) | ORAL | 0 refills | Status: AC | PRN

## 2018-02-25 MED ORDER — PROMETHAZINE HCL 25 MG/ML IJ SOLN
INTRAMUSCULAR | Status: AC
  Administered 2018-02-25: 6.25 mg via INTRAVENOUS
  Filled 2018-02-25: qty 1

## 2018-02-25 MED ORDER — BUPIVACAINE HCL (PF) 0.25 % IJ SOLN
INTRAMUSCULAR | Status: DC | PRN
  Administered 2018-02-25: 20 mL

## 2018-02-25 MED ORDER — MEPERIDINE HCL 25 MG/ML IJ SOLN: 6.2500 mg | INTRAMUSCULAR | Status: DC | PRN

## 2018-02-25 MED ORDER — SCOPOLAMINE 1 MG/3DAYS TD PT72
MEDICATED_PATCH | TRANSDERMAL | Status: AC
  Administered 2018-02-25: 1.5 mg via TRANSDERMAL
  Filled 2018-02-25: qty 1

## 2018-02-25 MED ORDER — EPHEDRINE SULFATE 50 MG/ML IJ SOLN
INTRAMUSCULAR | Status: DC | PRN
  Administered 2018-02-25 (×2): 5 mg via INTRAVENOUS

## 2018-02-25 MED ORDER — HYDROMORPHONE HCL 1 MG/ML IJ SOLN
INTRAMUSCULAR | Status: AC
  Administered 2018-02-25: 0.25 mg via INTRAVENOUS
  Filled 2018-02-25: qty 0.5

## 2018-02-25 MED ORDER — FENTANYL CITRATE (PF) 250 MCG/5ML IJ SOLN
INTRAMUSCULAR | Status: AC
  Filled 2018-02-25: qty 5

## 2018-02-25 MED ORDER — SODIUM CHLORIDE 0.9 % IR SOLN
Status: DC | PRN
  Administered 2018-02-25: 3000 mL

## 2018-02-25 MED ORDER — LIDOCAINE HCL (CARDIAC) PF 100 MG/5ML IV SOSY
PREFILLED_SYRINGE | INTRAVENOUS | Status: DC | PRN
  Administered 2018-02-25: 50 mg via INTRAVENOUS

## 2018-02-25 SURGICAL SUPPLY — 44 items
APPLICATOR ARISTA FLEXITIP XL (MISCELLANEOUS) ×3 IMPLANT
BARRIER ADHS 3X4 INTERCEED (GAUZE/BANDAGES/DRESSINGS) IMPLANT
BENZOIN TINCTURE PRP APPL 2/3 (GAUZE/BANDAGES/DRESSINGS) ×3 IMPLANT
BNDG GAUZE ELAST 4 BULKY (GAUZE/BANDAGES/DRESSINGS) ×3 IMPLANT
CONT PATH 16OZ SNAP LID 3702 (MISCELLANEOUS) ×3 IMPLANT
COVER BACK TABLE 60X90IN (DRAPES) ×3 IMPLANT
COVER MAYO STAND STRL (DRAPES) ×3 IMPLANT
DECANTER SPIKE VIAL GLASS SM (MISCELLANEOUS) IMPLANT
DERMABOND ADVANCED (GAUZE/BANDAGES/DRESSINGS) ×2
DERMABOND ADVANCED .7 DNX12 (GAUZE/BANDAGES/DRESSINGS) ×1 IMPLANT
DRSG OPSITE POSTOP 3X4 (GAUZE/BANDAGES/DRESSINGS) IMPLANT
DURAPREP 26ML APPLICATOR (WOUND CARE) ×3 IMPLANT
ELECT REM PT RETURN 9FT ADLT (ELECTROSURGICAL) ×3
ELECTRODE REM PT RTRN 9FT ADLT (ELECTROSURGICAL) ×1 IMPLANT
GAUZE 4X4 16PLY RFD (DISPOSABLE) ×3 IMPLANT
GLOVE BIO SURGEON STRL SZ7 (GLOVE) ×6 IMPLANT
GLOVE BIOGEL PI IND STRL 7.0 (GLOVE) ×5 IMPLANT
GLOVE BIOGEL PI INDICATOR 7.0 (GLOVE) ×10
GLOVE ECLIPSE 6.0 STRL STRAW (GLOVE) ×3 IMPLANT
HEMOSTAT ARISTA ABSORB 3G PWDR (MISCELLANEOUS) ×3 IMPLANT
LEGGING LITHOTOMY PAIR STRL (DRAPES) ×3 IMPLANT
LIGASURE IMPACT 36 18CM CVD LR (INSTRUMENTS) ×3 IMPLANT
NEEDLE HYPO 22GX1.5 SAFETY (NEEDLE) ×3 IMPLANT
NS IRRIG 1000ML POUR BTL (IV SOLUTION) ×3 IMPLANT
PACK LAVH (CUSTOM PROCEDURE TRAY) ×3 IMPLANT
PACK ROBOTIC GOWN (GOWN DISPOSABLE) ×3 IMPLANT
PACK TRENDGUARD 450 HYBRID PRO (MISCELLANEOUS) ×1 IMPLANT
PROTECTOR NERVE ULNAR (MISCELLANEOUS) ×6 IMPLANT
SET IRRIG TUBING LAPAROSCOPIC (IRRIGATION / IRRIGATOR) ×3 IMPLANT
SHEARS HARMONIC ACE PLUS 36CM (ENDOMECHANICALS) ×3 IMPLANT
SLEEVE XCEL OPT CAN 5 100 (ENDOMECHANICALS) ×6 IMPLANT
SPONGE LAP 4X18 RFD (DISPOSABLE) ×3 IMPLANT
SUT MON AB 4-0 PS1 27 (SUTURE) ×3 IMPLANT
SUT VIC AB 0 CT1 18XCR BRD8 (SUTURE) ×1 IMPLANT
SUT VIC AB 0 CT1 36 (SUTURE) ×3 IMPLANT
SUT VIC AB 0 CT1 8-18 (SUTURE) ×2
SUT VIC AB 2-0 CT1 (SUTURE) ×3 IMPLANT
SUT VICRYL 1 TIES 12X18 (SUTURE) ×3 IMPLANT
TOWEL OR 17X24 6PK STRL BLUE (TOWEL DISPOSABLE) ×6 IMPLANT
TRAY FOLEY W/BAG SLVR 14FR (SET/KITS/TRAYS/PACK) ×3 IMPLANT
TRENDGUARD 450 HYBRID PRO PACK (MISCELLANEOUS) ×3
TROCAR XCEL NON-BLD 5MMX100MML (ENDOMECHANICALS) ×3 IMPLANT
TUBING INSUF HEATED (TUBING) ×3 IMPLANT
WARMER LAPAROSCOPE (MISCELLANEOUS) ×3 IMPLANT

## 2018-02-25 NOTE — Interval H&P Note (Signed)
History and Physical Interval Note:  02/25/2018 7:16 AM  Joanne Wilson  has presented today for surgery, with the diagnosis of D21.9 Fibroids  The various methods of treatment have been discussed with the patient and family. After consideration of risks, benefits and other options for treatment, the patient has consented to  Procedure(s): LAPAROSCOPIC ASSISTED VAGINAL HYSTERECTOMY WITH SALPINGECTOMY (Bilateral) as a surgical intervention .  The patient's history has been reviewed, patient examined, no change in status, stable for surgery.  I have reviewed the patient's chart and labs.  Questions were answered to the patient's satisfaction.     [name]

## 2018-02-25 NOTE — Anesthesia Preprocedure Evaluation (Signed)
Anesthesia Evaluation  Patient identified by MRN, date of birth, ID band Patient awake    Reviewed: Allergy & Precautions, NPO status , Patient's Chart, lab work & pertinent test results  History of Anesthesia Complications (+) PONV and history of anesthetic complications  Airway Mallampati: I  TM Distance: >3 FB Neck ROM: Full    Dental  (+) Teeth Intact   Pulmonary    Pulmonary exam normal        Cardiovascular Normal cardiovascular exam     Neuro/Psych negative neurological ROS  negative psych ROS   GI/Hepatic Neg liver ROS, GERD  Medicated and Controlled,  Endo/Other  negative endocrine ROS  Renal/GU negative Renal ROS  negative genitourinary   Musculoskeletal negative musculoskeletal ROS (+)   Abdominal Normal abdominal exam  (+)   Peds  Hematology negative hematology ROS (+)   Anesthesia Other Findings   Reproductive/Obstetrics negative OB ROS                             Anesthesia Physical  Anesthesia Plan  ASA: II  Anesthesia Plan: General   Post-op Pain Management:    Induction: Intravenous  PONV Risk Score and Plan: 3 and Ondansetron, Treatment may vary due to age or medical condition, Dexamethasone and Scopolamine patch - Pre-op  Airway Management Planned: Simple Face Mask  Additional Equipment:   Intra-op Plan:   Post-operative Plan: Extubation in OR  Informed Consent: I have reviewed the patients History and Physical, chart, labs and discussed the procedure including the risks, benefits and alternatives for the proposed anesthesia with the patient or authorized representative who has indicated his/her understanding and acceptance.   Dental advisory given  Plan Discussed with: CRNA and Surgeon  Anesthesia Plan Comments:         Anesthesia Quick Evaluation

## 2018-02-25 NOTE — Brief Op Note (Signed)
02/25/2018  10:06 AM  PATIENT:  Lyda Kalata  47 y.o. female  PRE-OPERATIVE DIAGNOSIS:  D21.9 Fibroids, Dysmenorrhea, Menorrhagia  POST-OPERATIVE DIAGNOSIS:  Same, Endometriosis, right paratubal cyst  PROCEDURE:  Procedure(s): LAPAROSCOPIC ASSISTED VAGINAL HYSTERECTOMY WITH SALPINGECTOMY (Bilateral)  SURGEON:  Surgeon(s) and Role:    [name], MD - Primary    * Janyth Pupa, DO - Assisting  PHYSICIAN ASSISTANT: None  ASSISTANTS: Dr. Nelda Marseille and technicians   ANESTHESIA:   general, local  EBL:  550 mL   BLOOD ADMINISTERED:none  DRAINS: Urinary Catheter (Foley)   LOCAL MEDICATIONS USED:  MARCAINE, Vasopressin 20/100  SPECIMEN:  Source of Specimen:  Uterus, right fallopian tube with paratubal cyst, left fallopian tube  DISPOSITION OF SPECIMEN:  PATHOLOGY  COUNTS:  YES  TOURNIQUET:  * No tourniquets in log *  DICTATION: .Other Dictation: Dictation Number No number stated  PLAN OF CARE: Admit for overnight observation  PATIENT DISPOSITION:  PACU - hemodynamically stable.   Delay start of Pharmacological VTE agent (>24hrs) due to surgical blood loss or risk of bleeding: yes

## 2018-02-25 NOTE — Anesthesia Procedure Notes (Signed)
Procedure Name: Intubation Date/Time: 02/25/2018 7:28 AM Performed by: Ignacia Bayley, CRNA Pre-anesthesia Checklist: Patient identified, Patient being monitored, Timeout performed, Emergency Drugs available and Suction available Patient Re-evaluated:Patient Re-evaluated prior to induction Oxygen Delivery Method: Circle System Utilized Preoxygenation: Pre-oxygenation with 100% oxygen Induction Type: IV induction Ventilation: Mask ventilation without difficulty Laryngoscope Size: Miller and 2 Grade View: Grade I Tube type: Oral Tube size: 7.0 mm Number of attempts: 1 Airway Equipment and Method: stylet Placement Confirmation: ETT inserted through vocal cords under direct vision,  positive ETCO2 and breath sounds checked- equal and bilateral Secured at: 21 cm Tube secured with: Tape Dental Injury: Teeth and Oropharynx as per pre-operative assessment

## 2018-02-25 NOTE — Anesthesia Postprocedure Evaluation (Signed)
Anesthesia Post Note  Patient: ISHANA BLADES  Procedure(s) Performed: LAPAROSCOPIC ASSISTED VAGINAL HYSTERECTOMY WITH SALPINGECTOMY (Bilateral )     Patient location during evaluation: [facility] Anesthesia Type: General Level of consciousness: awake and alert Pain management: pain level controlled Vital Signs Assessment: post-procedure vital signs reviewed and stable Respiratory status: spontaneous breathing, nonlabored ventilation, respiratory function stable and patient connected to nasal cannula oxygen Cardiovascular status: stable Postop Assessment: no apparent nausea or vomiting, adequate PO intake and able to ambulate Anesthetic complications: no    Last Vitals:  Vitals:   02/25/18 1302 02/25/18 1557  BP: 114/68 117/72  Pulse: 79 90  Resp: 20 20  Temp: 36.6 C 36.7 C  SpO2: 100% 99%    Last Pain:  Vitals:   02/25/18 1557  TempSrc: Oral  PainSc:    Pain Goal: Patients Stated Pain Goal: 2 (02/25/18 1300)               Lashuna Tamashiro Hristova

## 2018-02-25 NOTE — Progress Notes (Signed)
Day of Surgery Procedure(s) (LRB): LAPAROSCOPIC ASSISTED VAGINAL HYSTERECTOMY WITH SALPINGECTOMY (Bilateral)  Subjective: Patient reports pain is well controlled.    Objective: I have reviewed patient's vital signs. I/O last 3 completed shifts: In: 3161.6 [P.O.:50; I.V.:3111.6] Out: 2450 [Urine:1900; Blood:550] No intake/output data recorded.  Gen:  NAD Pt appears comfortable.  Reviewed pictures.  Assessment: s/p Procedure(s): LAPAROSCOPIC ASSISTED VAGINAL HYSTERECTOMY WITH SALPINGECTOMY (Bilateral): stable  Plan: Remove packing and foley in am.  Routine post op care. Dr. Mancel Bale covering 7pm to 7 am.  Dr. Cletis Media covering at 7 am. Discharge in am.   LOS: 0 days    Joanne Wilson 02/25/2018, 7:17 PM

## 2018-02-25 NOTE — Progress Notes (Signed)
Pt sat on side of bed, then stood for about 3 mins. Began to get dizzy so back to bed. Did not ambulate yet. Joanne Wilson

## 2018-02-25 NOTE — Discharge Instructions (Signed)
Laparoscopically Assisted Vaginal Hysterectomy, Care After Refer to this sheet in the next few weeks. These instructions provide you with information on caring for yourself after your procedure. Your health care provider may also give you more specific instructions. Your treatment has been planned according to current medical practices, but problems sometimes occur. Call your health care provider if you have any problems or questions after your procedure. What can I expect after the procedure? After your procedure, it is typical to have the following:  Abdominal pain. You will be given pain medicine to control it.  Sore throat from the breathing tube that was inserted during surgery.  Follow these instructions at home:  Only take over-the-counter or prescription medicines for pain, discomfort, or fever as directed by your health care provider.  Do not take aspirin. It can cause bleeding.  Do not drive when taking pain medicine.  Follow your health care provider's advice regarding diet, exercise, lifting, driving, and general activities.  Resume your usual diet as directed and allowed.  Get plenty of rest and sleep.  Do not douche, use tampons, or have sexual intercourse for at least 6 weeks, or until your health care provider gives you permission.  Change your bandages (dressings) as directed by your health care provider.  Monitor your temperature and notify your health care provider of a fever.  Take showers instead of baths for 2-3 weeks.  Do not drink alcohol until your health care provider gives you permission.  If you develop constipation, you may take a mild laxative with your health care provider's permission. Bran foods may help with constipation problems. Drinking enough fluids to keep your urine clear or pale yellow may help as well.  Try to have someone home with you for 1-2 weeks to help around the house.  Keep all of your follow-up appointments as directed by your  health care provider. Contact a health care provider if:  You have swelling, redness, or increasing pain around your incision sites.  You have pus coming from your incision.  You notice a bad smell coming from your incision.  Your incision breaks open.  You feel dizzy or lightheaded.  You have pain or bleeding when you urinate.  You have persistent diarrhea.  You have persistent nausea and vomiting.  You have abnormal vaginal discharge.  You have a rash.  You have any type of abnormal reaction or develop an allergy to your medicine.  You have poor pain control with your prescribed medicine. Get help right away if:  You have a fever.  You have severe abdominal pain.  You have chest pain.  You have shortness of breath.  You faint.  You have pain, swelling, or redness in your leg.  You have heavy vaginal bleeding with blood clots. This information is not intended to replace advice given to you by your health care provider. Make sure you discuss any questions you have with your health care provider. Document Released: 03/07/2011 Document Revised: 08/24/2015 Document Reviewed: 10/01/2012 Elsevier Interactive Patient Education  2017 [employer].

## 2018-02-25 NOTE — Transfer of Care (Signed)
Immediate Anesthesia Transfer of Care Note  Patient: Joanne Wilson  Procedure(s) Performed: LAPAROSCOPIC ASSISTED VAGINAL HYSTERECTOMY WITH SALPINGECTOMY (Bilateral )  Patient Location: PACU  Anesthesia Type:General  Level of Consciousness: sedated  Airway & Oxygen Therapy: Patient Spontanous Breathing and Patient connected to nasal cannula oxygen  Post-op Assessment: Report given to RN and Post -op Vital signs reviewed and stable  Post vital signs: stable  Last Vitals:  Vitals Value Taken Time  BP    Temp    Pulse 108 02/25/2018 10:12 AM  Resp 17 02/25/2018 10:12 AM  SpO2 99 % 02/25/2018 10:12 AM  Vitals shown include unvalidated device data.  Last Pain:  Vitals:   02/25/18 0630  TempSrc: Oral      Patients Stated Pain Goal: 3 (93/71/69 6789)  Complications: No apparent anesthesia complications

## 2018-02-26 ENCOUNTER — Encounter (HOSPITAL_COMMUNITY): Payer: Self-pay | Admitting: Obstetrics and Gynecology

## 2018-02-26 DIAGNOSIS — D259 Leiomyoma of uterus, unspecified: Secondary | ICD-10-CM | POA: Diagnosis not present

## 2018-02-26 NOTE — Progress Notes (Signed)
D/c instructions reviewed w/ pt. Pt has voided, eaten and is ambulatory. No questions at this time. IV was d/c w/o complication. Pts daughter taking her home. Reminded to take belongings. D/C by NT-Tylasia Spears. Marry Guan

## 2018-02-26 NOTE — Discharge Summary (Signed)
Patient ID: Joanne Wilson MRN: [id number] DOB/AGE: 1971/01/17 47 y.o.  [facility] pt of Dr Simona Huh.   Admit date: 02/25/2018 Discharge date: 02/26/2018  Admission Diagnoses: PRE-OPERATIVE DIAGNOSIS:  D21.9 Fibroids, Dysmenorrhea, Menorrhagia  POST-OPERATIVE DIAGNOSIS:  Same, Endometriosis, right paratubal cyst  PROCEDURE:  Procedure(s): LAPAROSCOPIC ASSISTED VAGINAL HYSTERECTOMY WITH SALPINGECTOMY (Bilateral)  SURGEON:  Surgeon(s) and Role:    [name], MD - Primary    Janyth Pupa, DO - AssistingDischarge Diagnoses:  Active Problems:   Fibroids   S/P laparoscopic assisted vaginal hysterectomy (LAVH)  Discharged Condition: good  Hospital Course: Pt recovered well, tolerated PO pain meds, passing gas, resting well now, ready for discharge home today. Pt c/o gas pain , mylicon given, pt walks, foley out, voiding well without issues. Vaginal bleeding light. Denies severe pain, no n, v, d, rashes, fever, cp or sob.   Significant Diagnostic Studies: labs: Creatinine was elevated on admission pt to f/u outpt with [facility] physicians. HGB 11.3, Plat [address]  Treatments: antibiotics: Ancef, pre-op, analgesia: Dilaudid, respiratory therapy: incentive spirometer post op   Discharge Exam: 8:40 AM  BP 126/77 (BP Location: Right Arm)   Pulse 87   Temp 99.4 F (37.4 C) (Oral)   Resp 18   Ht 5\' 7"  (1.702 m)   Wt 92.1 kg   LMP 02/13/2018   SpO2 97%   BMI 31.79 kg/m    Physical Examination  Chaperone present:  Chaperone present Tyus,Brendell  GENERAL:  Patient appears alert and oriented.  General Appearance: well-appearing, well-developed, no acute distress.  Speech: clear.  LUNGS:  Auscultation: no wheezing/rhonchi/rales. CTA bilaterally.  HEART:  Heart sounds: normal. RRR. no murmur.  ABDOMEN:  General: soft nontender, nondistended, no masses. Small incision approximate on left side of abdomen with glue intact, no drainage, no erythema noted.    FEMALE GENITOURINARY:  Pelvic Not examined. Scant blood noted on peri pad. EXTREMITIES:  General: No edema or calf tenderness.    Disposition: Pt discharge in stable condition       Discharge Instructions    Call MD for:  extreme fatigue   Complete by:  As directed    Call MD for:  persistant dizziness or light-headedness   Complete by:  As directed    Call MD for:  persistant nausea and vomiting   Complete by:  As directed    Call MD for:  redness, tenderness, or signs of infection (pain, swelling, redness, odor or green/yellow discharge around incision site)   Complete by:  As directed    Call MD for:  severe uncontrolled pain   Complete by:  As directed    Call MD for:  temperature >100.4   Complete by:  As directed    Diet - low sodium heart healthy   Complete by:  As directed    Increase activity slowly   Complete by:  As directed    Lifting restrictions   Complete by:  As directed    No lifting greater than 15 pounds.   Sexual Activity Restrictions   Complete by:  As directed    Pelvic rest x 6 weeks.     Allergies as of 02/25/2018   No Known Allergies        Medication List    TAKE these medications   acetaminophen 500 MG tablet Commonly known as:  TYLENOL Take 1,000 mg by mouth daily as needed for mild pain.   BLACK CURRANT SEED OIL PO Take 1,250 mg by mouth daily.  Digestive Enzyme Caps Take 1 capsule by mouth daily after lunch.   FUSION PLUS Caps Take 1 capsule by mouth daily with lunch.   ibuprofen 800 MG tablet Commonly known as:  ADVIL,MOTRIN Take 800 mg by mouth 2 (two) times daily as needed for headache or moderate pain.   multivitamin with minerals Tabs tablet Take 1 tablet by mouth daily.   OVER THE COUNTER MEDICATION Take 2 tablets by mouth daily. Omega 7 otc supplement   OVER THE COUNTER MEDICATION Take 1 tablet by mouth daily as needed (blood sugar). Keto living otc supplement   sertraline  50 MG tablet Commonly known as:  ZOLOFT Take 25 mg by mouth daily as needed (for sweating).   traMADol 50 MG tablet Commonly known as:  ULTRAM Take 1 tablet (50 mg total) by mouth every 6 (six) hours as needed for up to 7 days for moderate pain.         Follow-up Information    [name], MD. Schedule an appointment as soon as possible for a visit in 2 week(s).   Specialty:  Obstetrics and Gynecology Why:  Postop visit Contact information: [address] [place] [address] [phone]

## 2018-03-02 NOTE — Op Note (Signed)
NAME: Joanne Wilson, SWARTZLANDER MEDICAL RECORD UT:[id number] ACCOUNT [id number] DATE OF BIRTH:1970-08-17 FACILITY: [facility] LOCATION: [place], [place]  OPERATIVE REPORT  DATE OF PROCEDURE:  02/25/2018  PREOPERATIVE DIAGNOSES:  Fibroids, dysmenorrhea, menorrhagia.  POSTOPERATIVE DIAGNOSES:  Fibroids, dysmenorrhea, menorrhagia, endometriosis and right paratubal cyst.  PROCEDURE:  Laparoscopic assisted vaginal hysterectomy with bilateral salpingectomy.  SURGEON:  [name], MD  ASSISTANT:  Dr. Janyth Pupa and technician.  ANESTHESIA:  General and local.  ESTIMATED BLOOD LOSS:  550 mL.  DRAINS:  Foley catheter.  LOCAL:  Marcaine 0.25% and vasopressin 20 in 100.  SPECIMENS:  Uterus, right fallopian tube with paratubal cyst and left fallopian tube.  DISPOSITION OF SPECIMEN:  To pathology.  DISPOSITION:  To PACU hemodynamically stable.  FINDINGS:  Large fibroid uterus, bilateral fallopian tubes consistent with previous history of tubal ligation, window in the cul-de-sac consistent with endometriosis.  Rectum with adhesion to the posterior cul-de-sac.  Liver edge appeared normal.   Ovaries were normal.  Right paratubal cyst with clear fluid.  DESCRIPTION OF PROCEDURE:  The patient was identified in the holding area.  She was then taken to the operating room with IV running.  She was placed in the dorsal lithotomy position.  She underwent general endotracheal anesthesia without complication.   She was prepped in a normal fashion.  A timeout was performed.  SCDs were on her legs and operating and she received Ancef 2 grams IV prior to the procedure.  Attention was turned to the vagina.  Cervical mucus was noted.  Reprep of the cervix with Hibiclens and then a single tooth tenaculum was used to grasp the anterior lip of the cervix and then the Hulka uterine manipulator was then advanced through an  anteverted uterus.  Graves speculum was removed from the vagina.   Attention was turned to the abdomen.  Marcaine was used in the periumbilical incision.  A #11 blade scalpel was used to make a small incision at 12 o'clock.  The 5 mm trocar was then advanced with direct  visualization, which was rather easy.  The fascia did not seem very tough at all.  Intra-abdominal access was confirmed and then the other 5 mm trocar ports were inserted in a similar fashion with premedication with the Marcaine.  The patient was placed  in Trendelenburg and the bowel was retracted to see the posterior portion of the uterus.  The tubes were then grasped with the atraumatic grasper and transected with the Harmonic scalpel.  I attempted to take it through the 5 mm port, but it got stuck, so that was placed in the cul-de-sac.  The left ovary was adhered to the pelvic sidewall  and to the uterus, so I wanted to resect the broad ligament so that when we were from below, would not have any difficulty removing that side.  In doing so though, some uterine bleeding was encountered and hemostasis was achieved with the Kleppinger.   The same thing was done on the opposite side.  There was a right paratubal cyst that was stuck to the ovary, but once she started resecting, the fimbria was adhered to the ovary, so the paratubal cyst was removed and incidentally drained.  There was a  small portion of the fimbria that was left on the ovary, which I did not want to remove for risk of bleeding from the ovary.  The tube was then removed, placed in the cul-de-sac.  The IP ligament was densely pulled in to the right parametrial  space and  that was transected with the Harmonic scalpel and the ovary and tube fell away.  No concerns of viability.  The ovary appeared normal at the end of the procedure.  Bladder flap was developed.  Then, we went below.  Weighted speculum was placed in the  vagina.  The cervix was then identified and grasped with two single tooth tenaculums.  The bladder and rectum prolapsed into  the field with relaxation.  The retractors were used to identify the cervix.  Paracervical block was performed with the  vasopressin until blanching noted.  The vaginal mucosa was then incised with the Bovie in a circumferential pattern fashion and hemostasis with the Bovie as needed.    I had concerns of entering the posterior cul-de-sac due to the adhesions.  I stayed low and near the cervix.  I entered the posterior cul-de-sac without difficulty or injury.  A long weighted speculum was then placed and did the same on the anterior.  I  went low because the bladder was protruding down, but I was unable to see the peritoneum so I took the uterosacral ligaments on both sides with the Heaney clamps and suture ligated and tagged them with the DICTATION ENDS HERE  TN/NUANCE  D:02/25/2018 T:02/25/2018 JOB:[id number]

## 2018-06-23 ENCOUNTER — Telehealth: Payer: Self-pay | Admitting: Advanced Practice Midwife

## 2018-06-23 NOTE — Telephone Encounter (Signed)
Joanne Wilson, age 48 G3P3, s/p partial hysterectomy 02/25/18, called the after-hours line 06/23/18 at 2130 due to passing blood clots in her urine.   She states she has been having hematuria, bladder pain, sensation of "something not right down there", urinary frequency, possible urinary incontinence during sex, "my bladder seems weak," ever since surgery and has seen Dr. Simona Huh in clinic for same as recently as one or two months ago. Findings at that time were negative for UTI and she was prescribed oxybutynin.  Tonight she is afebrile, describe the amount of blood in the commode as "like when I am on my period," including some little clots, which is what prompted her call. I consulted Dr. Alwyn Pea to make sure it was OK for Joanne Wilson to follow up in the clinic tomorrow and she agreed. So I called [name] back and let her know that we will tell Dr. Simona Huh in the morning that she was continuing to have problems and that Dr. Simona Huh could decide whether she needed some imaging studies or what type of follow then. I advised her to please call back overnight if anything changed, worsened, or she was concerned for any reason and assured her that I would be here if she needed me.   She was content to avoid the hospital for tonight and did not feel it was an emergency that couldn't wait until the morning.  Altha Harm, CNM

## 2019-03-01 ENCOUNTER — Telehealth: Payer: Self-pay | Admitting: *Deleted

## 2019-03-01 DIAGNOSIS — Z20828 Contact with and (suspected) exposure to other viral communicable diseases: Secondary | ICD-10-CM

## 2019-03-01 DIAGNOSIS — Z20822 Contact with and (suspected) exposure to covid-19: Secondary | ICD-10-CM

## 2019-03-01 NOTE — Telephone Encounter (Signed)
Lab orders only 

## 2019-03-02 LAB — NOVEL CORONAVIRUS, NAA: SARS-CoV-2, NAA: DETECTED — AB

## 2019-03-05 MED ORDER — AMOXICILLIN-POT CLAVULANATE 875-125 MG PO TABS: 1.0000 | ORAL_TABLET | Freq: Two times a day (BID) | ORAL | 0 refills | Status: AC

## 2019-03-05 NOTE — Telephone Encounter (Signed)
Sent in Augmentin to treat secondary sinus infections. Confirmed pharmacy with the patient.  Salvatore Marvel, MD [facility] [place] [place]

## 2019-03-05 NOTE — Addendum Note (Signed)
Addended by: Valentina Shaggy on: 03/05/2019 10:08 AM   Modules accepted: Orders

## 2019-06-15 ENCOUNTER — Telehealth: Payer: Self-pay | Admitting: Allergy & Immunology

## 2019-06-15 ENCOUNTER — Other Ambulatory Visit: Payer: Self-pay | Admitting: *Deleted

## 2019-06-15 DIAGNOSIS — R5383 Other fatigue: Secondary | ICD-10-CM

## 2019-06-15 DIAGNOSIS — Z20822 Contact with and (suspected) exposure to covid-19: Secondary | ICD-10-CM

## 2019-06-15 DIAGNOSIS — T7840XD Allergy, unspecified, subsequent encounter: Secondary | ICD-10-CM

## 2019-06-15 NOTE — Telephone Encounter (Signed)
Patient contacted me to report 2-3 days of itching and periorbital swelling. She is already on prednisone for this and antihistamines. Allergen panel ordered. I will discuss more with her at her New Patient appointment in April.  Salvatore Marvel, MD [facility] [place] [facility] [place]

## 2019-06-15 NOTE — Addendum Note (Signed)
Addended by: Valentina Shaggy on: 06/15/2019 06:47 PM   Modules accepted: Orders

## 2019-06-15 NOTE — Addendum Note (Signed)
Addended by: Valentina Shaggy on: 06/15/2019 06:55 PM   Modules accepted: Orders

## 2019-06-16 LAB — VITAMIN D 25 HYDROXY (VIT D DEFICIENCY, FRACTURES): Vit D, 25-Hydroxy: 21.1 ng/mL — ABNORMAL LOW (ref 30.0–100.0)

## 2019-06-16 LAB — NOVEL CORONAVIRUS, NAA: SARS-CoV-2, NAA: NOT DETECTED

## 2019-06-17 LAB — IGE+ALLERGENS ZONE 2(30)
Alternaria Alternata IgE: <0.1 kU/L
Amer Sycamore IgE Qn: <0.1 kU/L
Aspergillus Fumigatus IgE: <0.1 kU/L
Bahia Grass IgE: <0.1 kU/L
Bermuda Grass IgE: <0.1 kU/L
Cat Dander IgE: <0.1 kU/L
Cedar, Mountain IgE: <0.1 kU/L
Cladosporium Herbarum IgE: <0.1 kU/L
Cockroach, American IgE: <0.1 kU/L
Common Silver Birch IgE: <0.1 kU/L
D Farinae IgE: <0.1 kU/L
D Pteronyssinus IgE: <0.1 kU/L
Dog Dander IgE: <0.1 kU/L
Elm, American IgE: <0.1 kU/L
Hickory, White IgE: <0.1 kU/L
IgE (Immunoglobulin E), Serum: 3 IU/mL — ABNORMAL LOW (ref 6–495)
Johnson Grass IgE: <0.1 kU/L
Maple/Box Elder IgE: <0.1 kU/L
Mucor Racemosus IgE: <0.1 kU/L
Mugwort IgE Qn: <0.1 kU/L
Nettle IgE: <0.1 kU/L
Oak, White IgE: <0.1 kU/L
Penicillium Chrysogen IgE: <0.1 kU/L
Pigweed, Rough IgE: <0.1 kU/L
Plantain, English IgE: <0.1 kU/L
Ragweed, Short IgE: <0.1 kU/L
Sheep Sorrel IgE Qn: <0.1 kU/L
Stemphylium Herbarum IgE: <0.1 kU/L
Sweet gum IgE RAST Ql: <0.1 kU/L
Timothy Grass IgE: <0.1 kU/L
White Mulberry IgE: <0.1 kU/L

## 2019-07-05 ENCOUNTER — Ambulatory Visit: Payer: Self-pay | Admitting: Allergy & Immunology

## 2019-08-09 ENCOUNTER — Other Ambulatory Visit (HOSPITAL_BASED_OUTPATIENT_CLINIC_OR_DEPARTMENT_OTHER): Payer: Self-pay | Admitting: Obstetrics and Gynecology

## 2019-08-09 DIAGNOSIS — Z1231 Encounter for screening mammogram for malignant neoplasm of breast: Secondary | ICD-10-CM

## 2019-08-10 DIAGNOSIS — Z9889 Other specified postprocedural states: Secondary | ICD-10-CM

## 2019-08-10 DIAGNOSIS — R112 Nausea with vomiting, unspecified: Secondary | ICD-10-CM

## 2019-08-10 DIAGNOSIS — G5602 Carpal tunnel syndrome, left upper limb: Secondary | ICD-10-CM | POA: Insufficient documentation

## 2019-08-10 DIAGNOSIS — K219 Gastro-esophageal reflux disease without esophagitis: Secondary | ICD-10-CM | POA: Insufficient documentation

## 2019-08-10 DIAGNOSIS — M5410 Radiculopathy, site unspecified: Secondary | ICD-10-CM | POA: Insufficient documentation

## 2019-08-10 HISTORY — DX: Other specified postprocedural states: R11.2

## 2019-08-10 HISTORY — DX: Other specified postprocedural states: Z98.890

## 2019-08-13 ENCOUNTER — Ambulatory Visit (HOSPITAL_BASED_OUTPATIENT_CLINIC_OR_DEPARTMENT_OTHER): Payer: No Typology Code available for payment source

## 2019-08-20 ENCOUNTER — Ambulatory Visit (INDEPENDENT_AMBULATORY_CARE_PROVIDER_SITE_OTHER): Payer: No Typology Code available for payment source | Admitting: Internal Medicine

## 2019-08-20 ENCOUNTER — Ambulatory Visit (HOSPITAL_BASED_OUTPATIENT_CLINIC_OR_DEPARTMENT_OTHER): Payer: No Typology Code available for payment source

## 2019-08-20 ENCOUNTER — Encounter: Payer: Self-pay | Admitting: Internal Medicine

## 2019-08-20 ENCOUNTER — Other Ambulatory Visit: Payer: Self-pay

## 2019-08-20 VITALS — BP 141/81 | HR 79 | Temp 97.9°F | Resp 18 | Ht 67.0 in | Wt 202.1 lb

## 2019-08-20 DIAGNOSIS — R5383 Other fatigue: Secondary | ICD-10-CM

## 2019-08-20 DIAGNOSIS — E559 Vitamin D deficiency, unspecified: Secondary | ICD-10-CM | POA: Diagnosis not present

## 2019-08-20 DIAGNOSIS — D649 Anemia, unspecified: Secondary | ICD-10-CM

## 2019-08-20 DIAGNOSIS — Z0000 Encounter for general adult medical examination without abnormal findings: Secondary | ICD-10-CM | POA: Insufficient documentation

## 2019-08-20 DIAGNOSIS — Z0189 Encounter for other specified special examinations: Secondary | ICD-10-CM

## 2019-08-20 LAB — LIPID PANEL
Cholesterol: 231 mg/dL — ABNORMAL HIGH (ref 0–200)
HDL: 67.9 mg/dL (ref >39.00)
LDL Cholesterol: 148 mg/dL — ABNORMAL HIGH (ref 0–99)
NonHDL: 163.06
Total CHOL/HDL Ratio: 3
Triglycerides: 75 mg/dL (ref 0.0–149.0)
VLDL: 15 mg/dL (ref 0.0–40.0)

## 2019-08-20 LAB — COMPREHENSIVE METABOLIC PANEL
ALT: 14 U/L (ref 0–35)
AST: 14 U/L (ref 0–37)
Albumin: 4.3 g/dL (ref 3.5–5.2)
Alkaline Phosphatase: 79 U/L (ref 39–117)
BUN: 13 mg/dL (ref 6–23)
CO2: 27 mEq/L (ref 19–32)
Calcium: 9.1 mg/dL (ref 8.4–10.5)
Chloride: 106 mEq/L (ref 96–112)
Creatinine, Ser: 0.99 mg/dL (ref 0.40–1.20)
GFR: 72.17 mL/min (ref >60.00)
Glucose, Bld: 92 mg/dL (ref 70–99)
Potassium: 4 mEq/L (ref 3.5–5.1)
Sodium: 136 mEq/L (ref 135–145)
Total Bilirubin: 0.5 mg/dL (ref 0.2–1.2)
Total Protein: 6.9 g/dL (ref 6.0–8.3)

## 2019-08-20 LAB — CBC WITH DIFFERENTIAL/PLATELET
Basophils Absolute: 0 10*3/uL (ref 0.0–0.1)
Basophils Relative: 0.6 % (ref 0.0–3.0)
Eosinophils Absolute: 0 10*3/uL (ref 0.0–0.7)
Eosinophils Relative: 0.6 % (ref 0.0–5.0)
HCT: 37.3 % (ref 36.0–46.0)
Hemoglobin: 12 g/dL (ref 12.0–15.0)
Lymphocytes Relative: 34.1 % (ref 12.0–46.0)
Lymphs Abs: 1.6 10*3/uL (ref 0.7–4.0)
MCHC: 32.3 g/dL (ref 30.0–36.0)
MCV: 78.9 fl (ref 78.0–100.0)
Monocytes Absolute: 0.5 10*3/uL (ref 0.1–1.0)
Monocytes Relative: 11.3 % (ref 3.0–12.0)
Neutro Abs: 2.5 10*3/uL (ref 1.4–7.7)
Neutrophils Relative %: 53.4 % (ref 43.0–77.0)
Platelets: 247 10*3/uL (ref 150.0–400.0)
RBC: 4.73 Mil/uL (ref 3.87–5.11)
RDW: 14.5 % (ref 11.5–15.5)
WBC: 4.6 10*3/uL (ref 4.0–10.5)

## 2019-08-20 LAB — VITAMIN D 25 HYDROXY (VIT D DEFICIENCY, FRACTURES): VITD: 20.98 ng/mL — ABNORMAL LOW (ref 30.00–100.00)

## 2019-08-20 LAB — B12 AND FOLATE PANEL
Folate: 15.7 ng/mL (ref >5.9)
Vitamin B-12: 743 pg/mL (ref 211–911)

## 2019-08-20 LAB — TSH: TSH: 1.35 u[IU]/mL (ref 0.35–4.50)

## 2019-08-20 LAB — HEMOGLOBIN A1C: Hgb A1c MFr Bld: 5.9 % (ref 4.6–6.5)

## 2019-08-20 NOTE — Patient Instructions (Addendum)
COVID-19 Vaccine Information can be found at: ShippingScam.co.uk For questions related to vaccine distribution or appointments, please email [email or web address] or call [phone].   After you proceed with your Covid vaccination, think about getting Tdap here or at your pharmacy  As far as over-the-counter medications you only need: 1 multivitamin daily Vitamin D 1000 units daily  We will refer you to the neurologist for evaluation of his sleep apnea.  GO TO THE LAB : Get the blood work     [place], [place] Come back for physical in 1 year, sooner if needed

## 2019-08-20 NOTE — Progress Notes (Signed)
Subjective:    Patient ID: Joanne Wilson, female    DOB: Jun 19, 1970, 49 y.o.   MRN: [id number]  DOS:  08/20/2019 Type of visit - description: New patient, to get established, CPX  Currently she feels at baseline. Reports chronic feeling of being tired all the time. She does not snore to her knowledge but reports that she is very sleepy throughout the day. No anxiety, depression, chest pain, difficulty breathing.  She has bilateral ankle swelling at the end of the day on and off for years.  Sometimes uses compression stockings  Reports chronic "leaky ears".  No pain, discharge or bleeding, just the ears are scaly  Review of Systems  Other than above, a 14 point review of systems is negative     Past Medical History:  Diagnosis Date  . Carpal tunnel syndrome   . Family history of breast cancer   . Finger fracture, left    5th digit (small finger)  . GERD (gastroesophageal reflux disease)   . PONV (postoperative nausea and vomiting)   . PONV (postoperative nausea and vomiting) 08/10/2019  . Varicose vein of leg     Past Surgical History:  Procedure Laterality Date  . AUGMENTATION MAMMAPLASTY Bilateral 2014  . LAPAROSCOPIC VAGINAL HYSTERECTOMY WITH SALPINGECTOMY Bilateral 02/25/2018   Procedure: LAPAROSCOPIC ASSISTED VAGINAL HYSTERECTOMY WITH SALPINGECTOMY;  Surgeon: [name], MD;  Location: [facility];  Service: Gynecology;  Laterality: Bilateral;  . OPEN REDUCTION INTERNAL FIXATION (ORIF) METACARPAL Left 07/10/2017   Procedure: OPEN REDUCTION INTERNAL FIXATION (ORIF) METACARPAL;  Surgeon: Iran Planas, MD;  Location: [facility];  Service: Orthopedics;  Laterality: Left;  . TUBAL LIGATION Bilateral 2005   PPTL   Family History  Problem Relation Age of Onset  . Breast cancer Mother 5  . Heart failure Father   . Hypertension Father   . Yves Dill Parkinson White syndrome Brother   . Breast cancer Maternal Grandmother 78  . Breast cancer Other   . Breast  cancer Cousin        mother's maternal first cousin dx 2x under 66  . Colon cancer Neg Hx     Allergies as of 08/20/2019   No Known Allergies     Medication List       Accurate as of Aug 20, 2019  1:59 PM. If you have any questions, ask your nurse or doctor.        STOP taking these medications   acetaminophen 500 MG tablet Commonly known as: TYLENOL Stopped by: Kathlene November, MD   BLACK CURRANT [place] by: Kathlene November, MD   Digestive Enzyme Caps Stopped by: Kathlene November, MD   Fusion Plus Caps Stopped by: Kathlene November, MD   OVER THE COUNTER MEDICATION Stopped by: Kathlene November, MD   oxybutynin 5 MG tablet Commonly known as: DITROPAN Stopped by: Kathlene November, MD     TAKE these medications   ibuprofen 400 MG tablet Commonly known as: ADVIL Take 400 mg by mouth every 6 (six) hours as needed. What changed: Another medication with the same name was removed. Continue taking this medication, and follow the directions you see here. Changed by: Kathlene November, MD   multivitamin with minerals Tabs tablet Take 1 tablet by mouth daily.   OMEGA-3 COMPLEX PO Take by mouth. OMEGA XL   OVER THE COUNTER MEDICATION Take 2 tablets by mouth daily. Omega 7 otc supplement   sertraline 50 MG tablet Commonly known as: ZOLOFT Take 25 mg by  mouth daily as needed (for sweating).   THRIVE FOR LIFE WOMENS PO Take by mouth.          Objective:   Physical Exam BP (!) 141/81 (BP Location: Right Arm, Patient Position: Sitting, Cuff Size: Normal)   Pulse 79   Temp 97.9 F (36.6 C) (Temporal)   Resp 18   Ht 5\' 7"  (1.702 m)   Wt 202 lb 2 oz (91.7 kg)   LMP 02/13/2018   SpO2 98%   BMI 31.66 kg/m  General: Well developed, NAD, BMI noted Neck: No  thyromegaly  HEENT:  Normocephalic . Face symmetric, atraumatic. TMs, ear canal external ear normal. Lungs:  CTA B Normal respiratory effort, no intercostal retractions, no accessory muscle use. Heart: RRR,  no murmur.  Abdomen:  Not  distended, soft, non-tender. No rebound or rigidity.   Lower extremities: no pretibial edema bilaterally  Skin: Exposed areas without rash. Not pale. Not jaundice Neurologic:  alert & oriented X3.  Speech normal, gait appropriate for age and unassisted Strength symmetric and appropriate for age.  Psych: Cognition and judgment appear intact.  Cooperative with normal attention span and concentration.  Behavior appropriate. No anxious or depressed appearing.     Assessment    Assessment (new patient 07/2019) GERD Vit D def  Laparoscopic vaginal hysterectomy with salpingectomy (no oophorectomy), d/t fibroids, DUB ? Thalassemia  PLAN New patient, here for CPX H/o  GERD: Currently asymptomatic, on no medications H/o vitamin D deficiency, not on supplements other than multivitamins, checking levels Chronic fatigue: As described above, Epworth scale 14. Despite the fact she does not snore I suspect OSA.  Refer to neurology. Ear eczema: What the patient described as "leaky years" is mostly dry/scally concha, likely d/t  eczema, today exam is normal.  Recommend OTC hydrocortisone as needed Ankle edema: Likely dependent.  Request a water pill, doubt this can help.  Recommend low-salt diet and compression stockings Perimenopausal?  She has episode of hot flash, sertraline as needed help significantly. RTC 1 year, sooner as needed.  This visit occurred during the SARS-CoV-2 public health emergency.  Safety protocols were in place, including screening questions prior to the visit, additional usage of staff PPE, and extensive cleaning of exam room while observing appropriate contact time as indicated for disinfecting solutions.

## 2019-08-20 NOTE — Assessment & Plan Note (Signed)
Tdap: Last?  Recommend to proceed 2 weeks after Covid shots COVID-19 vaccination: Has not proceed, recommend to do.  Pros and cons discussed.    Female care: Per gynecology, Dr. Vicki Mallet, had a mammogram 01-2018 plans to have another one soon. CCS: Never had a colonoscopy, no family history, reassess next year Labs: CMP, CBC, FLP, A1c, TSH, vitamin D, 123456, folic acid, 123456 Diet exercise discussed

## 2019-08-20 NOTE — Progress Notes (Signed)
Pre visit review using our clinic review tool, if applicable. No additional management support is needed unless otherwise documented below in the visit note. 

## 2019-08-21 LAB — IRON,TIBC AND FERRITIN PANEL
%SAT: 22 % (calc) (ref 16–45)
Ferritin: 38 ng/mL (ref 16–232)
Iron: 70 ug/dL (ref 40–190)
TIBC: 319 mcg/dL (calc) (ref 250–450)

## 2019-08-23 MED ORDER — VITAMIN D (ERGOCALCIFEROL) 1.25 MG (50000 UNIT) PO CAPS
50000.0000 [IU] | ORAL_CAPSULE | ORAL | 0 refills | Status: DC
Start: 2019-08-23 — End: 2020-06-27

## 2019-08-23 NOTE — Addendum Note (Signed)
Addended byDamita Dunnings D on: 08/23/2019 01:30 PM   Modules accepted: Orders

## 2019-09-02 ENCOUNTER — Encounter: Payer: Self-pay | Admitting: Emergency Medicine

## 2019-09-02 ENCOUNTER — Emergency Department
Admission: EM | Admit: 2019-09-02 | Discharge: 2019-09-02 | Disposition: A | Discharge status: Home / Self Care | Payer: No Typology Code available for payment source | Source: Home / Self Care

## 2019-09-02 ENCOUNTER — Other Ambulatory Visit: Payer: Self-pay

## 2019-09-02 ENCOUNTER — Ambulatory Visit (INDEPENDENT_AMBULATORY_CARE_PROVIDER_SITE_OTHER)
Admission: RE | Admit: 2019-09-02 | Discharge: 2019-09-02 | Disposition: A | Discharge status: Home / Self Care | Payer: No Typology Code available for payment source | Source: Ambulatory Visit

## 2019-09-02 ENCOUNTER — Emergency Department (INDEPENDENT_AMBULATORY_CARE_PROVIDER_SITE_OTHER): Payer: No Typology Code available for payment source

## 2019-09-02 DIAGNOSIS — R140 Abdominal distension (gaseous): Secondary | ICD-10-CM

## 2019-09-02 DIAGNOSIS — R300 Dysuria: Secondary | ICD-10-CM

## 2019-09-02 DIAGNOSIS — K6389 Other specified diseases of intestine: Secondary | ICD-10-CM | POA: Diagnosis not present

## 2019-09-02 DIAGNOSIS — K5900 Constipation, unspecified: Secondary | ICD-10-CM

## 2019-09-02 DIAGNOSIS — R1084 Generalized abdominal pain: Secondary | ICD-10-CM | POA: Diagnosis not present

## 2019-09-02 LAB — POCT CBC W AUTO DIFF (K'VILLE URGENT CARE)

## 2019-09-02 LAB — POCT URINALYSIS DIP (MANUAL ENTRY)
Bilirubin, UA: NEGATIVE
Glucose, UA: NEGATIVE mg/dL
Ketones, POC UA: NEGATIVE mg/dL
Leukocytes, UA: NEGATIVE
Nitrite, UA: NEGATIVE
Protein Ur, POC: NEGATIVE mg/dL
Spec Grav, UA: >=1.03 — AB (ref 1.010–1.025)
Urobilinogen, UA: 0.2 E.U./dL
pH, UA: 5.5 (ref 5.0–8.0)

## 2019-09-02 MED ORDER — IOHEXOL 300 MG/ML  SOLN
100.0000 mL | Freq: Once | INTRAMUSCULAR | Status: AC | PRN
Start: 2019-09-02 — End: 2019-09-02
  Administered 2019-09-02: 100 mL via INTRAVENOUS

## 2019-09-02 NOTE — ED Provider Notes (Signed)
[facility]    CSN: [id number] Arrival date & time: 09/02/19  1520      History   Chief Complaint Chief Complaint  Patient presents with  . Abdominal Pain    HPI Joanne Wilson is a 49 y.o. female.   HPI Joanne Wilson is a 49 y.o. female presenting to UC with c/o 5 days of worsening diffuse abdominal pain that has been waxing and waning, associated gassiness, sharp cramping pain in upper and lower abdomen. Pt feels constipated for the last several weeks, worse this week but did have 3 episodes of hard stools yesterday.  She also reports feeling like she cannot fully empty her bladder due to feeling bloated.  Denies fever, chills, n/v/d. Hx of hysterectomy in 2019.  She had a UTI shortly after that surgery but no other complications and no hx of prior UTIs.  Denies chest pain, SOB or back pain.   She does report eating a whole can of peanuts within 1 week which is not normal for her but denies hx of diverticulitis.   Past Medical History:  Diagnosis Date  . Carpal tunnel syndrome   . Family history of breast cancer   . Finger fracture, left    5th digit (small finger)  . GERD (gastroesophageal reflux disease)   . PONV (postoperative nausea and vomiting)   . PONV (postoperative nausea and vomiting) 08/10/2019  . Varicose vein of leg     Patient Active Problem List   Diagnosis Date Noted  . Annual physical exam 08/20/2019  . Radiculopathy 08/10/2019  . Carpal tunnel syndrome, left 08/10/2019  . GERD (gastroesophageal reflux disease) 08/10/2019  . PONV (postoperative nausea and vomiting) 08/10/2019  . Fibroids 02/25/2018  . S/P laparoscopic assisted vaginal hysterectomy (LAVH) 02/25/2018  . Metacarpal bone fracture 06/16/2017  . Family history of breast cancer     Past Surgical History:  Procedure Laterality Date  . AUGMENTATION MAMMAPLASTY Bilateral 2014  . LAPAROSCOPIC VAGINAL HYSTERECTOMY WITH SALPINGECTOMY Bilateral 02/25/2018   Procedure: LAPAROSCOPIC  ASSISTED VAGINAL HYSTERECTOMY WITH SALPINGECTOMY;  Surgeon: [name], MD;  Location: [facility];  Service: Gynecology;  Laterality: Bilateral;  . OPEN REDUCTION INTERNAL FIXATION (ORIF) METACARPAL Left 07/10/2017   Procedure: OPEN REDUCTION INTERNAL FIXATION (ORIF) METACARPAL;  Surgeon: Iran Planas, MD;  Location: [facility];  Service: Orthopedics;  Laterality: Left;  . TUBAL LIGATION Bilateral 2005   PPTL    OB History   No obstetric history on file.      Home Medications    Prior to Admission medications   Medication Sig Start Date End Date Taking? Authorizing Provider  DHA-EPA-Vitamin E (OMEGA-3 COMPLEX PO) Take by mouth. OMEGA XL    [provider]  ibuprofen (ADVIL) 400 MG tablet Take 400 mg by mouth every 6 (six) hours as needed.    [provider]  Multiple Vitamin (MULTIVITAMIN WITH MINERALS) TABS tablet Take 1 tablet by mouth daily.    [provider]  Multiple Vitamins-Minerals (THRIVE FOR LIFE WOMENS PO) Take by mouth.    [provider]  OVER THE COUNTER MEDICATION Take 2 tablets by mouth daily. Omega 7 otc supplement    [provider]  sertraline (ZOLOFT) 50 MG tablet Take 25 mg by mouth daily as needed (for sweating).    [provider]  Vitamin D, Ergocalciferol, (DRISDOL) 1.25 MG (50000 UNIT) CAPS capsule Take 1 capsule (50,000 Units total) by mouth every 7 (seven) days. 08/23/19   Colon Branch,  MD    Family History Family History  Problem Relation Age of Onset  . Breast cancer Mother 40  . Heart failure Father   . Hypertension Father   . Yves Dill Parkinson White syndrome Brother   . Breast cancer Maternal Grandmother 78  . Breast cancer Other   . Breast cancer Cousin        mother's maternal first cousin dx 2x under 69  . Colon cancer Neg Hx     Social History Social History   Tobacco Use  . Smoking status: Never Smoker  . Smokeless tobacco: Never Used  Substance Use Topics  . Alcohol  use: No  . Drug use: No     Allergies   Patient has no known allergies.   Review of Systems Review of Systems  Constitutional: Negative for chills and fever.  Respiratory: Negative for cough and shortness of breath.   Cardiovascular: Negative for chest pain.  Gastrointestinal: Positive for abdominal distention, abdominal pain and constipation. Negative for blood in stool, diarrhea, nausea and vomiting.  Genitourinary: Positive for decreased urine volume. Negative for dysuria and frequency.     Physical Exam Triage Vital Signs ED Triage Vitals  Enc Vitals Group     BP 09/02/19 1533 (!) 150/84     Pulse Rate 09/02/19 1533 95     Resp --      Temp 09/02/19 1533 98.8 F (37.1 C)     Temp Source 09/02/19 1533 Oral     SpO2 09/02/19 1533 99 %     Weight 09/02/19 1534 205 lb (93 kg)     Height 09/02/19 1534 5\' 7"  (1.702 m)     Head Circumference --      Peak Flow --      Pain Score 09/02/19 1533 8     Pain Loc --      Pain Edu? --      Excl. in [place]? --    No data found.  Updated Vital Signs BP (!) 150/84 (BP Location: Right Arm)   Pulse 95   Temp 98.8 F (37.1 C) (Oral)   Ht 5\' 7"  (1.702 m)   Wt 205 lb (93 kg)   LMP 02/13/2018   SpO2 99%   BMI 32.11 kg/m   Visual Acuity Right Eye Distance:   Left Eye Distance:   Bilateral Distance:    Right Eye Near:   Left Eye Near:    Bilateral Near:     Physical Exam Vitals and nursing note reviewed.  Constitutional:      General: She is not in acute distress.    Appearance: She is well-developed. She is not ill-appearing, toxic-appearing or diaphoretic.  HENT:     Head: Normocephalic and atraumatic.  Cardiovascular:     Rate and Rhythm: Normal rate and regular rhythm.  Pulmonary:     Effort: Pulmonary effort is normal. No respiratory distress.     Breath sounds: Normal breath sounds.  Abdominal:     General: There is no distension.     Palpations: Abdomen is soft.     Tenderness: There is generalized abdominal  tenderness. There is no right CVA tenderness, left CVA tenderness, guarding or rebound. Negative signs include Murphy's sign and McBurney's sign.  Musculoskeletal:        General: Normal range of motion.     Cervical back: Normal range of motion.  Skin:    General: Skin is warm and dry.  Neurological:     Mental Status: She  is alert and oriented to person, place, and time.  Psychiatric:        Behavior: Behavior normal.      UC Treatments / Results  Labs (all labs ordered are listed, but only abnormal results are displayed) Labs Reviewed  POCT URINALYSIS DIP (MANUAL ENTRY) - Abnormal; Notable for the following components:      Result Value   Clarity, UA cloudy (*)    Spec Grav, UA >=1.030 (*)    Blood, UA small (*)    All other components within normal limits  COMPLETE METABOLIC PANEL WITH GFR  POCT CBC W AUTO DIFF ([facility])    EKG   Radiology CT ABDOMEN PELVIS W CONTRAST  Result Date: 09/02/2019 CLINICAL DATA:  Diffuse abdominal pain, bloating EXAM: CT ABDOMEN AND PELVIS WITH CONTRAST TECHNIQUE: Multidetector CT imaging of the abdomen and pelvis was performed using the standard protocol following bolus administration of intravenous contrast. CONTRAST:  138mL OMNIPAQUE IOHEXOL 300 MG/ML  SOLN COMPARISON:  None. FINDINGS: Lower chest: No acute abnormality. Hepatobiliary: No focal liver abnormality is seen. No gallstones, gallbladder wall thickening, or biliary dilatation. Pancreas: Unremarkable. No pancreatic ductal dilatation or surrounding inflammatory changes. Spleen: Normal in size without focal abnormality. Adrenals/Urinary Tract: Unremarkable adrenal glands. Kidneys enhance symmetrically without focal lesion, stone, or hydronephrosis. Ureters are nondilated. Urinary bladder is largely decompressed limiting its evaluation. Stomach/Bowel: There is an ovoid 2.0 x 1.1 cm fat attenuation structure adjacent to the distal sigmoid colon with a thin hyperattenuating rim and  surrounding inflammatory fat stranding (series 2, images 56-57) most compatible with an inflamed epiploic appendage. Remaining bowel appear within normal limits. No dilated loops of bowel to suggest obstruction. Appendix is not definitively identified. No pericecal inflammatory changes to suggest appendicitis. Vascular/Lymphatic: No significant vascular findings are present. No enlarged abdominal or pelvic lymph nodes. Reproductive: Surgically absent uterus.  No adnexal masses. Other: Trace free fluid within the pelvis. No organized fluid collection or abscess. No free intraperitoneal air. No abdominal wall hernia. Musculoskeletal: Slight lumbar levocurvature. No acute osseous abnormality. Multiple areas of soft tissue nodularity within the subcutaneous fat overlying the bilateral gluteal regions suggesting injection granulomas. IMPRESSION: 1. Findings most compatible with acute epiploic appendagitis of the distal sigmoid colon. 2. Trace free fluid within the pelvis. No organized fluid collection or abscess. 3. Multiple areas of soft tissue nodularity within the subcutaneous fat overlying the bilateral gluteal regions suggesting injection granulomas. Correlate with patient's history. Electronically Signed   By: Davina Poke D.O.   On: 09/02/2019 16:53    Procedures Procedures (including critical care time)  Medications Ordered in UC Medications - No data to display  Initial Impression / Assessment and Plan / UC Course  I have reviewed the triage vital signs and the nursing notes.  Pertinent labs & imaging results that were available during my care of the patient were reviewed by me and considered in my medical decision making (see chart for details).     Consulted with Dr. Madilyn Fireman, primary care, and referenced UpToDate.  Agreement pt may be treated conservatively out patient. Discussed imaging with pt.  Discussed treatment for constipation and pain. Encouraged fluids. Recommended pt f/u  with PCP tomorrow if possible for recheck of symptoms. Discussed symptoms that warrant emergent care in the ED. Pt verbalized understanding and agreement with tx plan. AVS provided.  Final Clinical Impressions(s) / UC Diagnoses   Final diagnoses:  Diffuse abdominal pain  Bloating  Dysuria  Epiploic appendagitis  Constipation, unspecified constipation  type     Discharge Instructions      Be sure to stay well hydrated. You may try taking Colace and Miralax or an over the counter laxative such as milk of magnesium (ask pharmacist for assistance) to help with your ongoing constipation. You may take 600mg  ibuprofen every 6-8 hours for the pain and inflammation along with acetaminophen 500mg  every 4-6 hours for pain.   Try to schedule a follow up appointment with your primary care provider tomorrow for recheck of symptoms or by Monday.  Call 911 or go to the hospital if symptoms worsening this evening or this weekend, worsening abdominal pain, worsening bloating, fever, vomiting, difficulty emptying your bladder, or other new concerning symptoms develop.     ED Prescriptions    None     PDMP not reviewed this encounter.   Noe Gens, [place] 09/02/19 Bosie Helper

## 2019-09-02 NOTE — ED Provider Notes (Signed)
Virtual Visit via Video Note:  Joanne Wilson  initiated request for Telemedicine visit with [facility] team. I connected with Joanne Wilson  on 09/03/2019 at 10:56 AM  for a synchronized telemedicine visit using a video enabled HIPPA compliant telemedicine application. I verified that I am speaking with Joanne Wilson  using two identifiers. Orvan July, NP  was physically located in a [facility] Urgent care site and Joanne Wilson was located at a different location.   The limitations of evaluation and management by telemedicine as well as the availability of in-person appointments were discussed. Patient was informed that she  may incur a bill ( including co-pay) for this virtual visit encounter. Joanne Wilson  expressed understanding and gave verbal consent to proceed with virtual visit.     History of Present Illness:Joanne Wilson  is a 49 y.o. female presents with 5 days of stomach pain. Reports generalized swelling in the abdomen and bloating. Reports pain with digestion and sharp pain at times. Reports painful BM. Reports mucous with stool and going 3 or 4 times a day. Nausea without vomiting. Formed BM and small. No bloody stool. Takes tea to regulate BM. No fever. She is passing gas.  No blood in stool, fevers, chills, body aches.  No focal tenderness of the abdomen.  Not currently having any pain.  She has been eating and drinking normally.  No dysuria, hematuria, urinary frequency, vaginal discharge or bleeding.  History of hysterectomy.  No NSAID use or history of gastric bypass.  Past Medical History:  Diagnosis Date  . Carpal tunnel syndrome   . Family history of breast cancer   . Finger fracture, left    5th digit (small finger)  . GERD (gastroesophageal reflux disease)   . PONV (postoperative nausea and vomiting)   . PONV (postoperative nausea and vomiting) 08/10/2019  . Varicose vein of leg     No Known Allergies      Observations/Objective: VITALS:  Per patient if applicable, see vitals. GENERAL: Alert, appears well and in no acute distress. HEENT: Atraumatic, conjunctiva clear, no obvious abnormalities on inspection of external nose and ears. NECK: Normal movements of the head and neck. CARDIOPULMONARY: No increased WOB. Speaking in clear sentences. I:E ratio WNL.  MS: Moves all visible extremities without noticeable abnormality. PSYCH: Pleasant and cooperative, well-groomed. Speech normal rate and rhythm. Affect is appropriate. Insight and judgement are appropriate. Attention is focused, linear, and appropriate.  NEURO: CN grossly intact. Oriented as arrived to appointment on time with no prompting. Moves both UE equally.  SKIN: No obvious lesions, wounds, erythema, or cyanosis noted on face or hands.     Assessment and Plan: Symptoms most consistent with constipation.  Recommended MiraLAX Recommended if symptoms continue or worsen she will need to go to the ER for CT scan.  Follow Up Instructions: Go to the ER for any worsening abdominal pain    I discussed the assessment and treatment plan with the patient. The patient was provided an opportunity to ask questions and all were answered. The patient agreed with the plan and demonstrated an understanding of the instructions.   The patient was advised to call back or seek an in-person evaluation if the symptoms worsen or if the condition fails to improve as anticipated.     Orvan July, NP  09/03/2019 10:56 AM         Orvan July, NP 09/03/19 1056

## 2019-09-02 NOTE — Discharge Instructions (Signed)
Believe this is constipation MiraLAX for the next few days to see if you have a good bowel movement.  If your pain worsens you will need to go to the ER.

## 2019-09-02 NOTE — Discharge Instructions (Addendum)
°  Be sure to stay well hydrated. You may try taking Colace and Miralax or an over the counter laxative such as milk of magnesium (ask pharmacist for assistance) to help with your ongoing constipation. You may take 600mg  ibuprofen every 6-8 hours for the pain and inflammation along with acetaminophen 500mg  every 4-6 hours for pain.   Try to schedule a follow up appointment with your primary care provider tomorrow for recheck of symptoms or by Monday.  Call 911 or go to the hospital if symptoms worsening this evening or this weekend, worsening abdominal pain, worsening bloating, fever, vomiting, difficulty emptying your bladder, or other new concerning symptoms develop.

## 2019-09-02 NOTE — ED Triage Notes (Signed)
Abdominal pain, gassy, sharp, high and low, urine is restricted x 5 days. Had 3 BM's yesterday.

## 2019-09-03 ENCOUNTER — Telehealth: Payer: Self-pay | Admitting: Emergency Medicine

## 2019-09-03 LAB — COMPLETE METABOLIC PANEL WITH GFR
AG Ratio: 1.6 (calc) (ref 1.0–2.5)
ALT: 13 U/L (ref 6–29)
AST: 12 U/L (ref 10–35)
Albumin: 4.2 g/dL (ref 3.6–5.1)
Alkaline phosphatase (APISO): 78 U/L (ref 31–125)
BUN: 17 mg/dL (ref 7–25)
CO2: 26 mmol/L (ref 20–32)
Calcium: 9.1 mg/dL (ref 8.6–10.2)
Chloride: 105 mmol/L (ref 98–110)
Creat: 0.94 mg/dL (ref 0.50–1.10)
GFR, Est African American: 83 mL/min/{1.73_m2} (ref >=60)
GFR, Est Non African American: 72 mL/min/{1.73_m2} (ref >=60)
Globulin: 2.7 g/dL (calc) (ref 1.9–3.7)
Glucose, Bld: 97 mg/dL (ref 65–99)
Potassium: 4 mmol/L (ref 3.5–5.3)
Sodium: 139 mmol/L (ref 135–146)
Total Bilirubin: 0.3 mg/dL (ref 0.2–1.2)
Total Protein: 6.9 g/dL (ref 6.1–8.1)

## 2019-09-03 NOTE — Telephone Encounter (Signed)
Pain has improved slightly, no large BM yet but also no new symptom- no fever or vomiting. Encouraged to call back if needed, ER if worsening.

## 2019-09-06 ENCOUNTER — Ambulatory Visit: Payer: Self-pay | Admitting: Allergy & Immunology

## 2019-09-08 ENCOUNTER — Telehealth: Payer: Self-pay

## 2019-09-08 ENCOUNTER — Institutional Professional Consult (permissible substitution): Payer: Self-pay | Admitting: Neurology

## 2019-09-08 NOTE — Telephone Encounter (Signed)
Attempted to reach the pt to discuss rescheduling her 09/08/2019 appt due to the provider not feeling well.  Pt was not available and vm was not working. Will fwd message to new pt referrals so they can f/u on getting pt rescheduled.

## 2019-09-13 ENCOUNTER — Ambulatory Visit: Payer: No Typology Code available for payment source | Admitting: Medical

## 2019-10-12 ENCOUNTER — Ambulatory Visit (HOSPITAL_BASED_OUTPATIENT_CLINIC_OR_DEPARTMENT_OTHER)
Admission: RE | Admit: 2019-10-12 | Discharge: 2019-10-12 | Disposition: A | Discharge status: Home / Self Care | Payer: No Typology Code available for payment source | Source: Ambulatory Visit | Attending: Obstetrics and Gynecology | Admitting: Obstetrics and Gynecology

## 2019-10-12 ENCOUNTER — Encounter (HOSPITAL_BASED_OUTPATIENT_CLINIC_OR_DEPARTMENT_OTHER): Payer: Self-pay

## 2019-10-12 ENCOUNTER — Other Ambulatory Visit: Payer: Self-pay

## 2019-10-12 DIAGNOSIS — Z1231 Encounter for screening mammogram for malignant neoplasm of breast: Secondary | ICD-10-CM | POA: Diagnosis present

## 2019-10-29 ENCOUNTER — Telehealth: Payer: Self-pay | Admitting: Allergy & Immunology

## 2019-10-29 DIAGNOSIS — Z20822 Contact with and (suspected) exposure to covid-19: Secondary | ICD-10-CM

## 2019-10-29 NOTE — Telephone Encounter (Signed)
Patient has been around a positive contact. Testing ordered for screening purposes.   Joanne Marvel, MD [facility]

## 2020-02-18 ENCOUNTER — Other Ambulatory Visit: Payer: Self-pay | Admitting: *Deleted

## 2020-02-18 DIAGNOSIS — Z20822 Contact with and (suspected) exposure to covid-19: Secondary | ICD-10-CM

## 2020-02-19 LAB — NOVEL CORONAVIRUS, NAA: SARS-CoV-2, NAA: NOT DETECTED

## 2020-02-19 LAB — SARS-COV-2, NAA 2 DAY TAT

## 2020-06-09 ENCOUNTER — Ambulatory Visit: Payer: No Typology Code available for payment source | Admitting: Medical

## 2020-06-26 ENCOUNTER — Encounter: Payer: Self-pay | Admitting: Internal Medicine

## 2020-06-26 ENCOUNTER — Other Ambulatory Visit: Payer: Self-pay

## 2020-06-26 ENCOUNTER — Ambulatory Visit (INDEPENDENT_AMBULATORY_CARE_PROVIDER_SITE_OTHER): Payer: No Typology Code available for payment source | Admitting: Internal Medicine

## 2020-06-26 VITALS — BP 131/81 | HR 78 | Temp 97.7°F | Ht 67.0 in | Wt 205.0 lb

## 2020-06-26 DIAGNOSIS — M25572 Pain in left ankle and joints of left foot: Secondary | ICD-10-CM | POA: Diagnosis not present

## 2020-06-26 NOTE — Progress Notes (Unsigned)
Subjective:    Patient ID: Joanne Wilson, female    DOB: 1970-04-05, 50 y.o.   MRN: [id number]  DOS:  06/26/2020 Type of visit - description: acute For a while is having problems with pain at the left ankle, mostly at the inner aspect. 3 days ago developed more than usual swelling at that area, does not recall any redness or warmness. No fever chills No actual injury but she does work on her feet all day.  Has a history of chronic edema.  Has pain at the right Achilles tendon for few weeks but sxs are currently better  Review of Systems See above   Past Medical History:  Diagnosis Date  . Carpal tunnel syndrome   . Family history of breast cancer   . Finger fracture, left    5th digit (small finger)  . GERD (gastroesophageal reflux disease)   . PONV (postoperative nausea and vomiting)   . PONV (postoperative nausea and vomiting) 08/10/2019  . Varicose vein of leg     Past Surgical History:  Procedure Laterality Date  . AUGMENTATION MAMMAPLASTY Bilateral 2014  . LAPAROSCOPIC VAGINAL HYSTERECTOMY WITH SALPINGECTOMY Bilateral 02/25/2018   Procedure: LAPAROSCOPIC ASSISTED VAGINAL HYSTERECTOMY WITH SALPINGECTOMY;  Surgeon: [name], MD;  Location: [facility];  Service: Gynecology;  Laterality: Bilateral;  . OPEN REDUCTION INTERNAL FIXATION (ORIF) METACARPAL Left 07/10/2017   Procedure: OPEN REDUCTION INTERNAL FIXATION (ORIF) METACARPAL;  Surgeon: Iran Planas, MD;  Location: [facility];  Service: Orthopedics;  Laterality: Left;  . TUBAL LIGATION Bilateral 2005   PPTL  . VARICOSE VEIN SURGERY Bilateral    surgery years ago and sclerothraphy ~ 2015    Allergies as of 06/26/2020   No Known Allergies     Medication List       Accurate as of June 26, 2020 11:59 PM. If you have any questions, ask your nurse or doctor.        ibuprofen 400 MG tablet Commonly known as: ADVIL Take 400 mg by mouth every 6 (six) hours as needed.   multivitamin with  minerals Tabs tablet Take 1 tablet by mouth daily.   OMEGA-3 COMPLEX PO Take by mouth. OMEGA XL   OVER THE COUNTER MEDICATION Take 2 tablets by mouth daily. Omega 7 otc supplement   sertraline 50 MG tablet Commonly known as: ZOLOFT Take 25 mg by mouth daily as needed (for sweating).   THRIVE FOR LIFE WOMENS PO Take by mouth.   Vitamin D (Ergocalciferol) 1.25 MG (50000 UNIT) Caps capsule Commonly known as: DRISDOL Take 1 capsule (50,000 Units total) by mouth every 7 (seven) days.   Vitamin D 50 MCG (2000 UT) Caps Take by mouth.          Objective:   Physical Exam Musculoskeletal:       Feet:    BP 131/81 (BP Location: Right Arm, Patient Position: Sitting, Cuff Size: Large)   Pulse 78   Temp 97.7 F (36.5 C) (Oral)   Ht 5\' 7"  (1.702 m)   Wt 205 lb (93 kg)   LMP 02/13/2018   SpO2 98%   BMI 32.11 kg/m  General:   Well developed, NAD, BMI noted. HEENT:  Normocephalic . Face symmetric, atraumatic Lower extremities: No edema, well perfused toes. Ankles: Symmetric, currently with no swelling, redness or deformities.  Mild tenderness to palpation at the left inner ankle. Skin: Not pale. Not jaundice Neurologic:  alert & oriented X3.  Speech normal, gait appropriate for age and unassisted  Psych--  Cognition and judgment appear intact.  Cooperative with normal attention span and concentration.  Behavior appropriate. No anxious or depressed appearing.      Assessment      Assessment (new patient 07/2019) GERD Vit D def  Laparoscopic vaginal hysterectomy with salpingectomy (no oophorectomy), d/t fibroids, DUB ? Thalassemia Varicose veins surgery remotely, sclerotherapy ~ 2015 ~ 2010; has chronic edema LE Flat feet  PLAN Left ankle pain: Going on for a while, had swelling 3 days ago but that is resolved.  Low suspicious for gout (no warmness or redness).  Request further evaluation, refer to Ortho, okay to use ankle support wrap. Lower extremity edema:  Today she reports a history of varicose vein surgeries and  sclerotherapy, has chronic edema, currently well controlled with compression stockings.  This visit occurred during the SARS-CoV-2 public health emergency.  Safety protocols were in place, including screening questions prior to the visit, additional usage of staff PPE, and extensive cleaning of exam room while observing appropriate contact time as indicated for disinfecting solutions.

## 2020-06-26 NOTE — Patient Instructions (Signed)
We are referring you to the orthopedic group for the left ankle pain

## 2020-06-27 DIAGNOSIS — Z09 Encounter for follow-up examination after completed treatment for conditions other than malignant neoplasm: Secondary | ICD-10-CM | POA: Insufficient documentation

## 2020-06-27 NOTE — Assessment & Plan Note (Signed)
Left ankle pain: Going on for a while, had swelling 3 days ago but that is resolved.  Low suspicious for gout (no warmness or redness).  Request further evaluation, refer to Ortho, okay to use ankle support wrap. Lower extremity edema: Today she reports a history of varicose vein surgeries and  sclerotherapy, has chronic edema, currently well controlled with compression stockings.

## 2020-06-29 ENCOUNTER — Ambulatory Visit (INDEPENDENT_AMBULATORY_CARE_PROVIDER_SITE_OTHER): Payer: No Typology Code available for payment source

## 2020-06-29 ENCOUNTER — Ambulatory Visit (INDEPENDENT_AMBULATORY_CARE_PROVIDER_SITE_OTHER): Payer: No Typology Code available for payment source | Admitting: Orthopedic Surgery

## 2020-06-29 ENCOUNTER — Encounter: Payer: Self-pay | Admitting: Orthopedic Surgery

## 2020-06-29 DIAGNOSIS — M79672 Pain in left foot: Secondary | ICD-10-CM

## 2020-06-29 NOTE — Progress Notes (Signed)
Office Visit Note   Patient: Joanne Wilson           Date of Birth: 1971-01-03           MRN: [id number] Visit Date: 06/29/2020              Requested by: Colon Branch, [address] [place],  [place] [id number] PCP: Colon Branch, MD  Chief Complaint  Patient presents with  . 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  She presents today with a chief complaint of left medial foot pain.  She denies any injuries but admits she is quite flat-footed.  She has been treating this with wrapping and recently brought a pair of Hoka sneakers which seem to help a little bit.  Assessment & Plan: Visit Diagnoses:  1. Pain in left foot     Plan: Findings consistent with insertional posterior tibial tendinitis.  I discussed the natural history of this with her.  We discussed that her Hoka's are quite good but it would need some arch support such as a sole orthotics.  Also talked about putting a little Voltaren gel in the area of inflammation.  For now we would like for her to go in to a posterior tibial tendon brace.  Follow-up in 1 month for reexam valuation.  Follow-Up Instructions: No follow-ups on file.   Ortho Exam  Patient is alert, oriented, no adenopathy, well-dressed, normal affect, normal respiratory effort.  Examination of her left foot demonstrates no redness no cellulitis no swelling.  She has a strong dorsalis pedis pulse.  She does have some Achilles contracture.  No tenderness over the posterior plantar surface of her heel.  She is focally tender over the insertion of the posterior tibial tendon into the navicular.  She has exacerbation of her pain with resisted inversion.  No lateral impingement findings.  She has bilateral pes planus no ankle pain.    Imaging: XR Foot 2 Views Left  Result Date: 06/29/2020 2 views of her left foot demonstrate well-maintained alignment.  She  does have bone spurs at the posterior aspect of the calcaneus and on the plantar surface of the calcaneus consistent with some insertional Achilles tendinitis and plantar fascitis.  Otherwise no  osseous abnormalities  No images are attached to the encounter.  Labs: Lab Results  Component Value Date   HGBA1C 5.9 08/20/2019     Lab Results  Component Value Date   ALBUMIN 4.3 08/20/2019    No results found for: MG Lab Results  Component Value Date   VD25OH 20.98 (L) 08/20/2019   VD25OH 21.1 (L) 06/15/2019    No results found for: PREALBUMIN CBC EXTENDED Latest Ref Rng & Units 08/20/2019 02/25/2018 02/13/2018  WBC 4.0 - 10.5 K/uL 4.6 5.8 5.1  RBC 3.87 - 5.11 Mil/uL 4.73 4.43 4.52  HGB 12.0 - 15.0 g/dL 12.0 11.3(L) 11.5(L)  HCT 36.0 - 46.0 % 37.3 36.7 37.7  PLT 150.0 - 400.0 K/uL 247.0 302 282  NEUTROABS 1.4 - 7.7 K/uL 2.5 - -  LYMPHSABS 0.7 - 4.0 K/uL 1.6 - -     There is no height or weight on file to calculate BMI.  Orders:  Orders Placed This Encounter  Procedures  . XR Foot 2 Views Left   No orders of the defined types were placed in this encounter.  Procedures: No procedures performed  Clinical Data: No additional findings.  ROS:  All other systems negative, except as noted in the HPI. Review of Systems  Objective: Vital Signs: LMP 02/13/2018   Specialty Comments:  No specialty comments available.  PMFS History: Patient Active Problem List   Diagnosis Date Noted  . PCP NOTES  >>>>>>> 06/27/2020  . Annual physical exam 08/20/2019  . Radiculopathy 08/10/2019  . Carpal tunnel syndrome, left 08/10/2019  . GERD (gastroesophageal reflux disease) 08/10/2019  . PONV (postoperative nausea and vomiting) 08/10/2019  . Fibroids 02/25/2018  . S/P laparoscopic assisted vaginal hysterectomy (LAVH) 02/25/2018  . Metacarpal bone fracture 06/16/2017  . Family history of breast cancer    Past Medical History:  Diagnosis Date  . Carpal tunnel syndrome   .  Family history of breast cancer   . Finger fracture, left    5th digit (small finger)  . GERD (gastroesophageal reflux disease)   . PONV (postoperative nausea and vomiting)   . PONV (postoperative nausea and vomiting) 08/10/2019  . Varicose vein of leg     Family History  Problem Relation Age of Onset  . Breast cancer Mother 31  . Heart failure Father   . Hypertension Father   . Yves Dill Parkinson White syndrome Brother   . Breast cancer Maternal Grandmother 78  . Breast cancer Other   . Breast cancer Cousin        mother's maternal first cousin dx 2x under 55  . Colon cancer Neg Hx     Past Surgical History:  Procedure Laterality Date  . AUGMENTATION MAMMAPLASTY Bilateral 2014  . LAPAROSCOPIC VAGINAL HYSTERECTOMY WITH SALPINGECTOMY Bilateral 02/25/2018   Procedure: LAPAROSCOPIC ASSISTED VAGINAL HYSTERECTOMY WITH SALPINGECTOMY;  Surgeon: [name], MD;  Location: [facility];  Service: Gynecology;  Laterality: Bilateral;  . OPEN REDUCTION INTERNAL FIXATION (ORIF) METACARPAL Left 07/10/2017   Procedure: OPEN REDUCTION INTERNAL FIXATION (ORIF) METACARPAL;  Surgeon: Iran Planas, MD;  Location: [facility];  Service: Orthopedics;  Laterality: Left;  . TUBAL LIGATION Bilateral 2005   PPTL  . VARICOSE VEIN SURGERY Bilateral    surgery years ago and sclerothraphy ~ 2015   Social History   Occupational History  . Occupation: CMA, [employer]   Tobacco Use  . Smoking status: Never Smoker  . Smokeless tobacco: Never Used  Vaping Use  . Vaping Use: Never used  Substance and Sexual Activity  . Alcohol use: No  . Drug use: No  . Sexual activity: Not on file

## 2020-07-28 ENCOUNTER — Ambulatory Visit: Payer: No Typology Code available for payment source | Admitting: Physician Assistant

## 2020-08-03 ENCOUNTER — Ambulatory Visit: Payer: No Typology Code available for payment source | Admitting: Orthopedic Surgery

## 2020-08-04 ENCOUNTER — Ambulatory Visit: Payer: No Typology Code available for payment source | Admitting: Physician Assistant

## 2020-08-11 IMAGING — MG DIGITAL SCREENING BILATERAL MAMMOGRAM WITH IMPLANTS, CAD AND TOM
8 of 12 series · 8 of 28 positions shown · non-contrast
Comparison: Previous exam(s).

CLINICAL DATA: Screening.

EXAM:
DIGITAL SCREENING BILATERAL MAMMOGRAM WITH IMPLANTS, CAD AND TOMO
The patient has bilateral retroglandular implants. Standard and
implant displaced views were performed.

[R CC]
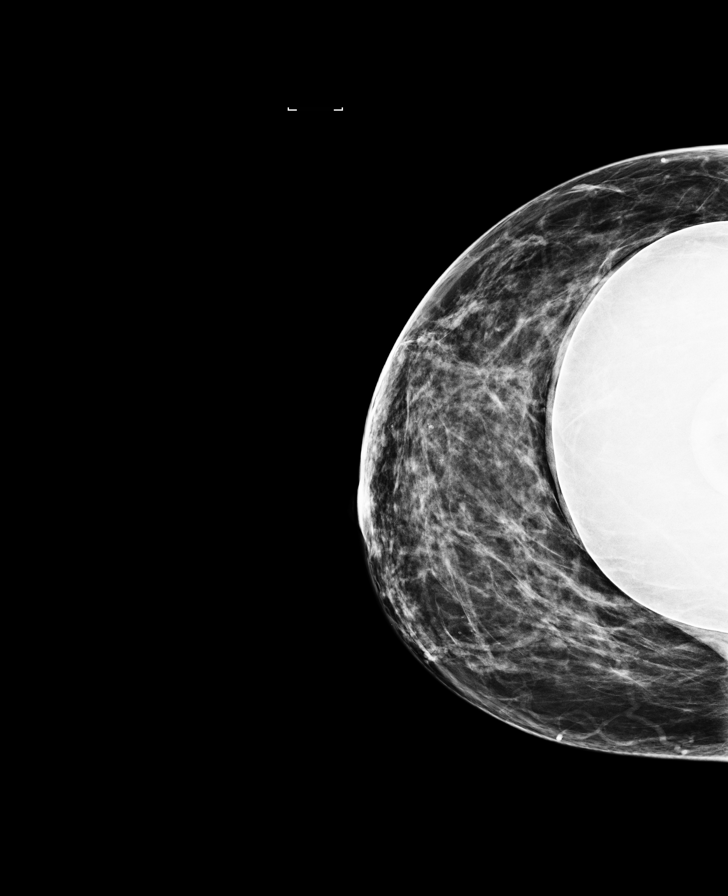

[R MLO]
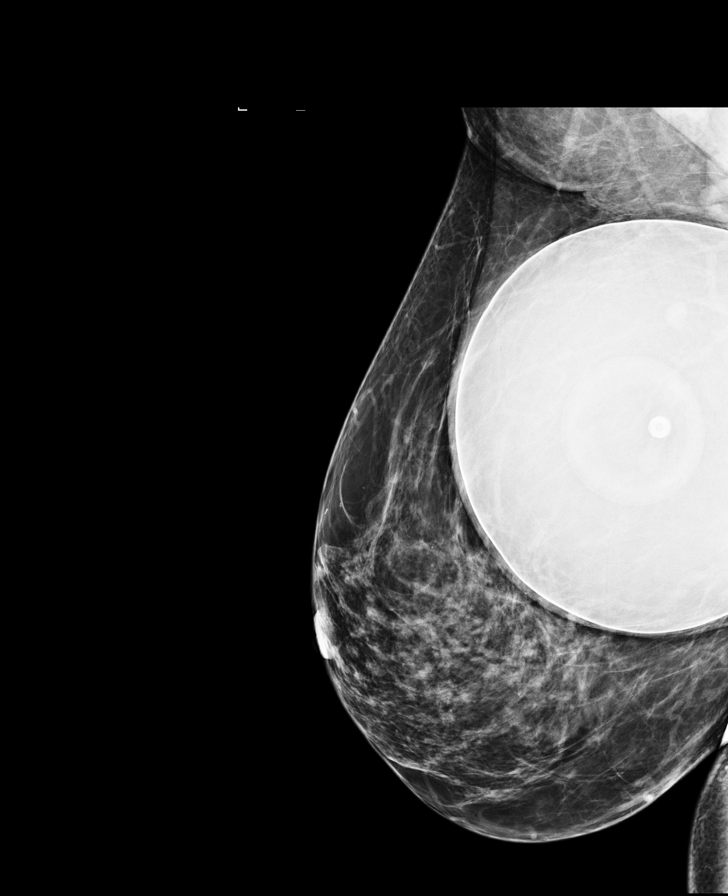

[L MLO]
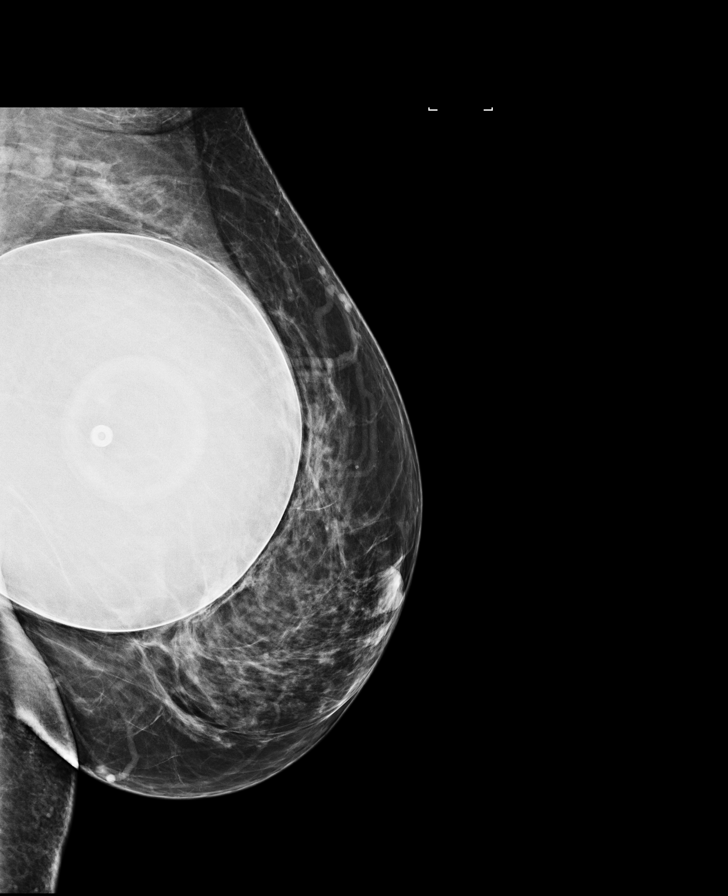

[L CC]
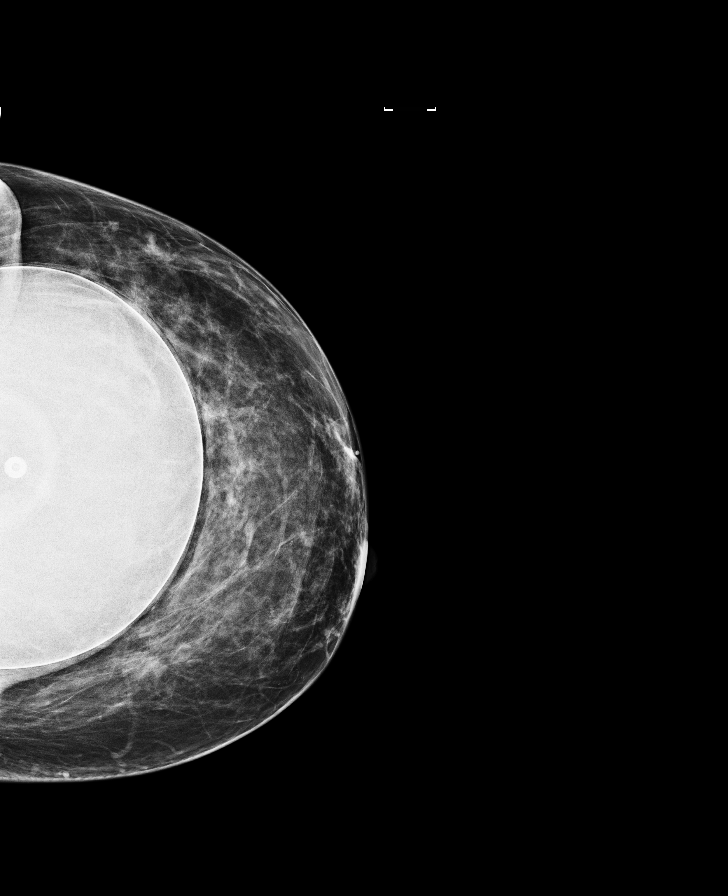

[R MLO synth-2D]
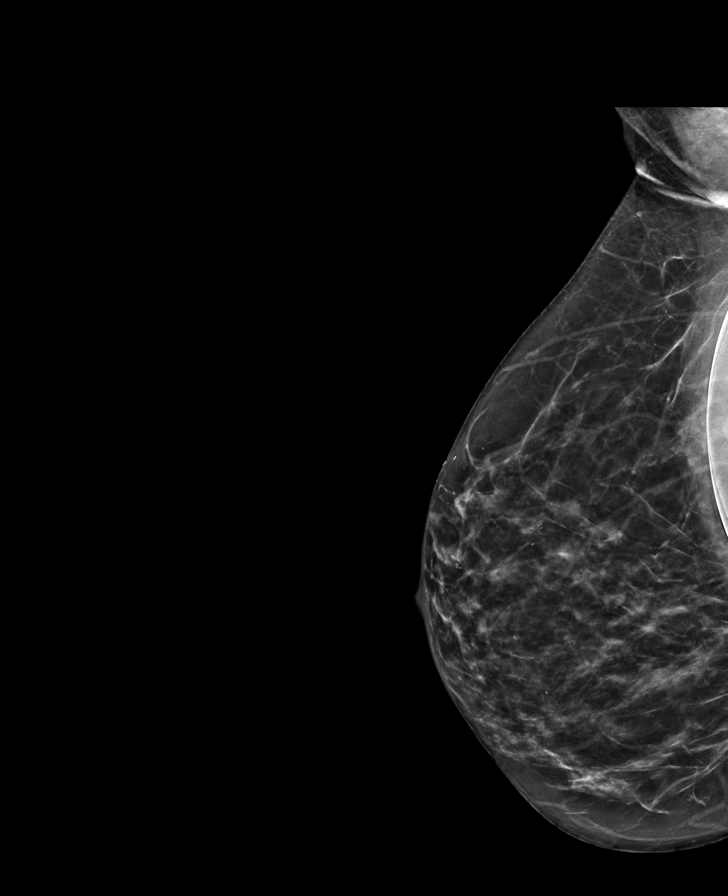

[L MLO synth-2D]
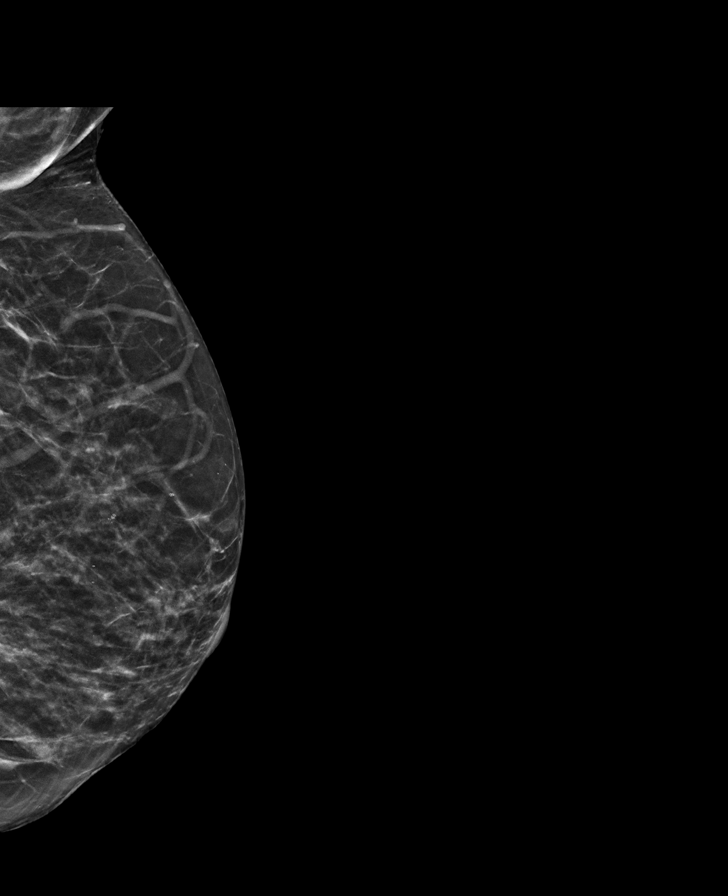

[R CC synth-2D]
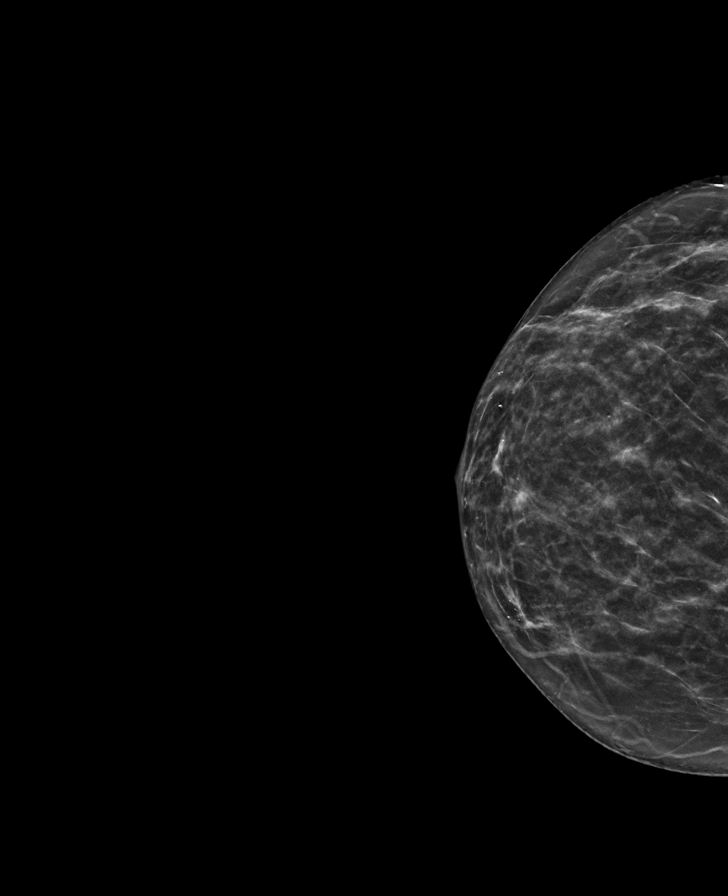

[L CC synth-2D]
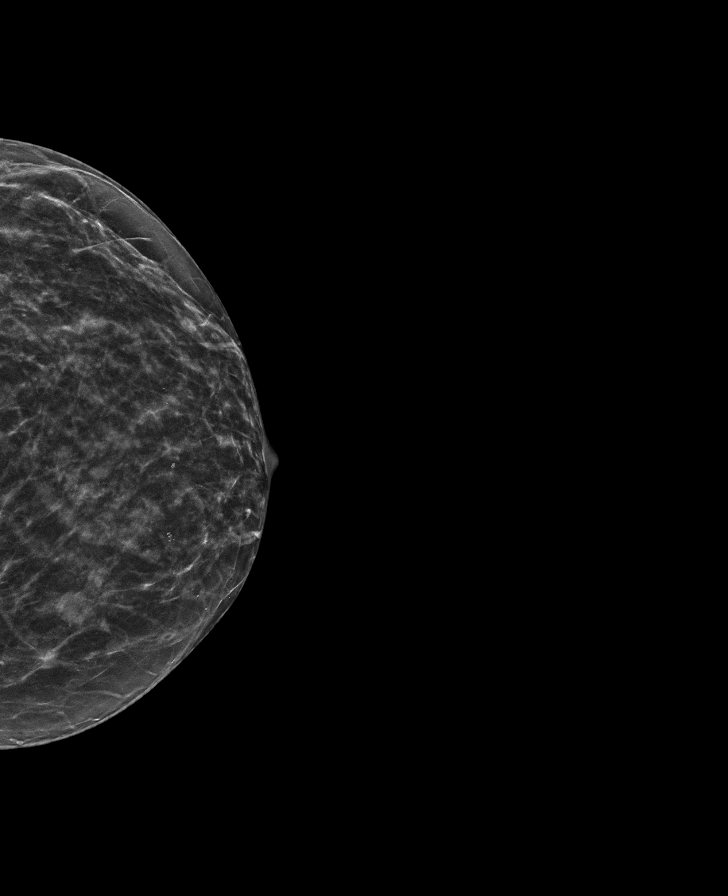

[8 of 28 positions shown; findings below may reference images not displayed]

ACR Breast Density Category c: The breast tissue is heterogeneously
dense, which may obscure small masses.
FINDINGS: In the right breast, possible distortion warrants further evaluation
with spot compression views and possibly ultrasound. In the left
breast, no suspicious masses or malignant type calcifications are
identified.

Images were processed with CAD.
IMPRESSION: Further evaluation is suggested for possible distortion in the right
breast.

RECOMMENDATION:
Diagnostic mammogram and possibly ultrasound of the right breast.
(Code:[id number])

The patient will be contacted regarding the findings, and additional
imaging will be scheduled.

BI-RADS CATEGORY  0: Incomplete. Need additional imaging evaluation
and/or prior mammograms for comparison.

## 2020-08-25 ENCOUNTER — Encounter: Payer: No Typology Code available for payment source | Admitting: Internal Medicine

## 2020-08-29 ENCOUNTER — Encounter: Payer: Self-pay | Admitting: Internal Medicine

## 2020-09-08 ENCOUNTER — Other Ambulatory Visit (HOSPITAL_BASED_OUTPATIENT_CLINIC_OR_DEPARTMENT_OTHER): Payer: Self-pay | Admitting: Internal Medicine

## 2020-09-08 DIAGNOSIS — Z1231 Encounter for screening mammogram for malignant neoplasm of breast: Secondary | ICD-10-CM

## 2020-10-16 ENCOUNTER — Ambulatory Visit (HOSPITAL_BASED_OUTPATIENT_CLINIC_OR_DEPARTMENT_OTHER): Payer: No Typology Code available for payment source

## 2020-12-05 ENCOUNTER — Encounter (HOSPITAL_BASED_OUTPATIENT_CLINIC_OR_DEPARTMENT_OTHER): Payer: Self-pay

## 2020-12-05 ENCOUNTER — Other Ambulatory Visit: Payer: Self-pay

## 2020-12-05 ENCOUNTER — Ambulatory Visit (HOSPITAL_BASED_OUTPATIENT_CLINIC_OR_DEPARTMENT_OTHER)
Admission: RE | Admit: 2020-12-05 | Discharge: 2020-12-05 | Disposition: A | Discharge status: Home / Self Care | Payer: No Typology Code available for payment source | Source: Ambulatory Visit | Attending: Internal Medicine | Admitting: Internal Medicine

## 2020-12-05 DIAGNOSIS — Z1231 Encounter for screening mammogram for malignant neoplasm of breast: Secondary | ICD-10-CM | POA: Insufficient documentation

## 2021-04-24 ENCOUNTER — Telehealth: Payer: Self-pay | Admitting: Allergy & Immunology

## 2021-04-24 ENCOUNTER — Other Ambulatory Visit (HOSPITAL_BASED_OUTPATIENT_CLINIC_OR_DEPARTMENT_OTHER): Payer: Self-pay

## 2021-04-24 MED ORDER — NIRMATRELVIR/RITONAVIR (PAXLOVID)TABLET
3.0000 | ORAL_TABLET | Freq: Two times a day (BID) | ORAL | 0 refills | Status: AC
  Filled 2021-04-24: qty 30, 5d supply, fill #0

## 2021-04-24 MED ORDER — ALBUTEROL SULFATE HFA 108 (90 BASE) MCG/ACT IN AERS
2.0000 | INHALATION_SPRAY | Freq: Four times a day (QID) | RESPIRATORY_TRACT | 2 refills | Status: DC | PRN
  Filled 2021-04-24: qty 18, 25d supply, fill #0

## 2021-04-24 NOTE — Telephone Encounter (Signed)
Patient contracted [facility]. Send in Paxlovid and albuterol. Patient aware.  Salvatore Marvel, MD [facility]

## 2021-05-31 ENCOUNTER — Encounter: Payer: Self-pay | Admitting: Internal Medicine

## 2021-07-13 ENCOUNTER — Other Ambulatory Visit (HOSPITAL_BASED_OUTPATIENT_CLINIC_OR_DEPARTMENT_OTHER): Payer: Self-pay

## 2021-07-13 ENCOUNTER — Telehealth: Payer: Self-pay | Admitting: Allergy & Immunology

## 2021-07-13 MED ORDER — AMOXICILLIN-POT CLAVULANATE 875-125 MG PO TABS
1.0000 | ORAL_TABLET | Freq: Two times a day (BID) | ORAL | 0 refills | Status: AC
  Filled 2021-07-13: qty 20, 10d supply, fill #0

## 2021-07-13 NOTE — Telephone Encounter (Signed)
Patient has been coughing with sinus pressure and postnasal drip for over two weeks. She has been afebrile and has tested negative for COVID19. I put her on a short burst of prednisone, but given the time course I think that antibiotics are warranted.  ? ?Salvatore Marvel, MD ?[facility] ? ?

## 2021-09-03 ENCOUNTER — Ambulatory Visit: Payer: No Typology Code available for payment source | Admitting: Podiatry

## 2021-10-04 ENCOUNTER — Ambulatory Visit: Payer: No Typology Code available for payment source | Admitting: Podiatry

## 2021-10-10 ENCOUNTER — Ambulatory Visit: Payer: No Typology Code available for payment source

## 2021-10-10 ENCOUNTER — Ambulatory Visit (INDEPENDENT_AMBULATORY_CARE_PROVIDER_SITE_OTHER): Payer: No Typology Code available for payment source | Admitting: Podiatry

## 2021-10-10 DIAGNOSIS — M7662 Achilles tendinitis, left leg: Secondary | ICD-10-CM | POA: Diagnosis not present

## 2021-10-10 DIAGNOSIS — M7661 Achilles tendinitis, right leg: Secondary | ICD-10-CM

## 2021-10-17 NOTE — Progress Notes (Signed)
  Subjective:  Patient ID: Joanne Wilson, female    DOB: 1970/09/24,  MRN: [id number]  No chief complaint on file.   51 y.o. female presents with the above complaint.  Patient presents with complaint of left Achilles tendon insertional pain.  Patient states painful to touch is trouble with walking pain is worse with walking.  Has trouble with rolling.  Pain scale 7 out of 10.   Review of Systems: Negative except as noted in the HPI. Denies N/V/F/Ch.  Past Medical History:  Diagnosis Date   Carpal tunnel syndrome    Family history of breast cancer    Finger fracture, left    5th digit (small finger)   GERD (gastroesophageal reflux disease)    PONV (postoperative nausea and vomiting)    PONV (postoperative nausea and vomiting) 08/10/2019   Varicose vein of leg     Current Outpatient Medications:    albuterol (VENTOLIN HFA) 108 (90 Base) MCG/ACT inhaler, Inhale 2 puffs by mouth into the lungs every 6 (six) hours as needed for wheezing or shortness of breath., Disp: 18 g, Rfl: 2   Cholecalciferol (VITAMIN D) 50 MCG (2000 UT) CAPS, Take by mouth., Disp: , Rfl:    ibuprofen (ADVIL) 400 MG tablet, Take 400 mg by mouth every 6 (six) hours as needed. (Patient not taking: Reported on 06/26/2020), Disp: , Rfl:    Multiple Vitamin (MULTIVITAMIN WITH MINERALS) TABS tablet, Take 1 tablet by mouth daily., Disp: , Rfl:    OVER THE COUNTER MEDICATION, Take 2 tablets by mouth daily. Omega 7 otc supplement, Disp: , Rfl:   Social History   Tobacco Use  Smoking Status Never  Smokeless Tobacco Never    No Known Allergies Objective:  There were no vitals filed for this visit. There is no height or weight on file to calculate BMI. Constitutional Well developed. Well nourished.  Vascular Dorsalis pedis pulses palpable bilaterally. Posterior tibial pulses palpable bilaterally. Capillary refill normal to all digits.  No cyanosis or clubbing noted. Pedal hair growth normal.  Neurologic Normal  speech. Oriented to person, place, and time. Epicritic sensation to light touch grossly present bilaterally.  Dermatologic Nails well groomed and normal in appearance. No open wounds. No skin lesions.  Orthopedic: Pain on palpation of left Achilles tendon insertion pain with range of motion.  Pain with dorsiflexion of the ankle joint no pain with plantarflexion of the ankle joint pain at the insertion of the Achilles tendon positive Silfverskiold test noted gastrocnemius equinus clinically unable to appreciate the Haglund's deformity.   Radiographs: 3 views of skeletally mature adult bilateral foot.  Posterior spurring noted with plantar spurring.  Pes planovalgus deformity noted midfoot arthritis noted.  Haglund deformity noted Assessment:   1. Left Achilles tendinitis    Plan:  Patient was evaluated and treated and all questions answered.  Left Achilles tendinitis greater than right Achilles tendinitis -All questions and concerns were discussed with the patient in extensive detail.  Given the amount of pain that she is having she will benefit from cam boot immobilization. -Cam boot was dispensed. -If there is no relief patient will benefit from steroid injection versus MRI evaluation.  No follow-ups on file.

## 2021-10-19 ENCOUNTER — Encounter: Payer: No Typology Code available for payment source | Admitting: Internal Medicine

## 2021-10-29 ENCOUNTER — Encounter: Payer: No Typology Code available for payment source | Admitting: Internal Medicine

## 2021-11-09 ENCOUNTER — Ambulatory Visit (INDEPENDENT_AMBULATORY_CARE_PROVIDER_SITE_OTHER): Payer: No Typology Code available for payment source | Admitting: Podiatry

## 2021-11-09 DIAGNOSIS — M7662 Achilles tendinitis, left leg: Secondary | ICD-10-CM | POA: Diagnosis not present

## 2021-11-16 NOTE — Progress Notes (Signed)
Subjective:  Patient ID: Joanne Wilson, female    DOB: 11-03-1970,  MRN: [id number]  Chief Complaint  Patient presents with   Foot Pain    Pt stated that she is about the same     51 y.o. female presents with the above complaint.  Patient presents with complaint of left Achilles tendon insertional pain.  She is states cam boot immobilization helped some but the pain is about the same.  She would like to discuss next treatment plan.  She denies any other acute complaints   Review of Systems: Negative except as noted in the HPI. Denies N/V/F/Ch.  Past Medical History:  Diagnosis Date   Carpal tunnel syndrome    Family history of breast cancer    Finger fracture, left    5th digit (small finger)   GERD (gastroesophageal reflux disease)    PONV (postoperative nausea and vomiting)    PONV (postoperative nausea and vomiting) 08/10/2019   Varicose vein of leg     Current Outpatient Medications:    albuterol (VENTOLIN HFA) 108 (90 Base) MCG/ACT inhaler, Inhale 2 puffs by mouth into the lungs every 6 (six) hours as needed for wheezing or shortness of breath., Disp: 18 g, Rfl: 2   Cholecalciferol (VITAMIN D) 50 MCG (2000 UT) CAPS, Take by mouth., Disp: , Rfl:    ibuprofen (ADVIL) 400 MG tablet, Take 400 mg by mouth every 6 (six) hours as needed. (Patient not taking: Reported on 06/26/2020), Disp: , Rfl:    Multiple Vitamin (MULTIVITAMIN WITH MINERALS) TABS tablet, Take 1 tablet by mouth daily., Disp: , Rfl:    OVER THE COUNTER MEDICATION, Take 2 tablets by mouth daily. Omega 7 otc supplement, Disp: , Rfl:   Social History   Tobacco Use  Smoking Status Never  Smokeless Tobacco Never    No Known Allergies Objective:  There were no vitals filed for this visit. There is no height or weight on file to calculate BMI. Constitutional Well developed. Well nourished.  Vascular Dorsalis pedis pulses palpable bilaterally. Posterior tibial pulses palpable bilaterally. Capillary refill normal  to all digits.  No cyanosis or clubbing noted. Pedal hair growth normal.  Neurologic Normal speech. Oriented to person, place, and time. Epicritic sensation to light touch grossly present bilaterally.  Dermatologic Nails well groomed and normal in appearance. No open wounds. No skin lesions.  Orthopedic: Pain on palpation of left Achilles tendon insertion pain with range of motion.  Pain with dorsiflexion of the ankle joint no pain with plantarflexion of the ankle joint pain at the insertion of the Achilles tendon positive Silfverskiold test noted gastrocnemius equinus clinically unable to appreciate the Haglund's deformity.   Radiographs: 3 views of skeletally mature adult bilateral foot.  Posterior spurring noted with plantar spurring.  Pes planovalgus deformity noted midfoot arthritis noted.  Haglund deformity noted Assessment:   1. Left Achilles tendinitis     Plan:  Patient was evaluated and treated and all questions answered.  Left Achilles tendinitis greater than right Achilles tendinitis -All questions and concerns were discussed with the patient in extensive detail.   -The cam boot immobilization helped considerably.  Given the amount of pain that she still having with some residual pain I believe she will benefit from a steroid injection.  She agrees with the plan like to proceed with steroid injection -I will discuss orthotics management during next clinical visit if there is no improvement.  Versus MRI -I also discuss the risk of rupture associated with it.  She states understanding despite the risks. -A steroid injection was performed at left Kager's fat pad using 1% plain Lidocaine and 10 mg of Kenalog. This was well tolerated.   No follow-ups on file.

## 2021-11-29 ENCOUNTER — Encounter: Payer: Self-pay | Admitting: Internal Medicine

## 2021-12-06 ENCOUNTER — Ambulatory Visit: Payer: No Typology Code available for payment source | Admitting: Podiatry

## 2021-12-20 ENCOUNTER — Encounter: Payer: Self-pay | Admitting: Podiatry

## 2021-12-21 ENCOUNTER — Ambulatory Visit (INDEPENDENT_AMBULATORY_CARE_PROVIDER_SITE_OTHER): Payer: No Typology Code available for payment source | Admitting: Podiatry

## 2021-12-21 DIAGNOSIS — M7662 Achilles tendinitis, left leg: Secondary | ICD-10-CM

## 2021-12-21 NOTE — Progress Notes (Signed)
  Subjective:  Patient ID: Joanne Wilson, female    DOB: February 09, 1971,  MRN: [id number]  Chief Complaint  Patient presents with   Foot Pain    51 y.o. female presents with the above complaint.  Patient presents for follow-up of left Achilles tendon insertional pain.  Patient states that she is doing a lot better.  The injection and immobilization helped considerably.  Denies any other acute complaints   Review of Systems: Negative except as noted in the HPI. Denies N/V/F/Ch.  Past Medical History:  Diagnosis Date   Carpal tunnel syndrome    Family history of breast cancer    Finger fracture, left    5th digit (small finger)   GERD (gastroesophageal reflux disease)    PONV (postoperative nausea and vomiting)    PONV (postoperative nausea and vomiting) 08/10/2019   Varicose vein of leg     Current Outpatient Medications:    albuterol (VENTOLIN HFA) 108 (90 Base) MCG/ACT inhaler, Inhale 2 puffs by mouth into the lungs every 6 (six) hours as needed for wheezing or shortness of breath., Disp: 18 g, Rfl: 2   Cholecalciferol (VITAMIN D) 50 MCG (2000 UT) CAPS, Take by mouth., Disp: , Rfl:    ibuprofen (ADVIL) 400 MG tablet, Take 400 mg by mouth every 6 (six) hours as needed. (Patient not taking: Reported on 06/26/2020), Disp: , Rfl:    Multiple Vitamin (MULTIVITAMIN WITH MINERALS) TABS tablet, Take 1 tablet by mouth daily., Disp: , Rfl:    OVER THE COUNTER MEDICATION, Take 2 tablets by mouth daily. Omega 7 otc supplement, Disp: , Rfl:   Social History   Tobacco Use  Smoking Status Never  Smokeless Tobacco Never    No Known Allergies Objective:  There were no vitals filed for this visit. There is no height or weight on file to calculate BMI. Constitutional Well developed. Well nourished.  Vascular Dorsalis pedis pulses palpable bilaterally. Posterior tibial pulses palpable bilaterally. Capillary refill normal to all digits.  No cyanosis or clubbing noted. Pedal hair growth normal.   Neurologic Normal speech. Oriented to person, place, and time. Epicritic sensation to light touch grossly present bilaterally.  Dermatologic Nails well groomed and normal in appearance. No open wounds. No skin lesions.  Orthopedic: No further pain on palpation of left Achilles tendon insertion no further pain with range of motion.  No further pain with dorsiflexion of the ankle joint no pain with plantarflexion of the ankle joint pain at the insertion of the Achilles tendon positive Silfverskiold test noted gastrocnemius equinus clinically unable to appreciate the Haglund's deformity.   Radiographs: 3 views of skeletally mature adult bilateral foot.  Posterior spurring noted with plantar spurring.  Pes planovalgus deformity noted midfoot arthritis noted.  Haglund deformity noted Assessment:   No diagnosis found.   Plan:  Patient was evaluated and treated and all questions answered.  Left Achilles tendinitis greater than right Achilles tendinitis -All questions and concerns were discussed with the patient in extensive detail.   -Clinically improved considerably.  At this time patient will transition from cam boot to Tri-Lock ankle brace.  Steroid injection also helped as well. -I discussed prevention and orthotics management if any foot and ankle issues arise in the future of asked her to come back and see me.  She states understanding   No follow-ups on file.   Left Achilles tendinitis.  Improving but not completely gone Tri-Lock ankle brace to transition.

## 2022-01-16 ENCOUNTER — Telehealth: Payer: Self-pay | Admitting: Internal Medicine

## 2022-01-16 NOTE — Telephone Encounter (Signed)
error 

## 2022-01-17 ENCOUNTER — Telehealth: Payer: Self-pay | Admitting: Internal Medicine

## 2022-01-17 NOTE — Telephone Encounter (Signed)
That is okay, thank you 

## 2022-01-17 NOTE — Telephone Encounter (Signed)
Pt is wanting to transfer her care from Dr. Larose Kells to [place]. She says our practice is closer to her home and she would like a female provider. She scheduled herself an appointment through mychart. If this is a concern please let me know. Pt @ [phone]

## 2022-02-15 ENCOUNTER — Ambulatory Visit (INDEPENDENT_AMBULATORY_CARE_PROVIDER_SITE_OTHER): Payer: No Typology Code available for payment source | Admitting: Nurse Practitioner

## 2022-02-15 ENCOUNTER — Encounter: Payer: Self-pay | Admitting: Nurse Practitioner

## 2022-02-15 ENCOUNTER — Telehealth: Payer: Self-pay | Admitting: Nurse Practitioner

## 2022-02-15 VITALS — BP 144/84 | HR 95 | Temp 98.1°F | Ht 67.0 in | Wt 211.8 lb

## 2022-02-15 DIAGNOSIS — Z0001 Encounter for general adult medical examination with abnormal findings: Secondary | ICD-10-CM | POA: Diagnosis not present

## 2022-02-15 DIAGNOSIS — Z13220 Encounter for screening for lipoid disorders: Secondary | ICD-10-CM

## 2022-02-15 DIAGNOSIS — Z9882 Breast implant status: Secondary | ICD-10-CM | POA: Insufficient documentation

## 2022-02-15 DIAGNOSIS — Z1211 Encounter for screening for malignant neoplasm of colon: Secondary | ICD-10-CM

## 2022-02-15 DIAGNOSIS — Z8481 Family history of carrier of genetic disease: Secondary | ICD-10-CM | POA: Insufficient documentation

## 2022-02-15 DIAGNOSIS — E559 Vitamin D deficiency, unspecified: Secondary | ICD-10-CM | POA: Diagnosis not present

## 2022-02-15 DIAGNOSIS — Z136 Encounter for screening for cardiovascular disorders: Secondary | ICD-10-CM

## 2022-02-15 DIAGNOSIS — Z1231 Encounter for screening mammogram for malignant neoplasm of breast: Secondary | ICD-10-CM | POA: Diagnosis not present

## 2022-02-15 DIAGNOSIS — Z90710 Acquired absence of both cervix and uterus: Secondary | ICD-10-CM

## 2022-02-15 LAB — COMPREHENSIVE METABOLIC PANEL
ALT: 18 U/L (ref 0–35)
AST: 19 U/L (ref 0–37)
Albumin: 4.3 g/dL (ref 3.5–5.2)
Alkaline Phosphatase: 74 U/L (ref 39–117)
BUN: 12 mg/dL (ref 6–23)
CO2: 26 mEq/L (ref 19–32)
Calcium: 9.1 mg/dL (ref 8.4–10.5)
Chloride: 104 mEq/L (ref 96–112)
Creatinine, Ser: 0.95 mg/dL (ref 0.40–1.20)
GFR: 69.44 mL/min (ref >60.00)
Glucose, Bld: 86 mg/dL (ref 70–99)
Potassium: 3.7 mEq/L (ref 3.5–5.1)
Sodium: 138 mEq/L (ref 135–145)
Total Bilirubin: 0.5 mg/dL (ref 0.2–1.2)
Total Protein: 7.2 g/dL (ref 6.0–8.3)

## 2022-02-15 LAB — CBC WITH DIFFERENTIAL/PLATELET
Basophils Absolute: 0 10*3/uL (ref 0.0–0.1)
Basophils Relative: 0.8 % (ref 0.0–3.0)
Eosinophils Absolute: 0 10*3/uL (ref 0.0–0.7)
Eosinophils Relative: 0.7 % (ref 0.0–5.0)
HCT: 38.6 % (ref 36.0–46.0)
Hemoglobin: 12.2 g/dL (ref 12.0–15.0)
Lymphocytes Relative: 33 % (ref 12.0–46.0)
Lymphs Abs: 1.5 10*3/uL (ref 0.7–4.0)
MCHC: 31.5 g/dL (ref 30.0–36.0)
MCV: 79.2 fl (ref 78.0–100.0)
Monocytes Absolute: 0.5 10*3/uL (ref 0.1–1.0)
Monocytes Relative: 10.9 % (ref 3.0–12.0)
Neutro Abs: 2.6 10*3/uL (ref 1.4–7.7)
Neutrophils Relative %: 54.6 % (ref 43.0–77.0)
Platelets: 254 10*3/uL (ref 150.0–400.0)
RBC: 4.88 Mil/uL (ref 3.87–5.11)
RDW: 14.4 % (ref 11.5–15.5)
WBC: 4.7 10*3/uL (ref 4.0–10.5)

## 2022-02-15 LAB — TSH: TSH: 0.63 u[IU]/mL (ref 0.35–5.50)

## 2022-02-15 LAB — VITAMIN D 25 HYDROXY (VIT D DEFICIENCY, FRACTURES): VITD: 34.5 ng/mL (ref 30.00–100.00)

## 2022-02-15 NOTE — Assessment & Plan Note (Signed)
Ref to genetic counselor

## 2022-02-15 NOTE — Assessment & Plan Note (Signed)
S/p tubal ligation and hysterectomy due to dysmenorrhea, and uterine fibroid Benign pathology. Ovaries still present Previous pap results 2019 and 2017: normal No Fhx of cervical and uterine cancer

## 2022-02-15 NOTE — Telephone Encounter (Signed)
Pt advised via MyChart. 

## 2022-02-15 NOTE — Progress Notes (Signed)
Complete physical exam  Patient: Joanne Wilson   DOB: 12-Jul-1970   51 y.o. Female  MRN: [id number] Visit Date: 02/15/2022  Subjective:    Chief Complaint  Patient presents with   Establish Care    TOC, CPE No concerns  Interested in shingles vaccine      Fletcher D Hoopingarner is a 51 y.o. female who presents today for a complete physical exam. She reports consuming a general diet.  No exercise regimen  She generally feels well. She reports sleeping well. She does not have additional problems to discuss today.  Vision:No Dental:Yes STD Screen:No  Most recent fall risk assessment:    02/15/2022    3:19 PM  [place] in the past year? 0  Number falls in past yr: 0  Injury with Fall? 0  Follow up Falls evaluation completed    Most recent depression screenings:    02/15/2022    1:50 PM 06/26/2020   11:47 AM  PHQ 2/9 Scores  PHQ - 2 Score 0 0  PHQ- 9 Score 4     HPI  Family history of breast cancer gene mutation in first degree relative Ref to genetic counselor  S/P laparoscopic assisted vaginal hysterectomy (LAVH) S/p tubal ligation and hysterectomy due to dysmenorrhea, and uterine fibroid Benign pathology. Ovaries still present Previous pap results 2019 and 2017: normal No Fhx of cervical and uterine cancer   Past Medical History:  Diagnosis Date   Carpal tunnel syndrome    Family history of breast cancer    Finger fracture, left    5th digit (small finger)   GERD (gastroesophageal reflux disease)    PONV (postoperative nausea and vomiting)    PONV (postoperative nausea and vomiting) 08/10/2019   Varicose vein of leg    Past Surgical History:  Procedure Laterality Date   ABDOMINAL HYSTERECTOMY     AUGMENTATION MAMMAPLASTY Bilateral 2014   LAPAROSCOPIC VAGINAL HYSTERECTOMY WITH SALPINGECTOMY Bilateral 02/25/2018   Procedure: LAPAROSCOPIC ASSISTED VAGINAL HYSTERECTOMY WITH SALPINGECTOMY;  Surgeon: [name], MD;  Location: [facility];  Service:  Gynecology;  Laterality: Bilateral;   OPEN REDUCTION INTERNAL FIXATION (ORIF) METACARPAL Left 07/10/2017   Procedure: OPEN REDUCTION INTERNAL FIXATION (ORIF) METACARPAL;  Surgeon: Iran Planas, MD;  Location: [facility];  Service: Orthopedics;  Laterality: Left;   TUBAL LIGATION Bilateral 2005   PPTL   VARICOSE VEIN SURGERY Bilateral    surgery years ago and sclerothraphy ~ 2015   Social History   Socioeconomic History   Marital status: Divorced    Spouse name: Not on file   Number of children: 3   Years of education: Not on file   Highest education level: Not on file  Occupational History   Occupation: [employer], [employer]   Tobacco Use   Smoking status: Never   Smokeless tobacco: Never  Vaping Use   Vaping Use: Never used  Substance and Sexual Activity   Alcohol use: No   Drug use: No   Sexual activity: Not on file  Other Topics Concern   Not on file  Social History Narrative   Household:pt and one of her daughters    Social Determinants of Health   Financial Resource Strain: Not on file  Food Insecurity: Not on file  Transportation Needs: Not on file  Physical Activity: Not on file  Stress: Not on file  Social Connections: Not on file  Intimate Partner Violence: Not on file   Family Status  Relation Name Status   Mother  Alive   Father  Deceased   Sister 13 pat half Alive   Sister 2 pat half twinse Deceased   Brother mat 1/2 Deceased   Brother 2 mat 1/2 Comptroller   Brother 6 pat 1/2 Alive   Mat 14  Alive   Psychiatrist x3 Alive   MGM  Alive   MGF  Deceased   PGM  Deceased   PGF  Deceased   Cousin  Alive   Other 2 mat great aunt Deceased   Neg Hx  (Not Specified)   Family History  Problem Relation Age of Onset   Cancer Mother 63       breast cancer   Breast cancer Mother 32   Hyperlipidemia Father    Kidney disease Father    Hypertension Father    Scientist, research (medical) Parkinson White syndrome Brother    Cancer Maternal Grandmother 50        breast cancer   Breast cancer Maternal Grandmother 31   Breast cancer Cousin        mother's maternal first cousin dx 2x under 12   Breast cancer Other    Colon cancer Neg Hx    No Known Allergies  Patient Care Team: Denzell Colasanti, Charlene Brooke, NP as PCP - General (Internal Medicine) Patient, No Pcp Per (General Practice)   Medications: Outpatient Medications Prior to Visit  Medication Sig   [DISCONTINUED] Multiple Vitamin (MULTIVITAMIN WITH MINERALS) TABS tablet Take 1 tablet by mouth daily.   [DISCONTINUED] albuterol (VENTOLIN HFA) 108 (90 Base) MCG/ACT inhaler Inhale 2 puffs by mouth into the lungs every 6 (six) hours as needed for wheezing or shortness of breath. (Patient not taking: Reported on 02/15/2022)   [DISCONTINUED] Cholecalciferol (VITAMIN D) 50 MCG (2000 UT) CAPS Take by mouth. (Patient not taking: Reported on 02/15/2022)   [DISCONTINUED] ibuprofen (ADVIL) 400 MG tablet Take 400 mg by mouth every 6 (six) hours as needed. (Patient not taking: Reported on 06/26/2020)   [DISCONTINUED] OVER THE COUNTER MEDICATION Take 2 tablets by mouth daily. Omega 7 otc supplement (Patient not taking: Reported on 02/15/2022)   No facility-administered medications prior to visit.    Review of Systems      Objective:  BP (!) 144/84 (BP Location: Right Arm, Patient Position: Sitting, Cuff Size: Normal)   Pulse 95   Temp 98.1 F (36.7 C) (Temporal)   Ht _0  (1.702 m)   Wt 211 lb 12.8 oz (96.1 kg)   LMP 02/13/2018   SpO2 96%   BMI 33.17 kg/m    BP Readings from Last 3 Encounters:  02/15/22 (!) 144/84  06/26/20 131/81  09/02/19 (!) 150/84    Wt Readings from Last 3 Encounters:  02/15/22 211 lb 12.8 oz (96.1 kg)  06/26/20 205 lb (93 kg)  09/02/19 205 lb (93 kg)    Physical Exam Vitals reviewed.  Constitutional:      General: She is not in acute distress.    Appearance: She is obese.  HENT:     Right Ear: Tympanic membrane, ear canal and external ear normal.     Left Ear:  Tympanic membrane, ear canal and external ear normal.     Nose: Nose normal.  Eyes:     General: No scleral icterus.    Extraocular Movements: Extraocular movements intact.     Conjunctiva/sclera: Conjunctivae normal.  Neck:     Thyroid: No thyromegaly.  Cardiovascular:     Rate and Rhythm: Normal rate  and regular rhythm.     Pulses: Normal pulses.     Heart sounds: Normal heart sounds.  Pulmonary:     Effort: Pulmonary effort is normal.     Breath sounds: Normal breath sounds.  Chest:  Breasts:    Breasts are symmetrical.     Right: No mass, nipple discharge or skin change.     Left: No mass, nipple discharge or skin change.  Abdominal:     General: Bowel sounds are normal. There is no distension.     Palpations: Abdomen is soft.     Tenderness: There is no abdominal tenderness.  Genitourinary:    Vagina: Normal. No vaginal discharge.     Cervix: No cervical motion tenderness.     Rectum: Normal.  Musculoskeletal:        General: No tenderness. Normal range of motion.     Cervical back: Normal range of motion and neck supple.  Lymphadenopathy:     Cervical: No cervical adenopathy.     Upper Body:     Right upper body: No supraclavicular, axillary or pectoral adenopathy.     Left upper body: No supraclavicular, axillary or pectoral adenopathy.  Skin:    General: Skin is warm and dry.  Neurological:     Mental Status: She is alert and oriented to person, place, and time.  Psychiatric:        Mood and Affect: Mood normal.        Behavior: Behavior normal.        Thought Content: Thought content normal.        Judgment: Judgment normal.    No results found for any visits on 02/15/22.    Assessment & Plan:    Routine Health Maintenance and Physical Exam  Immunization History  Administered Date(s) Administered   Influenza-Unspecified 01/03/2022   MMR 07/24/1998   Td 07/24/1998   Health Maintenance  Topic Date Due   COLONOSCOPY (Pts 45-88yr Insurance coverage  will need to be confirmed)  Never done   Zoster Vaccines- Shingrix (1 of 2) Never done   MAMMOGRAM  12/05/2021   Hepatitis C Screening  02/16/2023 (Originally 10/02/1988)   HIV Screening  02/16/2023 (Originally 10/02/1985)   INFLUENZA VACCINE  Completed   HPV VACCINES  Aged Out   COVID-19 Vaccine  Discontinued   Discussed health benefits of physical activity, and encouraged her to engage in regular exercise appropriate for her age and condition.  Problem List Items Addressed This Visit       Other   Family history of breast cancer gene mutation in first degree relative    Ref to genetic counselor      Relevant Orders   Ambulatory referral to Genetics   S/P laparoscopic assisted vaginal hysterectomy (LAVH)    S/p tubal ligation and hysterectomy due to dysmenorrhea, and uterine fibroid Benign pathology. Ovaries still present Previous pap results 2019 and 2017: normal No Fhx of cervical and uterine cancer      Other Visit Diagnoses     Encounter for preventative adult health care exam with abnormal findings    -  Primary   Relevant Orders   TSH   Comprehensive metabolic panel   CBC with Differential/Platelet   Encounter for lipid screening for cardiovascular disease       Breast cancer screening by mammogram       Relevant Orders   MM DIGITAL SCREENING W/ IMPLANTS BILATERAL   Vitamin D deficiency       Relevant  Orders   VITAMIN D 25 Hydroxy (Vit-D Deficiency, Fractures)   Colon cancer screening       Relevant Orders   Ambulatory referral to Gastroenterology      Return in about 1 year (around 02/16/2023) for CPE (fasting).     Wilfred Lacy, NP

## 2022-02-15 NOTE — Patient Instructions (Signed)
Go to lab You will be contacted to schedule appt with GI, genetic counselor and for mammogram  Preventive Care 12-51 Years Old, Female Preventive care refers to lifestyle choices and visits with your health care provider that can promote health and wellness. Preventive care visits are also called wellness exams. What can I expect for my preventive care visit? Counseling Your health care provider may ask you questions about your: Medical history, including: Past medical problems. Family medical history. Pregnancy history. Current health, including: Menstrual cycle. Method of birth control. Emotional well-being. Home life and relationship well-being. Sexual activity and sexual health. Lifestyle, including: Alcohol, nicotine or tobacco, and drug use. Access to firearms. Diet, exercise, and sleep habits. Work and work Statistician. Sunscreen use. Safety issues such as seatbelt and bike helmet use. Physical exam Your health care provider will check your: Height and weight. These may be used to calculate your BMI (body mass index). BMI is a measurement that tells if you are at a healthy weight. Waist circumference. This measures the distance around your waistline. This measurement also tells if you are at a healthy weight and may help predict your risk of certain diseases, such as type 2 diabetes and high blood pressure. Heart rate and blood pressure. Body temperature. Skin for abnormal spots. What immunizations do I need?  Vaccines are usually given at various ages, according to a schedule. Your health care provider will recommend vaccines for you based on your age, medical history, and lifestyle or other factors, such as travel or where you work. What tests do I need? Screening Your health care provider may recommend screening tests for certain conditions. This may include: Lipid and cholesterol levels. Diabetes screening. This is done by checking your blood sugar (glucose) after  you have not eaten for a while (fasting). Pelvic exam and Pap test. Hepatitis B test. Hepatitis C test. HIV (human immunodeficiency virus) test. STI (sexually transmitted infection) testing, if you are at risk. Lung cancer screening. Colorectal cancer screening. Mammogram. Talk with your health care provider about when you should start having regular mammograms. This may depend on whether you have a family history of breast cancer. BRCA-related cancer screening. This may be done if you have a family history of breast, ovarian, tubal, or peritoneal cancers. Bone density scan. This is done to screen for osteoporosis. Talk with your health care provider about your test results, treatment options, and if necessary, the need for more tests. Follow these instructions at home: Eating and drinking  Eat a diet that includes fresh fruits and vegetables, whole grains, lean protein, and low-fat dairy products. Take vitamin and mineral supplements as recommended by your health care provider. Do not drink alcohol if: Your health care provider tells you not to drink. You are pregnant, may be pregnant, or are planning to become pregnant. If you drink alcohol: Limit how much you have to 0-1 drink a day. Know how much alcohol is in your drink. In the [place] one drink equals one 12 oz bottle of beer (355 mL), one 5 oz glass of wine (148 mL), or one 1 oz glass of hard liquor (44 mL). Lifestyle Brush your teeth every morning and night with fluoride toothpaste. Floss one time each day. Exercise for at least 30 minutes 5 or more days each week. Do not use any products that contain nicotine or tobacco. These products include cigarettes, chewing tobacco, and vaping devices, such as e-cigarettes. If you need help quitting, ask your health care provider. Do not use  drugs. If you are sexually active, practice safe sex. Use a condom or other form of protection to prevent STIs. If you do not wish to become pregnant,  use a form of birth control. If you plan to become pregnant, see your health care provider for a prepregnancy visit. Take aspirin only as told by your health care provider. Make sure that you understand how much to take and what form to take. Work with your health care provider to find out whether it is safe and beneficial for you to take aspirin daily. Find healthy ways to manage stress, such as: Meditation, yoga, or listening to music. Journaling. Talking to a trusted person. Spending time with friends and family. Minimize exposure to UV radiation to reduce your risk of skin cancer. Safety Always wear your seat belt while driving or riding in a vehicle. Do not drive: If you have been drinking alcohol. Do not ride with someone who has been drinking. When you are tired or distracted. While texting. If you have been using any mind-altering substances or drugs. Wear a helmet and other protective equipment during sports activities. If you have firearms in your house, make sure you follow all gun safety procedures. Seek help if you have been physically or sexually abused. What's next? Visit your health care provider once a year for an annual wellness visit. Ask your health care provider how often you should have your eyes and teeth checked. Stay up to date on all vaccines. This information is not intended to replace advice given to you by your health care provider. Make sure you discuss any questions you have with your health care provider. Document Revised: 09/13/2020 Document Reviewed: 09/13/2020 Elsevier Patient Education  [place].

## 2022-02-27 ENCOUNTER — Encounter: Payer: Self-pay | Admitting: Nurse Practitioner

## 2022-04-30 ENCOUNTER — Other Ambulatory Visit: Payer: Self-pay | Admitting: Genetic Counselor

## 2022-04-30 DIAGNOSIS — Z8481 Family history of carrier of genetic disease: Secondary | ICD-10-CM

## 2022-05-01 ENCOUNTER — Inpatient Hospital Stay: Payer: 59

## 2022-05-01 ENCOUNTER — Ambulatory Visit
Admission: RE | Admit: 2022-05-01 | Discharge: 2022-05-01 | Disposition: A | Discharge status: Home / Self Care | Payer: 59 | Source: Ambulatory Visit | Attending: Nurse Practitioner | Admitting: Nurse Practitioner

## 2022-05-01 ENCOUNTER — Other Ambulatory Visit: Payer: Self-pay

## 2022-05-01 ENCOUNTER — Inpatient Hospital Stay: Payer: 59 | Attending: Genetic Counselor | Admitting: Genetic Counselor

## 2022-05-01 DIAGNOSIS — Z1231 Encounter for screening mammogram for malignant neoplasm of breast: Secondary | ICD-10-CM | POA: Diagnosis not present

## 2022-05-01 DIAGNOSIS — Z8481 Family history of carrier of genetic disease: Secondary | ICD-10-CM

## 2022-05-01 DIAGNOSIS — Z803 Family history of malignant neoplasm of breast: Secondary | ICD-10-CM

## 2022-05-02 ENCOUNTER — Encounter: Payer: Self-pay | Admitting: Genetic Counselor

## 2022-05-02 NOTE — Progress Notes (Signed)
REFERRING PROVIDER: Flossie Buffy, NP [place],  [place] [address]  PRIMARY PROVIDER:  Nche, Charlene Brooke, NP  PRIMARY REASON FOR VISIT:  1. Family history of breast cancer      HISTORY OF PRESENT ILLNESS:   Ms. Colley, a 52 y.o. female, was seen for a  cancer genetics consultation at the request of Dr. Lorayne Marek due to a family history of breast cancer.  Ms. Thurmond presents to clinic today to discuss the possibility of a hereditary predisposition to cancer, genetic testing, and to further clarify her future cancer risks, as well as potential cancer risks for family members.   Ms. Geffrard is a 52 y.o. female with no personal history of cancer.  Ms. Fader was seen in the cancer genetics clinic in 2017.  At that time she had declined genetic testing to determine if her mother would consider testing.  Her mother does not want to be tested.  CANCER HISTORY:  Oncology History   No history exists.    Past Medical History:  Diagnosis Date   Carpal tunnel syndrome    Family history of breast cancer    Finger fracture, left    5th digit (small finger)   GERD (gastroesophageal reflux disease)    PONV (postoperative nausea and vomiting)    PONV (postoperative nausea and vomiting) 08/10/2019   Varicose vein of leg     Past Surgical History:  Procedure Laterality Date   ABDOMINAL HYSTERECTOMY     AUGMENTATION MAMMAPLASTY Bilateral 2014   LAPAROSCOPIC VAGINAL HYSTERECTOMY WITH SALPINGECTOMY Bilateral 02/25/2018   Procedure: LAPAROSCOPIC ASSISTED VAGINAL HYSTERECTOMY WITH SALPINGECTOMY;  Surgeon: [name], MD;  Location: [facility];  Service: Gynecology;  Laterality: Bilateral;   OPEN REDUCTION INTERNAL FIXATION (ORIF) METACARPAL Left 07/10/2017   Procedure: OPEN REDUCTION INTERNAL FIXATION (ORIF) METACARPAL;  Surgeon: Iran Planas, MD;  Location: [facility];  Service: Orthopedics;  Laterality: Left;   TUBAL LIGATION Bilateral 2005   PPTL    VARICOSE VEIN SURGERY Bilateral    surgery years ago and sclerothraphy ~ 2015    Social History   Socioeconomic History   Marital status: Divorced    Spouse name: Not on file   Number of children: 3   Years of education: Not on file   Highest education level: Not on file  Occupational History   Occupation: [place], [place]   Tobacco Use   Smoking status: Never   Smokeless tobacco: Never  Vaping Use   Vaping Use: Never used  Substance and Sexual Activity   Alcohol use: No   Drug use: No   Sexual activity: Not on file  Other Topics Concern   Not on file  Social History Narrative   Household:pt and one of her daughters    Social Determinants of Health   Financial Resource Strain: Not on file  Food Insecurity: Not on file  Transportation Needs: Not on file  Physical Activity: Not on file  Stress: Not on file  Social Connections: Not on file     FAMILY HISTORY:  We obtained a detailed, 4-generation family history.  Significant diagnoses are listed below: Family History  Problem Relation Age of Onset   Breast cancer Mother 50       second cancer at 21   Hyperlipidemia Father    Kidney disease Father    Hypertension Father    Yves Dill Parkinson White syndrome Brother    Breast cancer Maternal Grandmother 14  second cancer at 41   Breast cancer Cousin        mother's maternal first cousin dx 2x under 22   Breast cancer Other    Colon cancer Neg Hx       The patient has three daughters who are cancer free.  She has three maternal half brothers and six paternal half sisters and three brothers. All are cancer free.  .    The patient's mother was diagnosed with breast cancer at age 51 and again at 58  She has one sister who is cancer free.  The patient's maternal grandmother and two of her sisters had breast cancer, and the patient's mother's first cousin had breast cancer.  The patient's father does not have cancer.  He has three brothers who are  cancer free.  There is no reported family history of cancer on his side of the family.  Ms. Konen is unaware of previous family history of genetic testing for hereditary cancer risks.  There is no reported Ashkenazi Jewish ancestry. There is no known consanguinity.  GENETIC COUNSELING ASSESSMENT: Ms. Ismael is a 52 y.o. female with a family history of cancer which is somewhat suggestive of a hereditary cancer syndrome and predisposition to cancer given the number of cases of breast cancer, young ages of onset and people being diagnosed more than once. We, therefore, discussed and recommended the following at today's visit.   DISCUSSION: We discussed that, in general, most cancer is not inherited in families, but instead is sporadic or familial. Sporadic cancers occur by chance and typically happen at older ages (>50 years) as this type of cancer is caused by genetic changes acquired during an individual's lifetime. Some families have more cancers than would be expected by chance; however, the ages or types of cancer are not consistent with a known genetic mutation or known genetic mutations have been ruled out. This type of familial cancer is thought to be due to a combination of multiple genetic, environmental, hormonal, and lifestyle factors. While this combination of factors likely increases the risk of cancer, the exact source of this risk is not currently identifiable or testable.  We discussed that 5 - 10% of breast cancer is hereditary, with most cases associated with BRCA mutations.  There are other genes that can be associated with hereditary breast cancer syndromes.  These include ATM, CHEK2 and PALB2.  We discussed that testing is beneficial for several reasons including knowing how to follow individuals after completing their treatment, identifying whether potential treatment options such as PARP inhibitors would be beneficial, and understand if other family members could be at risk for cancer  and allow them to undergo genetic testing.   We reviewed the characteristics, features and inheritance patterns of hereditary cancer syndromes. We also discussed genetic testing, including the appropriate family members to test, the process of testing, insurance coverage and turn-around-time for results. We discussed the implications of a negative, positive, carrier and/or variant of uncertain significant result. Ms. Mull  was offered a common hereditary cancer panel (47 genes) and an expanded pan-cancer panel (77 genes). Ms. Klunk was informed of the benefits and limitations of each panel, including that expanded pan-cancer panels contain genes that do not have clear management guidelines at this point in time.  We also discussed that as the number of genes included on a panel increases, the chances of variants of uncertain significance increases. Ms. Vanloan decided to pursue genetic testing for the CancerNext-Expanded+RNAinsight gene panel.   The  CancerNext-Expanded gene panel offered by [employer] and includes sequencing and rearrangement analysis for the following 77 genes: AIP, ALK, APC*, ATM*, AXIN2, BAP1, BARD1, BLM, BMPR1A, BRCA1*, BRCA2*, BRIP1*, CDC73, CDH1*, CDK4, CDKN1B, CDKN2A, CHEK2*, CTNNA1, DICER1, FANCC, FH, FLCN, GALNT12, KIF1B, LZTR1, MAX, MEN1, MET, MLH1*, MSH2*, MSH3, MSH6*, MUTYH*, NBN, NF1*, NF2, NTHL1, PALB2*, PHOX2B, PMS2*, POT1, PRKAR1A, PTCH1, PTEN*, RAD51C*, RAD51D*, RB1, RECQL, RET, SDHA, SDHAF2, SDHB, SDHC, SDHD, SMAD4, SMARCA4, SMARCB1, SMARCE1, STK11, SUFU, TMEM127, TP53*, TSC1, TSC2, VHL and XRCC2 (sequencing and deletion/duplication); EGFR, EGLN1, HOXB13, KIT, MITF, PDGFRA, POLD1, and POLE (sequencing only); EPCAM and GREM1 (deletion/duplication only). DNA and RNA analyses performed for * genes.   Based on Ms. Sylvia's family history of cancer, she meets medical criteria for genetic testing. Despite that she meets criteria, she may still have an out of pocket cost. We  discussed that if her out of pocket cost for testing is over $100, the laboratory will call and confirm whether she wants to proceed with testing.  If the out of pocket cost of testing is less than $100 she will be billed by the genetic testing laboratory.   We discussed that some people do not want to undergo genetic testing due to fear of genetic discrimination.  The Genetic Information Nondiscrimination Act (GINA) was signed into federal law in 2008. GINA prohibits health insurers and most employers from discriminating against individuals based on genetic information (including the results of genetic tests and family history information). According to GINA, health insurance companies cannot consider genetic information to be a preexisting condition, nor can they use it to make decisions regarding coverage or rates. GINA also makes it illegal for most employers to use genetic information in making decisions about hiring, firing, promotion, or terms of employment. It is important to note that GINA does not offer protections for life insurance, disability insurance, or long-term care insurance. GINA does not apply to those in the [employer], those who work for companies with less than 15 employees, and new life insurance or long-term disability insurance policies.  Health status due to a cancer diagnosis is not protected under GINA. More information about GINA can be found by visiting [email or web address].   PLAN: Despite our recommendation, Ms. Goosby did not wish to pursue genetic testing at today's visit. We understand this decision and remain available to coordinate genetic testing at any time in the future. We, therefore, recommend Ms. Gillson continue to follow the cancer screening guidelines given by her primary healthcare provider.  Lastly, we encouraged Ms. Sublette to remain in contact with cancer genetics annually so that we can continuously update the family history and inform her of any changes in cancer  genetics and testing that may be of benefit for this family.   Ms. Wiers questions were answered to her satisfaction today. Our contact information was provided should additional questions or concerns arise. Thank you for the referral and allowing [place] to share in the care of your patient.   Launa Goedken P. Florene Glen, [place], [facility] Licensed, Insurance risk surveyor [email or web address] phone: [phone]  The patient was seen for a total of 55 minutes in face-to-face genetic counseling.  The patient was seen alone.  Drs. Michell Heinrich, and/or Andres were available for questions, if needed..    _______________________________________________________________________ For Office Staff:  Number of people involved in session: 1 Was an Intern/ student involved with case: no

## 2022-05-06 ENCOUNTER — Other Ambulatory Visit: Payer: Self-pay | Admitting: Nurse Practitioner

## 2022-05-06 DIAGNOSIS — R928 Other abnormal and inconclusive findings on diagnostic imaging of breast: Secondary | ICD-10-CM

## 2022-05-21 ENCOUNTER — Other Ambulatory Visit: Payer: 59

## 2022-06-03 ENCOUNTER — Ambulatory Visit
Admission: RE | Admit: 2022-06-03 | Discharge: 2022-06-03 | Disposition: A | Discharge status: Home / Self Care | Payer: 59 | Source: Ambulatory Visit | Attending: Nurse Practitioner | Admitting: Nurse Practitioner

## 2022-06-03 DIAGNOSIS — N6321 Unspecified lump in the left breast, upper outer quadrant: Secondary | ICD-10-CM | POA: Diagnosis not present

## 2022-06-03 DIAGNOSIS — R928 Other abnormal and inconclusive findings on diagnostic imaging of breast: Secondary | ICD-10-CM | POA: Diagnosis not present

## 2022-06-04 ENCOUNTER — Other Ambulatory Visit: Payer: Self-pay | Admitting: Nurse Practitioner

## 2022-06-04 DIAGNOSIS — N6320 Unspecified lump in the left breast, unspecified quadrant: Secondary | ICD-10-CM

## 2022-06-11 ENCOUNTER — Ambulatory Visit
Admission: RE | Admit: 2022-06-11 | Discharge: 2022-06-11 | Disposition: A | Discharge status: Home / Self Care | Payer: 59 | Source: Ambulatory Visit | Attending: Nurse Practitioner | Admitting: Nurse Practitioner

## 2022-06-11 DIAGNOSIS — N6320 Unspecified lump in the left breast, unspecified quadrant: Secondary | ICD-10-CM

## 2022-06-11 DIAGNOSIS — N6321 Unspecified lump in the left breast, upper outer quadrant: Secondary | ICD-10-CM | POA: Diagnosis not present

## 2022-06-11 HISTORY — PX: BREAST BIOPSY: SHX20

## 2022-06-12 ENCOUNTER — Telehealth: Payer: Self-pay | Admitting: Hematology and Oncology

## 2022-06-12 NOTE — Telephone Encounter (Signed)
Left message for patient to return my call in reference to upcoming [facility] appointment on 3/20, sent email as well with packet for patient to fill out

## 2022-06-18 ENCOUNTER — Encounter: Payer: Self-pay | Admitting: *Deleted

## 2022-06-18 DIAGNOSIS — Z170 Estrogen receptor positive status [ER+]: Secondary | ICD-10-CM

## 2022-06-18 NOTE — Progress Notes (Signed)
Radiation Oncology         [phone] ________________________________  Multidisciplinary Breast Oncology Clinic [facility]) Initial Outpatient Consultation  Name: Joanne Wilson MRN: [id number]  Date: 06/19/2022  DOB: [birth year]  OZ:HYQM, Bonna Gains, NP  Harriette Bouillon, MD   REFERRING PHYSICIAN: Harriette Bouillon, MD  DIAGNOSIS: The encounter diagnosis was Malignant neoplasm of upper-outer quadrant of left breast in female, estrogen receptor positive (HCC).  Stage IA left Breast UOQ, Invasive Lobular Carcinoma, ER+ / PR+ / Her2-, Grade 1    ICD-10-CM   1. Malignant neoplasm of upper-outer quadrant of left breast in female, estrogen receptor positive (HCC)  C50.412    Z17.0       HISTORY OF PRESENT ILLNESS::Joanne Wilson is a 52 y.o. female who is presenting to the office today for evaluation of her newly diagnosed breast cancer. She is accompanied by her mother who is breast cancer survivor and daughter. She is doing well overall.   She had routine screening mammography on 05/01/22 showing a possible abnormality in the left breast. She underwent unilateral left breast diagnostic mammography with tomography and left breast ultrasonography at [facility] on 06/03/22 showing: a 12 x 10 x 16 mm irregular hypoechoic mass in the 2 o'clock left breast 7 cmfn, and an adjacent 3 x 6 x 4 mm irregular hypoechoic mass also located in the 2 o'clock breast, 7 cmfn. No left axillary adenopathy was appreciated.  Biopsies of the two 2 o'clock left breast masses on 06/11/22 both showed: grade 1 invasive lobular carcinoma measuring 3.5 mm and 7 mm in the greatest linear extent of the samples respectively. Prognostic indicators significant for: estrogen receptor, 95% positive and progesterone receptor, 95% positive, both with strong staining intensity. Proliferation marker Ki67 at 10%. HER2 negative.  Of note: The patient has a family history of breast cancer. She met with genetics on 05/01/22 but  declined genetic testing.     The patient was referred today for presentation in the multidisciplinary conference.  Radiology studies and pathology slides were presented there for review and discussion of treatment options.  A consensus was discussed regarding potential next steps.  PREVIOUS RADIATION THERAPY: No  PAST MEDICAL HISTORY:  Past Medical History:  Diagnosis Date   Carpal tunnel syndrome    Family history of breast cancer    Finger fracture, left    5th digit (small finger)   GERD (gastroesophageal reflux disease)    PONV (postoperative nausea and vomiting)    PONV (postoperative nausea and vomiting) 08/10/2019   Varicose vein of leg     PAST SURGICAL HISTORY: Past Surgical History:  Procedure Laterality Date   ABDOMINAL HYSTERECTOMY     AUGMENTATION MAMMAPLASTY Bilateral 2014   BREAST BIOPSY Left 06/11/2022   [place] LT BREAST BX W LOC DEV 1ST LESION IMG BX SPEC US GUIDE 06/11/2022 [facility]   BREAST BIOPSY Left 06/11/2022   [place] LT BREAST BX W LOC DEV EA ADD LESION IMG BX SPEC US GUIDE 06/11/2022 [facility]   LAPAROSCOPIC VAGINAL HYSTERECTOMY WITH SALPINGECTOMY Bilateral 02/25/2018   Procedure: LAPAROSCOPIC ASSISTED VAGINAL HYSTERECTOMY WITH SALPINGECTOMY;  Surgeon: Geryl Rankins, MD;  Location: [facility];  Service: Gynecology;  Laterality: Bilateral;   OPEN REDUCTION INTERNAL FIXATION (ORIF) METACARPAL Left 07/10/2017   Procedure: OPEN REDUCTION INTERNAL FIXATION (ORIF) METACARPAL;  Surgeon: Bradly Bienenstock, MD;  Location: [facility];  Service: Orthopedics;  Laterality: Left;   TUBAL LIGATION Bilateral 2005   PPTL   VARICOSE VEIN SURGERY Bilateral  surgery years ago and sclerothraphy ~ 2015    FAMILY HISTORY:  Family History  Problem Relation Age of Onset   Breast cancer Mother 70       second cancer at 40   Hyperlipidemia Father    Kidney disease Father    Hypertension Father    Evelene Croon Parkinson White syndrome Brother    Breast cancer  Maternal Grandmother 59       second cancer at 77   Breast cancer Cousin        mother's maternal first cousin dx 2x under 50   Breast cancer Other    Colon cancer Neg Hx     SOCIAL HISTORY:  Social History   Socioeconomic History   Marital status: Divorced    Spouse name: Not on file   Number of children: 3   Years of education: Not on file   Highest education level: Not on file  Occupational History   Occupation: CMA, [employer]   Tobacco Use   Smoking status: Never   Smokeless tobacco: Never  Vaping Use   Vaping Use: Never used  Substance and Sexual Activity   Alcohol use: No   Drug use: No   Sexual activity: Not on file  Other Topics Concern   Not on file  Social History Narrative   Household:pt and one of her daughters    Social Determinants of Health   Financial Resource Strain: Low Risk  (06/19/2022)   Overall Financial Resource Strain (CARDIA)    Difficulty of Paying Living Expenses: Not very hard  Food Insecurity: No Food Insecurity (06/19/2022)   Hunger Vital Sign    Worried About Running Out of Food in the Last Year: Never true    Ran Out of Food in the Last Year: Never true  Transportation Needs: No Transportation Needs (06/19/2022)   PRAPARE - Administrator, Civil Service (Medical): No    Lack of Transportation (Non-Medical): No  Physical Activity: Not on file  Stress: Not on file  Social Connections: Not on file    ALLERGIES: No Known Allergies  MEDICATIONS:  Current Outpatient Medications  Medication Sig Dispense Refill   Digestive Enzymes (DIGESTIVE ENZYME PO) Take by mouth.     NUTRITIONAL SUPPLEMENTS PO Take by mouth.     Omega-3 Fatty Acids (FISH OIL OMEGA-3 PO) Take by mouth.     UNABLE TO FIND Magnesium spray QHS     UNABLE TO FIND daily. Med Name:Sea Moss     UNABLE TO FIND as needed. Med Name: Maca Root,ashwaganda     UNABLE TO FIND daily. Med Name: Shilajit- Black seed oil     No current  facility-administered medications for this encounter.    REVIEW OF SYSTEMS: A 10+ POINT REVIEW OF SYSTEMS WAS OBTAINED including neurology, dermatology, psychiatry, cardiac, respiratory, lymph, extremities, GI, GU, musculoskeletal, constitutional, reproductive, HEENT. On the provided form, she reports no pain within the breast nipple discharge or bleeding.    PHYSICAL EXAM: Lungs are clear to auscultation bilaterally. Heart has regular rate and rhythm. No palpable cervical, supraclavicular, or axillary adenopathy. Abdomen soft, non-tender, normal bowel sounds. Breast: Right breast with no palpable mass, nipple discharge, or bleeding.  Cosmetic implant in place reported as retropectoral.  Left breast with biopsy site noted in the 3 o'clock position of the breast.  No dominant mass appreciated in the breast nipple discharge or bleeding..  Cosmetic implants reported as retropectoral.  KPS = 100  100 - Normal;  no complaints; no evidence of disease. 90   - Able to carry on normal activity; minor signs or symptoms of disease. 80   - Normal activity with effort; some signs or symptoms of disease. 28   - Cares for self; unable to carry on normal activity or to do active work. 60   - Requires occasional assistance, but is able to care for most of his personal needs. 50   - Requires considerable assistance and frequent medical care. 40   - Disabled; requires special care and assistance. 30   - Severely disabled; hospital admission is indicated although death not imminent. 20   - Very sick; hospital admission necessary; active supportive treatment necessary. 10   - Moribund; fatal processes progressing rapidly. 0     - Dead  Karnofsky DA, [facility], Craver LS and Burchenal [facility] [phone]) The use of the nitrogen mustards in the palliative treatment of carcinoma: with particular reference to bronchogenic carcinoma Cancer 1 634-56  LABORATORY DATA:  Lab Results  Component Value Date   WBC 5.3 06/19/2022    HGB 12.2 06/19/2022   HCT 38.7 06/19/2022   MCV 82.2 06/19/2022   PLT 245 06/19/2022   Lab Results  Component Value Date   NA 139 06/19/2022   K 4.3 06/19/2022   CL 107 06/19/2022   CO2 27 06/19/2022   Lab Results  Component Value Date   ALT 28 06/19/2022   AST 52 (H) 06/19/2022   ALKPHOS 78 06/19/2022   BILITOT 0.4 06/19/2022    PULMONARY FUNCTION TEST:   Review Flowsheet        No data to display          RADIOGRAPHY: [place] LT BREAST BX W LOC DEV 1ST LESION IMG BX SPEC US GUIDE  Addendum Date: 06/18/2022   ADDENDUM REPORT: 06/18/2022 07:56 ADDENDUM: Pathology revealed GRADE I INVASIVE LOBULAR CARCINOMA of the LEFT breast, 2 o'clock 7 cmfn, (coil clip). This was found to be concordant by Dr. Annia Belt. Pathology revealed GRADE I INVASIVE LOBULAR CARCINOMA of the LEFT breast, 2 o'clock 7 cmfn, (heart clip). This was found to be concordant by Dr. Annia Belt. Pathology results were discussed with the patient by telephone. The patient reported doing well after the biopsies with minimal tenderness at the sites. Post biopsy instructions and care were reviewed and questions were answered. The patient was encouraged to call [facility] for any additional concerns. My direct phone number was provided. The patient was referred to [facility] at [facility] on June 19, 2022. Recommendation for a bilateral breast MRI given the lobular histology and family history. Pathology results reported by Rene Kocher, RN on 06/13/2022. Electronically Signed   By: Annia Belt M.D.   On: 06/18/2022 07:56   Result Date: 06/18/2022 CLINICAL DATA:  Patient with indeterminate left breast mass 2 o'clock position 7 cm from the nipple EXAM: ULTRASOUND GUIDED LEFT BREAST CORE NEEDLE BIOPSY COMPARISON:  Previous exam(s). PROCEDURE: I met with the patient and we discussed the procedure of ultrasound-guided biopsy, including  benefits and alternatives. We discussed the high likelihood of a successful procedure. We discussed the risks of the procedure, including infection, bleeding, tissue injury, clip migration, and inadequate sampling. Informed written consent was given. The usual time-out protocol was performed immediately prior to the procedure. Site 1: Left breast mass 2 o'clock position 7 cm from nipple: Coil clip Lesion quadrant: Upper outer quadrant Using sterile  technique and 1% Lidocaine as local anesthetic, under direct ultrasound visualization, a 14 gauge spring-loaded device was used to perform biopsy of left breast mass 2 o'clock position 7 cm from the nipple using a lateral approach. At the conclusion of the procedure coil shaped tissue marker clip was deployed into the biopsy cavity. Follow up 2 view mammogram was performed and dictated separately. Site 2: Left breast mass 2 o'clock position 7 cm from the nipple: Heart clip Lesion quadrant: Upper outer quadrant Using sterile technique and 1% Lidocaine as local anesthetic, under direct ultrasound visualization, a 14 gauge spring-loaded device was used to perform biopsy of left breast mass 2 o'clock position 7 cm from the nipple using a lateral approach. At the conclusion of the procedure heart shaped tissue marker clip was deployed into the biopsy cavity. Follow up 2 view mammogram was performed and dictated separately. IMPRESSION: Ultrasound guided biopsy of left breast masses. No apparent complications. Electronically Signed: By: Annia Belt M.D. On: 06/11/2022 14:58   [place] LT BREAST BX W LOC DEV EA ADD LESION IMG BX SPEC US GUIDE  Addendum Date: 06/18/2022   ADDENDUM REPORT: 06/18/2022 07:56 ADDENDUM: Pathology revealed GRADE I INVASIVE LOBULAR CARCINOMA of the LEFT breast, 2 o'clock 7 cmfn, (coil clip). This was found to be concordant by Dr. Annia Belt. Pathology revealed GRADE I INVASIVE LOBULAR CARCINOMA of the LEFT breast, 2 o'clock 7 cmfn, (heart clip). This was  found to be concordant by Dr. Annia Belt. Pathology results were discussed with the patient by telephone. The patient reported doing well after the biopsies with minimal tenderness at the sites. Post biopsy instructions and care were reviewed and questions were answered. The patient was encouraged to call [facility] for any additional concerns. My direct phone number was provided. The patient was referred to [facility] on June 19, 2022. Recommendation for a bilateral breast MRI given the lobular histology and family history. Pathology results reported by Rene Kocher, RN on 06/13/2022. Electronically Signed   By: Annia Belt M.D.   On: 06/18/2022 07:56   Result Date: 06/18/2022 CLINICAL DATA:  Patient with indeterminate left breast mass 2 o'clock position 7 cm from the nipple EXAM: ULTRASOUND GUIDED LEFT BREAST CORE NEEDLE BIOPSY COMPARISON:  Previous exam(s). PROCEDURE: I met with the patient and we discussed the procedure of ultrasound-guided biopsy, including benefits and alternatives. We discussed the high likelihood of a successful procedure. We discussed the risks of the procedure, including infection, bleeding, tissue injury, clip migration, and inadequate sampling. Informed written consent was given. The usual time-out protocol was performed immediately prior to the procedure. Site 1: Left breast mass 2 o'clock position 7 cm from nipple: Coil clip Lesion quadrant: Upper outer quadrant Using sterile technique and 1% Lidocaine as local anesthetic, under direct ultrasound visualization, a 14 gauge spring-loaded device was used to perform biopsy of left breast mass 2 o'clock position 7 cm from the nipple using a lateral approach. At the conclusion of the procedure coil shaped tissue marker clip was deployed into the biopsy cavity. Follow up 2 view mammogram was performed and dictated separately. Site 2:  Left breast mass 2 o'clock position 7 cm from the nipple: Heart clip Lesion quadrant: Upper outer quadrant Using sterile technique and 1% Lidocaine as local anesthetic, under direct ultrasound visualization, a 14 gauge spring-loaded device was used to perform biopsy of left breast mass 2 o'clock position 7 cm from  the nipple using a lateral approach. At the conclusion of the procedure heart shaped tissue marker clip was deployed into the biopsy cavity. Follow up 2 view mammogram was performed and dictated separately. IMPRESSION: Ultrasound guided biopsy of left breast masses. No apparent complications. Electronically Signed: By: Annia Belt M.D. On: 06/11/2022 14:58   MM CLIP PLACEMENT LEFT  Result Date: 06/11/2022 CLINICAL DATA:  Patient status post ultrasound biopsy left breast masses. EXAM: 3D DIAGNOSTIC LEFT MAMMOGRAM POST ULTRASOUND BIOPSY COMPARISON:  Previous exam(s). FINDINGS: 3D Mammographic images were obtained following ultrasound-guided biopsy left breast masses. Site 1: Left breast mass 2 o'clock position 7 cm from the nipple: Coil shaped clip: In appropriate position. Site 2: Left breast mass 2 o'clock position 7 cm from the nipple: Heart clip: In appropriate position. IMPRESSION: Appropriate positioning of the biopsy marking clips as above. Final Assessment: Post Procedure Mammograms for Marker Placement Electronically Signed   By: Annia Belt M.D.   On: 06/11/2022 15:22  MM DIAG BREAST W/IMPLANT TOMO UNI L  Result Date: 06/03/2022 CLINICAL DATA:  Patient recalled from screening for left breast mass. EXAM: DIGITAL DIAGNOSTIC UNILATERAL LEFT MAMMOGRAM WITH IMPLANTS, TOMOSYNTHESIS; ULTRASOUND LEFT BREAST LIMITED TECHNIQUE: Left digital diagnostic mammography and breast tomosynthesis was performed. Standard and/or implant displaced views were performed.; Targeted ultrasound examination of the left breast was performed. COMPARISON:  Previous exam(s). ACR Breast Density Category b: There are  scattered areas of fibroglandular density. FINDINGS: There is a persistent irregular mass within the upper-outer left breast. There is a small adjacent irregular mass within the upper-outer left breast. The patient has retropectoral implants. Targeted ultrasound is performed, showing a 12 x 10 x 16 mm irregular hypoechoic mass left breast 2 o'clock position 7 cm from the nipple. There is an adjacent 3 x 6 x 4 mm irregular hypoechoic mass left breast 2 o'clock position 7 cm from the nipple. No left axillary adenopathy. IMPRESSION: There are 2 adjacent suspicious masses within the left breast 2 o'clock position 7 cm from the nipple. RECOMMENDATION: Ultrasound-guided core needle biopsy of the adjacent suspicious left breast mass 2 o'clock position. I have discussed the findings and recommendations with the patient. If applicable, a reminder letter will be sent to the patient regarding the next appointment. BI-RADS CATEGORY  4: Suspicious. Electronically Signed   By: Annia Belt M.D.   On: 06/03/2022 17:48  US BREAST LTD UNI LEFT INC AXILLA  Result Date: 06/03/2022 CLINICAL DATA:  Patient recalled from screening for left breast mass. EXAM: DIGITAL DIAGNOSTIC UNILATERAL LEFT MAMMOGRAM WITH IMPLANTS, TOMOSYNTHESIS; ULTRASOUND LEFT BREAST LIMITED TECHNIQUE: Left digital diagnostic mammography and breast tomosynthesis was performed. Standard and/or implant displaced views were performed.; Targeted ultrasound examination of the left breast was performed. COMPARISON:  Previous exam(s). ACR Breast Density Category b: There are scattered areas of fibroglandular density. FINDINGS: There is a persistent irregular mass within the upper-outer left breast. There is a small adjacent irregular mass within the upper-outer left breast. The patient has retropectoral implants. Targeted ultrasound is performed, showing a 12 x 10 x 16 mm irregular hypoechoic mass left breast 2 o'clock position 7 cm from the nipple. There is an adjacent 3  x 6 x 4 mm irregular hypoechoic mass left breast 2 o'clock position 7 cm from the nipple. No left axillary adenopathy. IMPRESSION: There are 2 adjacent suspicious masses within the left breast 2 o'clock position 7 cm from the nipple. RECOMMENDATION: Ultrasound-guided core needle biopsy of the adjacent suspicious left breast mass 2 o'clock position.  I have discussed the findings and recommendations with the patient. If applicable, a reminder letter will be sent to the patient regarding the next appointment. BI-RADS CATEGORY  4: Suspicious. Electronically Signed   By: Annia Belt M.D.   On: 06/03/2022 17:48     IMPRESSION: Stage IA Left Breast UOQ, Invasive Lobular Carcinoma, ER+ / PR+ / Her2-, Grade 1  Patient will be a good candidate for breast conservation with radiotherapy to the left breast. We discussed the general course of radiation, potential side effects, and toxicities with radiation and the patient is interested in this approach.  She does have cosmetic implants in place and therefore would not be a good candidate for hypofractionated accelerated radiation therapy.  She would be a good candidate for standard fractionation whole breast radiation therapy followed by a boost to the lumpectomy cavity.  Pending sentinel node procedure she may also benefit from regional nodal therapy   PLAN:  Genetics evaluation Lumpectomy and sentinel node procedure Oncotype Dx testing to determine the potential benefit from adjuvant chemotherapy Adjuvant radiation therapy Adjuvant hormonal therapy    ------------------------------------------------  Billie Lade, PhD, MD  This document serves as a record of services personally performed by Antony Blackbird, MD. It was created on his behalf by Neena Rhymes, a trained medical scribe. The creation of this record is based on the scribe's personal observations and the provider's statements to them. This document has been checked and approved by the attending  provider.

## 2022-06-19 ENCOUNTER — Inpatient Hospital Stay: Payer: 59 | Attending: Genetic Counselor | Admitting: Hematology and Oncology

## 2022-06-19 ENCOUNTER — Inpatient Hospital Stay: Payer: 59 | Admitting: Licensed Clinical Social Worker

## 2022-06-19 ENCOUNTER — Encounter: Payer: Self-pay | Admitting: Emergency Medicine

## 2022-06-19 ENCOUNTER — Ambulatory Visit: Payer: 59 | Attending: Surgery | Admitting: Physical Therapy

## 2022-06-19 ENCOUNTER — Ambulatory Visit: Payer: Self-pay | Admitting: Surgery

## 2022-06-19 ENCOUNTER — Other Ambulatory Visit: Payer: Self-pay

## 2022-06-19 ENCOUNTER — Inpatient Hospital Stay: Payer: 59

## 2022-06-19 ENCOUNTER — Encounter: Payer: Self-pay | Admitting: Physical Therapy

## 2022-06-19 ENCOUNTER — Encounter: Payer: Self-pay | Admitting: *Deleted

## 2022-06-19 ENCOUNTER — Ambulatory Visit
Admission: RE | Admit: 2022-06-19 | Discharge: 2022-06-19 | Disposition: A | Discharge status: Home / Self Care | Payer: 59 | Source: Ambulatory Visit | Attending: Radiation Oncology | Admitting: Radiation Oncology

## 2022-06-19 VITALS — BP 141/75 | HR 79 | Temp 97.7°F | Resp 18 | Ht 67.0 in | Wt 208.6 lb

## 2022-06-19 DIAGNOSIS — Z170 Estrogen receptor positive status [ER+]: Secondary | ICD-10-CM

## 2022-06-19 DIAGNOSIS — Z5189 Encounter for other specified aftercare: Secondary | ICD-10-CM | POA: Diagnosis not present

## 2022-06-19 DIAGNOSIS — C50412 Malignant neoplasm of upper-outer quadrant of left female breast: Secondary | ICD-10-CM

## 2022-06-19 DIAGNOSIS — R293 Abnormal posture: Secondary | ICD-10-CM

## 2022-06-19 DIAGNOSIS — C50912 Malignant neoplasm of unspecified site of left female breast: Secondary | ICD-10-CM

## 2022-06-19 LAB — CBC WITH DIFFERENTIAL (CANCER CENTER ONLY)
Abs Immature Granulocytes: 0.01 10*3/uL (ref 0.00–0.07)
Basophils Absolute: 0 10*3/uL (ref 0.0–0.1)
Basophils Relative: 1 %
Eosinophils Absolute: 0.1 10*3/uL (ref 0.0–0.5)
Eosinophils Relative: 1 %
HCT: 38.7 % (ref 36.0–46.0)
Hemoglobin: 12.2 g/dL (ref 12.0–15.0)
Immature Granulocytes: 0 %
Lymphocytes Relative: 34 %
Lymphs Abs: 1.8 10*3/uL (ref 0.7–4.0)
MCH: 25.9 pg — ABNORMAL LOW (ref 26.0–34.0)
MCHC: 31.5 g/dL (ref 30.0–36.0)
MCV: 82.2 fL (ref 80.0–100.0)
Monocytes Absolute: 0.6 10*3/uL (ref 0.1–1.0)
Monocytes Relative: 11 %
Neutro Abs: 2.8 10*3/uL (ref 1.7–7.7)
Neutrophils Relative %: 53 %
Platelet Count: 245 10*3/uL (ref 150–400)
RBC: 4.71 MIL/uL (ref 3.87–5.11)
RDW: 13.7 % (ref 11.5–15.5)
WBC Count: 5.3 10*3/uL (ref 4.0–10.5)
nRBC: 0 % (ref 0.0–0.2)

## 2022-06-19 LAB — CMP (CANCER CENTER ONLY)
ALT: 28 U/L (ref 0–44)
AST: 52 U/L — ABNORMAL HIGH (ref 15–41)
Albumin: 4.1 g/dL (ref 3.5–5.0)
Alkaline Phosphatase: 78 U/L (ref 38–126)
Anion gap: 5 (ref 5–15)
BUN: 14 mg/dL (ref 6–20)
CO2: 27 mmol/L (ref 22–32)
Calcium: 9.1 mg/dL (ref 8.9–10.3)
Chloride: 107 mmol/L (ref 98–111)
Creatinine: 0.96 mg/dL (ref 0.44–1.00)
GFR, Estimated: >60 mL/min (ref >60)
Glucose, Bld: 105 mg/dL — ABNORMAL HIGH (ref 70–99)
Potassium: 4.3 mmol/L (ref 3.5–5.1)
Sodium: 139 mmol/L (ref 135–145)
Total Bilirubin: 0.4 mg/dL (ref 0.3–1.2)
Total Protein: 6.9 g/dL (ref 6.5–8.1)

## 2022-06-19 LAB — GENETIC SCREENING ORDER

## 2022-06-19 NOTE — Research (Signed)
[employer] 2021-05 - Specimen Collection Study to Evaluate Biomarkers in Subjects with Cancer   INTRO TO STUDY/CONSENT FORMS  Patient Joanne Wilson was identified by Clinical Research as a potential candidate for the above listed study.  This Clinical Research Nurse met with IMALA ADAMCIK, [id number], on 06/19/22 in a manner and location that ensures patient privacy to discuss participation in the above listed research study.  Patient is Accompanied by family .  A copy of the informed consent document with embedded HIPAA language was provided to the patient.  Patient reads, speaks, and understands [place].   Patient was provided with the business card of this Nurse and encouraged to contact the research team with any questions.  Approximately 15 minutes were spent with the patient reviewing the informed consent documents.  Patient was provided the option of taking informed consent documents home to review and was encouraged to review at their convenience with their support network, including other care providers. Patient took the consent documents home to review.  Will f/u on interest in the next few days.  Wells Guiles 'Learta Codding' Neysa Bonito, RN, BSN, [facility] Clinical Research Nurse I 06/19/22 12:00 PM

## 2022-06-19 NOTE — Progress Notes (Signed)
[facility] NOTE  Patient Care Team: Nche, Charlene Brooke, NP as PCP - General (Internal Medicine) Patient, No Pcp Per (General Practice) Erroll Luna, MD as Consulting Physician (General Surgery) Benay Pike, MD as Consulting Physician (Hematology and Oncology) Gery Pray, MD as Consulting Physician (Radiation Oncology) Mauro Kaufmann, RN as Oncology Nurse Navigator Rockwell Germany, RN as Oncology Nurse Navigator  CHIEF COMPLAINTS/PURPOSE OF CONSULTATION:  Newly diagnosed breast cancer  HISTORY OF PRESENTING ILLNESS:  Joanne Wilson 52 y.o. female is here because of recent diagnosis of left breast cancer  I reviewed her records extensively and collaborated the history with the patient.  SUMMARY OF ONCOLOGIC HISTORY: Oncology History   No history exists.   Patient had bilateral screening mammogram which showed a possible asymmetry in the left breast warranting further investigation.  On diagnostic mammogram, there were 2 adjacent suspicious masses in the left breast 2 o'clock position 7 cm from the nipple. Pathology from the left breast needle core biopsy showed grade 1 invasive lobular carcinoma, prognostic showed ER +95% strong staining PR 95% positive strong staining Ki-67 of 10%.  According to the pathologist, both's masses appear identical under the microscope.  She is now referred to breast [place]. Mom and daughter are here with her today Mom had BC in her early 63's. Maternal grand mother had breast cancer in 69's. She denies any major medical issues. Rest of the pertinent 10 point ROS reviewed and negative  MEDICAL HISTORY:  Past Medical History:  Diagnosis Date   Carpal tunnel syndrome    Family history of breast cancer    Finger fracture, left    5th digit (small finger)   GERD (gastroesophageal reflux disease)    PONV (postoperative nausea and vomiting)    PONV (postoperative nausea and vomiting) 08/10/2019   Varicose vein of leg      SURGICAL HISTORY: Past Surgical History:  Procedure Laterality Date   ABDOMINAL HYSTERECTOMY     AUGMENTATION MAMMAPLASTY Bilateral 2014   BREAST BIOPSY Left 06/11/2022   [place] LT BREAST BX W LOC DEV 1ST LESION IMG BX SPEC US GUIDE 06/11/2022 [facility]   BREAST BIOPSY Left 06/11/2022   [place] LT BREAST BX W LOC DEV EA ADD LESION IMG BX SPEC US GUIDE 06/11/2022 [facility]   LAPAROSCOPIC VAGINAL HYSTERECTOMY WITH SALPINGECTOMY Bilateral 02/25/2018   Procedure: LAPAROSCOPIC ASSISTED VAGINAL HYSTERECTOMY WITH SALPINGECTOMY;  Surgeon: [name], MD;  Location: [facility];  Service: Gynecology;  Laterality: Bilateral;   OPEN REDUCTION INTERNAL FIXATION (ORIF) METACARPAL Left 07/10/2017   Procedure: OPEN REDUCTION INTERNAL FIXATION (ORIF) METACARPAL;  Surgeon: Iran Planas, MD;  Location: [facility];  Service: Orthopedics;  Laterality: Left;   TUBAL LIGATION Bilateral 2005   PPTL   VARICOSE VEIN SURGERY Bilateral    surgery years ago and sclerothraphy ~ 2015    SOCIAL HISTORY: Social History   Socioeconomic History   Marital status: Divorced    Spouse name: Not on file   Number of children: 3   Years of education: Not on file   Highest education level: Not on file  Occupational History   Occupation: [employer], [place]   Tobacco Use   Smoking status: Never   Smokeless tobacco: Never  Vaping Use   Vaping Use: Never used  Substance and Sexual Activity   Alcohol use: No   Drug use: No   Sexual activity: Not on file  Other Topics Concern   Not on file  Social History Narrative   Household:pt and one of her daughters    Social Determinants of Radio broadcast assistant Strain: Not on file  Food Insecurity: Not on file  Transportation Needs: Not on file  Physical Activity: Not on file  Stress: Not on file  Social Connections: Not on file  Intimate Partner Violence: Not on file    FAMILY HISTORY: Family History  Problem Relation Age  of Onset   Breast cancer Mother 46       second cancer at 64   Hyperlipidemia Father    Kidney disease Father    Hypertension Father    Scientist, research (medical) Parkinson White syndrome Brother    Breast cancer Maternal Grandmother 62       second cancer at 71   Breast cancer Cousin        mother's maternal first cousin dx 2x under 25   Breast cancer Other    Colon cancer Neg Hx     ALLERGIES:  has No Known Allergies.  MEDICATIONS:  Current Outpatient Medications  Medication Sig Dispense Refill   UNABLE TO FIND Magnesium spray QHS     UNABLE TO FIND daily. Med Name:Sea Moss     UNABLE TO FIND daily. Med Name: Tracy seed oil     Digestive Enzymes (DIGESTIVE ENZYME PO) Take by mouth.     NUTRITIONAL SUPPLEMENTS PO Take by mouth.     Omega-3 Fatty Acids (FISH OIL OMEGA-3 PO) Take by mouth.     UNABLE TO FIND as needed. Med Name: Maca Root,ashwaganda     No current facility-administered medications for this visit.    REVIEW OF SYSTEMS:   Constitutional: Denies fevers, chills or abnormal night sweats Eyes: Denies blurriness of vision, double vision or watery eyes Ears, nose, mouth, throat, and face: Denies mucositis or sore throat Respiratory: Denies cough, dyspnea or wheezes Cardiovascular: Denies palpitation, chest discomfort or lower extremity swelling Gastrointestinal:  Denies nausea, heartburn or change in bowel habits Skin: Denies abnormal skin rashes Lymphatics: Denies new lymphadenopathy or easy bruising Neurological:Denies numbness, tingling or new weaknesses Behavioral/Psych: Mood is stable, no new changes  Breast: Denies any palpable lumps or discharge All other systems were reviewed with the patient and are negative.  PHYSICAL EXAMINATION: ECOG PERFORMANCE STATUS: 0 - Asymptomatic  Vitals:   06/19/22 0855  BP: (!) 141/75  Pulse: 79  Resp: 18  Temp: 97.7 F (36.5 C)  SpO2: 100%   Filed Weights   06/19/22 0855  Weight: 208 lb 9.6 oz (94.6 kg)     GENERAL:alert, no distress and comfortable SKIN: skin color, texture, turgor are normal, no rashes or significant lesions EYES: normal, conjunctiva are pink and non-injected, sclera clear OROPHARYNX:no exudate, no erythema and lips, buccal mucosa, and tongue normal  NECK: supple, thyroid normal size, non-tender, without nodularity LYMPH:  no palpable lymphadenopathy in the cervical, axillary or inguinal LUNGS: clear to auscultation and percussion with normal breathing effort HEART: regular rate & rhythm and no murmurs and no lower extremity edema ABDOMEN:abdomen soft, non-tender and normal bowel sounds Musculoskeletal:no cyanosis of digits and no clubbing  PSYCH: alert & oriented x 3 with fluent speech NEURO: no focal motor/sensory deficits Breasts: Bilateral breasts inspected.  She has implants.  The implants seem to be displaced towards the outer portion of the breast.  There is no palpable masses.  Postbiopsy changes noted.  No regional adenopathy.  LABORATORY DATA:  I have reviewed the data as listed Lab Results  Component Value  Date   WBC 5.3 06/19/2022   HGB 12.2 06/19/2022   HCT 38.7 06/19/2022   MCV 82.2 06/19/2022   PLT 245 06/19/2022   Lab Results  Component Value Date   NA 139 06/19/2022   K 4.3 06/19/2022   CL 107 06/19/2022   CO2 27 06/19/2022    RADIOGRAPHIC STUDIES: I have personally reviewed the radiological reports and agreed with the findings in the report.  ASSESSMENT AND PLAN:  Malignant neoplasm of upper-outer quadrant of left breast in female, estrogen receptor positive ([place]) This is a very pleasant 52 year old female patient with newly diagnosed left breast invasive lobular carcinoma, grade 1, ER/PR positive HER2 negative Ki-67 of 20% referred to breast [place] for additional recommendations.  Given low-grade tumor, lobular histology and a strong ER/PR positivity she will proceed with upfront surgery first followed by Oncotype testing.  We have discussed  the following details about Oncotype.  We have discussed about Oncotype Dx score which is a well validated prognostic scoring system which can predict outcome with endocrine therapy alone and whether chemotherapy reduces recurrence.  Typically in patients with ER positive cancers that are node negative if the RS score is high typically greater than or equal to 26, chemotherapy is recommended.  In women with intermediate recurrence score younger than 14, there can still be some role for chemotherapy in addition to endocrine therapy especially if the recurrence score is between 21-25. If chemotherapy is needed, this will precede radiation and then after radiation she will continue on antiestrogen therapy.  If she does not need adjuvant chemotherapy, she will proceed with adjuvant radiation followed by antiestrogen therapy.  Given her premenopausal status we have discussed about tamoxifen today as her choice of antiestrogen therapy.  We have discussed options for antiestrogen therapy today  With regards to Tamoxifen, we discussed that this is a SERM, selective estrogen receptor modulator. We discussed mechanism of action of Tamoxifen, adverse effects on Tamoxifen including but not limited to post menopausal symptoms, increased risk of DVT/PE, increased risk of endometrial cancer, questionable cataracts with long term use and increased risk of cardiovascular events in the study which was not statistically significant. A benefit from Tamoxifen would be improvement in bone density. With regards to aromatase inhibitors, we discussed mechanism of action, adverse effects including but not limited to post menopausal symptoms, arthralgias, myalgias, increased risk of cardiovascular events and bone loss.   She understands importance of antiestrogen therapy.  She will return to clinic after surgery.  All her questions were answered to the best of my knowledge.  Thank you for consulting [place] in the care of this  patient.  Please not hesitate to contact us with any additional questions or concerns.     Total time spent: 60 minutes including history, physical exam, review of records, counseling and coordination of care All questions were answered. The patient knows to call the clinic with any problems, questions or concerns.    Benay Pike, MD 06/19/22

## 2022-06-19 NOTE — Assessment & Plan Note (Signed)
This is a very pleasant 52 year old female patient with newly diagnosed left breast invasive lobular carcinoma, grade 1, ER/PR positive HER2 negative Ki-67 of 20% referred to breast [place] for additional recommendations.  Given low-grade tumor, lobular histology and a strong ER/PR positivity she will proceed with upfront surgery first followed by Oncotype testing.  We have discussed the following details about Oncotype.  We have discussed about Oncotype Dx score which is a well validated prognostic scoring system which can predict outcome with endocrine therapy alone and whether chemotherapy reduces recurrence.  Typically in patients with ER positive cancers that are node negative if the RS score is high typically greater than or equal to 26, chemotherapy is recommended.  In women with intermediate recurrence score younger than 60, there can still be some role for chemotherapy in addition to endocrine therapy especially if the recurrence score is between 21-25. If chemotherapy is needed, this will precede radiation and then after radiation she will continue on antiestrogen therapy.  If she does not need adjuvant chemotherapy, she will proceed with adjuvant radiation followed by antiestrogen therapy.  Given her premenopausal status we have discussed about tamoxifen today as her choice of antiestrogen therapy.  We have discussed options for antiestrogen therapy today  With regards to Tamoxifen, we discussed that this is a SERM, selective estrogen receptor modulator. We discussed mechanism of action of Tamoxifen, adverse effects on Tamoxifen including but not limited to post menopausal symptoms, increased risk of DVT/PE, increased risk of endometrial cancer, questionable cataracts with long term use and increased risk of cardiovascular events in the study which was not statistically significant. A benefit from Tamoxifen would be improvement in bone density. With regards to aromatase inhibitors, we discussed  mechanism of action, adverse effects including but not limited to post menopausal symptoms, arthralgias, myalgias, increased risk of cardiovascular events and bone loss.   She understands importance of antiestrogen therapy.  She will return to clinic after surgery.  All her questions were answered to the best of my knowledge.  Thank you for consulting [place] in the care of this patient.  Please not hesitate to contact us with any additional questions or concerns.

## 2022-06-19 NOTE — Therapy (Signed)
OUTPATIENT PHYSICAL THERAPY BREAST CANCER BASELINE EVALUATION   Patient Name: Joanne Wilson MRN: [id number] DOB:July 08, 1970, 52 y.o., female Today's Date: 06/19/2022  END OF SESSION:  PT End of Session - 06/19/22 0945     Visit Number 1    Number of Visits 2    Date for PT Re-Evaluation 08/14/22    PT Start Time 1054    PT Stop Time [id number]    PT Time Calculation (min) 42 min    Activity Tolerance Patient tolerated treatment well    Behavior During Therapy WFL for tasks assessed/performed             Past Medical History:  Diagnosis Date   Carpal tunnel syndrome    Family history of breast cancer    Finger fracture, left    5th digit (small finger)   GERD (gastroesophageal reflux disease)    PONV (postoperative nausea and vomiting)    PONV (postoperative nausea and vomiting) 08/10/2019   Varicose vein of leg    Past Surgical History:  Procedure Laterality Date   ABDOMINAL HYSTERECTOMY     AUGMENTATION MAMMAPLASTY Bilateral 2014   BREAST BIOPSY Left 06/11/2022   [place] LT BREAST BX W LOC DEV 1ST LESION IMG BX [place] US GUIDE 06/11/2022 [facility]   BREAST BIOPSY Left 06/11/2022   [place] LT BREAST BX W LOC DEV EA ADD LESION IMG BX SPEC US GUIDE 06/11/2022 [facility]   LAPAROSCOPIC VAGINAL HYSTERECTOMY WITH SALPINGECTOMY Bilateral 02/25/2018   Procedure: LAPAROSCOPIC ASSISTED VAGINAL HYSTERECTOMY WITH SALPINGECTOMY;  Surgeon: Joanne Lose, MD;  Location: [facility];  Service: Gynecology;  Laterality: Bilateral;   OPEN REDUCTION INTERNAL FIXATION (ORIF) METACARPAL Left 07/10/2017   Procedure: OPEN REDUCTION INTERNAL FIXATION (ORIF) METACARPAL;  Surgeon: Joanne Planas, MD;  Location: [facility];  Service: Orthopedics;  Laterality: Left;   TUBAL LIGATION Bilateral 2005   PPTL   VARICOSE VEIN SURGERY Bilateral    surgery years ago and sclerothraphy ~ 2015   Patient Active Problem List   Diagnosis Date Noted   Malignant neoplasm of upper-outer quadrant of  left breast in female, estrogen receptor positive ([place]) 06/18/2022   Breast implant status 02/15/2022   Family history of breast cancer gene mutation in first degree relative 02/15/2022   Radiculopathy 08/10/2019   Carpal tunnel syndrome, left 08/10/2019   GERD (gastroesophageal reflux disease) 08/10/2019   S/P laparoscopic assisted vaginal hysterectomy (LAVH) 02/25/2018   Family history of breast cancer     REFERRING PROVIDER: Dr. Erroll Wilson  REFERRING DIAG: Left breast cancer  THERAPY DIAG:  Malignant neoplasm of upper-outer quadrant of left breast in female, estrogen receptor positive ([place])  Abnormal posture  Rationale for Evaluation and Treatment: Rehabilitation  ONSET DATE: 05/01/2022  SUBJECTIVE:  SUBJECTIVE STATEMENT: Patient reports she is here today to be seen by her medical team for her newly diagnosed left breast cancer.   PERTINENT HISTORY:  Patient was diagnosed on 05/01/2022 with left grade 1 invasive lobular carcinoma breast cancer. It measures 1.6 cm and 6 mm and is located in the upper outer quadrant. It is ER/PR positive and HER2 negative with a Ki67 of 10%.   PATIENT GOALS:   reduce lymphedema risk and learn post op HEP.   PAIN:  Are you having pain? No  PRECAUTIONS: Active CA   HAND DOMINANCE: left  WEIGHT BEARING RESTRICTIONS: No  FALLS:  Has patient fallen in last 6 months? No  LIVING ENVIRONMENT: Patient lives with: herself Lives in: House/apartment Has following equipment at home: None  OCCUPATION: CMA at the [employer]: She has not been exercising but recently began using an Elliptical for 15 min and doing some weights at the [place]: Independent   OBJECTIVE:  COGNITION: Overall cognitive status: Within  functional limits for tasks assessed    POSTURE:  Forward head and rounded shoulders posture  UPPER EXTREMITY AROM/PROM:  A/PROM RIGHT   eval   Shoulder extension 53  Shoulder flexion 159  Shoulder abduction 167  Shoulder internal rotation 66  Shoulder external rotation 83    (Blank rows = not tested)  A/PROM LEFT   eval  Shoulder extension 54  Shoulder flexion 148  Shoulder abduction 173  Shoulder internal rotation 53  Shoulder external rotation 84    (Blank rows = not tested)  CERVICAL AROM: All within normal limits  UPPER EXTREMITY STRENGTH: WFL  LYMPHEDEMA ASSESSMENTS:   LANDMARK RIGHT   eval  10 cm proximal to olecranon process 32.8  Olecranon process 26.7  10 cm proximal to ulnar styloid process 23  Just proximal to ulnar styloid process 16.5  Across hand at thumb web space 18.5  At base of 2nd digit 5.9  (Blank rows = not tested)  LANDMARK LEFT   eval  10 cm proximal to olecranon process 34  Olecranon process 28.4  10 cm proximal to ulnar styloid process 22.1  Just proximal to ulnar styloid process 16.5  Across hand at thumb web space 19.7  At base of 2nd digit 5.9  (Blank rows = not tested)  L-DEX LYMPHEDEMA SCREENING:  The patient was assessed using the L-Dex machine today to produce a lymphedema index baseline score. The patient will be reassessed on a regular basis (typically every 3 months) to obtain new L-Dex scores. If the score is > 6.5 points away from his/her baseline score indicating onset of subclinical lymphedema, it will be recommended to wear a compression garment for 4 weeks, 12 hours per day and then be reassessed. If the score continues to be > 6.5 points from baseline at reassessment, we will initiate lymphedema treatment. Assessing in this manner has a 95% rate of preventing clinically significant lymphedema.   L-DEX FLOWSHEETS - 06/19/22 0900       L-DEX LYMPHEDEMA SCREENING   Measurement Type Unilateral    L-DEX MEASUREMENT  EXTREMITY Upper Extremity    POSITION  Standing    DOMINANT SIDE Left    At Risk Side Left    BASELINE SCORE (UNILATERAL) -5.6             QUICK DASH SURVEY:  Joanne Wilson - 06/19/22 0001     Open a tight or new jar Mild difficulty  Do heavy household chores (wash walls, wash floors) No difficulty    Carry a shopping bag or briefcase No difficulty    Wash your back No difficulty    Use a knife to cut food No difficulty    Recreational activities in which you take some force or impact through your arm, shoulder, or hand (golf, hammering, tennis) No difficulty    During the past week, to what extent has your arm, shoulder or hand problem interfered with your normal social activities with family, friends, neighbors, or groups? Not at all    During the past week, to what extent has your arm, shoulder or hand problem limited your work or other regular daily activities Not at all    Arm, shoulder, or hand pain. Mild    Tingling (pins and needles) in your arm, shoulder, or hand Moderate    Difficulty Sleeping Mild difficulty    DASH Score 11.36 %              PATIENT EDUCATION:  Education details: Lymphedema risk reduction and post op shoulder/posture HEP Person educated: Patient Education method: Explanation, Demonstration, Handout Education comprehension: Patient verbalized understanding and returned demonstration  HOME EXERCISE PROGRAM: Patient was instructed today in a home exercise program today for post op shoulder range of motion. These included active assist shoulder flexion in sitting, scapular retraction, wall walking with shoulder abduction, and hands behind head external rotation.  She was encouraged to do these twice a day, holding 3 seconds and repeating 5 times when permitted by her physician.   ASSESSMENT:  CLINICAL IMPRESSION: Patient was diagnosed on 05/01/2022 with left grade 1 invasive lobular carcinoma breast cancer. It measures 1.6 cm and 6 mm and is  located in the upper outer quadrant. It is ER/PR positive and HER2 negative with a Ki67 of 10%. Her multidisciplinary medical team met prior to her assessments to determine a recommended treatment plan. She is planning to have a left lumpectomy and sentinel node biopsy followed by Oncotype testing, radiation, and anti-estrogen therapy. She will benefit from a post op PT reassessment to determine needs and from L-Dex screens every 3 months for 2 years to detect subclinical lymphedema.  Pt will benefit from skilled therapeutic intervention to improve on the following deficits: Decreased knowledge of precautions, impaired UE functional use, pain, decreased ROM, postural dysfunction.   PT treatment/interventions: ADL/self-care home management, pt/family education, therapeutic exercise  REHAB POTENTIAL: Excellent  CLINICAL DECISION MAKING: Stable/uncomplicated  EVALUATION COMPLEXITY: Low   GOALS: Goals reviewed with patient? YES  LONG TERM GOALS: (STG=LTG)    Name Target Date Goal status  1 Pt will be able to verbalize understanding of pertinent lymphedema risk reduction practices relevant to her dx specifically related to skin care.  Baseline:  No knowledge 06/19/2022 Achieved at eval  2 Pt will be able to return demo and/or verbalize understanding of the post op HEP related to regaining shoulder ROM. Baseline:  No knowledge 06/19/2022 Achieved at eval  3 Pt will be able to verbalize understanding of the importance of attending the post op After Breast CA Class for further lymphedema risk reduction education and therapeutic exercise.  Baseline:  No knowledge 06/19/2022 Achieved at eval  4 Pt will demo she has regained full shoulder ROM and function post operatively compared to baselines.  Baseline: See objective measurements taken today. 08/14/2022     PLAN:  PT FREQUENCY/DURATION: EVAL and 1 follow up appointment.   PLAN FOR NEXT SESSION: will reassess 3-4 weeks  post op to determine  needs.   Patient will follow up at outpatient cancer rehab 3-4 weeks following surgery.  If the patient requires physical therapy at that time, a specific plan will be dictated and sent to the referring physician for approval. The patient was educated today on appropriate basic range of motion exercises to begin post operatively and the importance of attending the After Breast Cancer class following surgery.  Patient was educated today on lymphedema risk reduction practices as it pertains to recommendations that will benefit the patient immediately following surgery.  She verbalized good understanding.    Physical Therapy Information for After Breast Cancer Surgery/Treatment:  Lymphedema is a swelling condition that you may be at risk for in your arm if you have lymph nodes removed from the armpit area.  After a sentinel node biopsy, the risk is approximately 5-9% and is higher after an axillary node dissection.  There is treatment available for this condition and it is not life-threatening.  Contact your physician or physical therapist with concerns. You may begin the 4 shoulder/posture exercises (see additional sheet) when permitted by your physician (typically a week after surgery).  If you have drains, you may need to wait until those are removed before beginning range of motion exercises.  A general recommendation is to not lift your arms above shoulder height until drains are removed.  These exercises should be done to your tolerance and gently.  This is not a "no pain/no gain" type of recovery so listen to your body and stretch into the range of motion that you can tolerate, stopping if you have pain.  If you are having immediate reconstruction, ask your plastic surgeon about doing exercises as he or she may want you to wait. We encourage you to attend the free one time ABC (After Breast Cancer) class offered by [facility].  You will learn information related to lymphedema  risk, prevention and treatment and additional exercises to regain mobility following surgery.  You can call [phone] for more information.  This is offered the 1st and 3rd Monday of each month.  You only attend the class one time. While undergoing any medical procedure or treatment, try to avoid blood pressure being taken or needle sticks from occurring on the arm on the side of cancer.   This recommendation begins after surgery and continues for the rest of your life.  This may help reduce your risk of getting lymphedema (swelling in your arm). An excellent resource for those seeking information on lymphedema is the National Lymphedema Network's web site. It can be accessed at [place].org If you notice swelling in your hand, arm or breast at any time following surgery (even if it is many years from now), please contact your doctor or physical therapist to discuss this.  Lymphedema can be treated at any time but it is easier for you if it is treated early on.  If you feel like your shoulder motion is not returning to normal in a reasonable amount of time, please contact your surgeon or physical therapist.  [facility] [phone]. [address], [place] [address]  ABC CLASS After Breast Cancer Class  After Breast Cancer Class is a specially designed exercise class to assist you in a safe recover after having breast cancer surgery.  In this class you will learn how to get back to full function whether your drains were just removed or if you had surgery a  month ago.  This one-time class is held the 1st and 3rd Monday of every month from 11:00 a.m. until 12:00 noon virtually.  This class is FREE and space is limited. For more information or to register for the next available class, call [phone].  Class Goals  Understand specific stretches to improve the flexibility of you chest and shoulder. Learn ways to safely strengthen your upper  body and improve your posture. Understand the warning signs of infection and why you may be at risk for an arm infection. Learn about Lymphedema and prevention.  ** You do not attend this class until after surgery.  Drains must be removed to participate  Patient was instructed today in a home exercise program today for post op shoulder range of motion. These included active assist shoulder flexion in sitting, scapular retraction, wall walking with shoulder abduction, and hands behind head external rotation.  She was encouraged to do these twice a day, holding 3 seconds and repeating 5 times when permitted by her physician.  Annia Friendly, [place] 06/19/22 11:43 AM

## 2022-06-19 NOTE — Progress Notes (Signed)
[place] Work  Initial Assessment   Joanne Wilson is a 52 y.o. year old female accompanied by mother and daughter (Da'Mya). Clinical Social Work was referred by  [facility]  for assessment of psychosocial needs.   SDOH (Social Determinants of Health) assessments performed: Yes SDOH Interventions    Flowsheet Row Clinical Support from 06/19/2022 in [place] at [facility]  SDOH Interventions   Food Insecurity Interventions Intervention Not Indicated  Housing Interventions Intervention Not Indicated  Transportation Interventions Intervention Not Indicated  Utilities Interventions Intervention Not Indicated  Financial Strain Interventions Intervention Not Indicated       SDOH Screenings   Food Insecurity: No Food Insecurity (06/19/2022)  Housing: Low Risk  (06/19/2022)  Transportation Needs: No Transportation Needs (06/19/2022)  Utilities: Not At Risk (06/19/2022)  Depression (PHQ2-9): Low Risk  (02/15/2022)  Financial Resource Strain: Low Risk  (06/19/2022)  Tobacco Use: Low Risk  (06/19/2022)     Distress Screen completed: Yes    06/19/2022   11:23 AM  ONCBCN DISTRESS SCREENING  Screening Type Initial Screening  Distress experienced in past week (1-10) 5      Family/Social Information:  Housing Arrangement: patient lives alone. 71 yo daughter at home during breaks from college (attends [employer]). Boyfriend lives nearby. 2 other adult children Family members/support persons in your life? 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 yes Patient reported stressors:   general adjusting to diagnosis Patient enjoys time with family/ friends and balloon business, movies Current coping skills/ strengths: Active sense of humor , Capable of independent living , Communication skills , Motivation for treatment/growth , Special hobby/interest , and Supportive family/friends     SUMMARY: Current SDOH Barriers:  No major barriers noted today  Clinical Social Work Clinical Goal(s):  No clinical social work goals at this time  Interventions: Discussed common feeling and emotions when being diagnosed with cancer, and the importance of support during treatment Informed patient of the support team roles and support services at [facility] Provided [facility] contact information and encouraged patient to call with any questions or concerns   Follow Up Plan: Patient will contact CSW with any support or resource needs Patient verbalizes understanding of plan: Yes    Marshon Bangs E Eryca Bolte, LCSW

## 2022-06-21 ENCOUNTER — Telehealth: Payer: Self-pay | Admitting: Hematology and Oncology

## 2022-06-21 NOTE — Telephone Encounter (Signed)
Left patient a vm regarding appointment scheduled

## 2022-06-24 ENCOUNTER — Encounter: Payer: Self-pay | Admitting: Genetic Counselor

## 2022-06-24 ENCOUNTER — Other Ambulatory Visit: Payer: Self-pay | Admitting: Surgery

## 2022-06-24 ENCOUNTER — Telehealth: Payer: Self-pay | Admitting: Emergency Medicine

## 2022-06-24 DIAGNOSIS — Z1379 Encounter for other screening for genetic and chromosomal anomalies: Secondary | ICD-10-CM | POA: Insufficient documentation

## 2022-06-24 DIAGNOSIS — C50912 Malignant neoplasm of unspecified site of left female breast: Secondary | ICD-10-CM

## 2022-06-24 NOTE — Progress Notes (Signed)
Joanne Wilson was seen by a genetic counselor during the breast multidisciplinary clinic on 06/19/2022.  She previously had genetic counseling for hereditary cancer risks in January 2024 and November 2017 and declined testing at the time.   Joanne Wilson meets NCCN criteria for genetic testing based on personal and family history of breast cancer. We reviewed the implications of genetic testings to inform personal and family cancer risks.  We also discussed implications for positive genetic results to inform surgical options related to breast cancer management.   Patient declined genetic testing at this time, stating that it would not impact her personal surgery decisions.  She stated she does not wish to have testing at this time but may want to proceed with testing after treatment.   Joanne Wilson mother, who has a history of bilateral and metastatic breast cancer, was also at appointment.  Recommended genetic testing for Joanne Wilson's mother.   Contact information provided.  She knows she is welcome to contact Genetics at any point in the future.     Brier Reid M. Joette Catching, [place], [facility] Genetic Counselor [email or web address] (P) [phone]

## 2022-06-24 NOTE — Telephone Encounter (Signed)
[employer] 2021-05 - Specimen Collection Study to Evaluate Biomarkers in Subjects with Cancer   Called to f/u on patient interest in study.  No answer.  Left VM requesting call back.  Will attempt to f/u again in next few days.  Wells Guiles 'Learta Codding' Neysa Bonito, RN, BSN, [facility] Clinical Research Nurse I 06/24/22 11:32 AM

## 2022-06-26 ENCOUNTER — Telehealth: Payer: Self-pay | Admitting: *Deleted

## 2022-06-26 ENCOUNTER — Encounter: Payer: Self-pay | Admitting: *Deleted

## 2022-06-26 NOTE — Telephone Encounter (Signed)
Left message for a return phone call to follow up from [facility] and assess navigation needs. 

## 2022-06-27 ENCOUNTER — Telehealth: Payer: Self-pay | Admitting: Emergency Medicine

## 2022-06-27 NOTE — Telephone Encounter (Signed)
[employer] 2021-05 - Specimen Collection Study to Evaluate Biomarkers in Subjects with Cancer   Called to f/u on interest in study.  Patient is going to check her schedule and contact us to discuss scheduling labs/consent appt but she is interested.  Will f/u with her in next few days.  Joanne Guiles 'Learta Codding' Neysa Bonito, RN, BSN, [facility] Clinical Research Nurse I 06/27/22 3:29 PM

## 2022-07-04 ENCOUNTER — Telehealth: Payer: Self-pay | Admitting: *Deleted

## 2022-07-04 NOTE — Telephone Encounter (Signed)
[employer] 2021-05 - Specimen Collection Study to Evaluate Biomarkers in Subjects with Cancer   Called patient to follow up on the above study and see if she has any questions. Patient states she has decided to not participate due to being very busy and having a lot of other appointments. Thanked patient for taking the time to speak with research and Dr. Chryl Heck notified.  Foye Spurling, BSN, RN, [place] Nurse II [phone] 07/04/2022 1:33 PM

## 2022-07-08 ENCOUNTER — Encounter (HOSPITAL_COMMUNITY): Payer: Self-pay

## 2022-07-08 ENCOUNTER — Encounter (HOSPITAL_COMMUNITY)
Admission: RE | Admit: 2022-07-08 | Discharge: 2022-07-08 | Disposition: A | Discharge status: Home / Self Care | Payer: 59 | Source: Ambulatory Visit | Attending: Surgery | Admitting: Surgery

## 2022-07-08 ENCOUNTER — Other Ambulatory Visit: Payer: Self-pay

## 2022-07-08 HISTORY — DX: Anemia, unspecified: D64.9

## 2022-07-08 HISTORY — DX: COVID-19: U07.1

## 2022-07-08 HISTORY — DX: Malignant (primary) neoplasm, unspecified: C80.1

## 2022-07-08 NOTE — Progress Notes (Signed)
PCP - Alysia Penna, NP  Chest x-ray - N/A EKG - N/A  Labs done 06/19/22 (CBC and CMP)  ERAS Protocol - yes, no drink  Anesthesia review: No  Patient denies shortness of breath, fever, cough and chest pain at PAT appointment   All instructions explained to the patient, with a verbal understanding of the material. Patient agrees to go over the instructions while at home for a better understanding. Patient also instructed to self quarantine after being tested for COVID-19. The opportunity to ask questions was provided.

## 2022-07-08 NOTE — Progress Notes (Addendum)
Surgical Instructions    Your procedure is scheduled on July 16, 2022 at 2:00 PM.  Report to [facility] Main Entrance "A" at 12 noon, then check in with the Admitting office.  Call this number if you have problems the morning of surgery:  [phone]   If you have any questions prior to your surgery date call [phone]: Open Monday-Friday 8am-4pm If you experience any cold or flu symptoms such as cough, fever, chills, shortness of breath, etc. between now and your scheduled surgery, please notify us at the above number     Remember:  Do not eat after midnight the night before your surgery  You may drink clear liquids until 11:00 AM the morning of your surgery.   Clear liquids allowed are: Water, Non-Citrus Juices (without pulp), Carbonated Beverages, Clear Tea, Black Coffee ONLY (NO MILK, CREAM OR POWDERED CREAMER of any kind), and Gatorade    Take these medicines the morning of surgery with A SIP OF WATER: NONE  As of today, STOP taking any Aspirin (unless otherwise instructed by your surgeon) Aleve, Naproxen, Ibuprofen, Motrin, Advil, Goody's, BC's, all herbal medications, fish oil and all vitamins.         [facility] is not responsible for any belongings or valuables.    Contacts, glasses, hearing aids, dentures or partials may not be worn into surgery, please bring cases for these belongings   Patients discharged the day of surgery will not be allowed to drive home, and someone needs to stay with them for 24 hours.   SURGICAL WAITING ROOM VISITATION Patients having surgery or a procedure may have no more than 2 support people in the waiting area - these visitors may rotate.   Children under the age of 58 must have an adult with them who is not the patient. If the patient needs to stay at the hospital during part of their recovery, the visitor guidelines for inpatient rooms apply. Pre-op nurse will coordinate an appropriate time for 1 support person to accompany patient in  pre-op.  This support person may not rotate.   Special instructions:    Oral Hygiene is also important to reduce your risk of infection.  Remember - BRUSH YOUR TEETH THE MORNING OF SURGERY WITH YOUR REGULAR TOOTHPASTE  [place]- Preparing For Surgery  Before surgery, you can play an important role. Because skin is not sterile, your skin needs to be as free of germs as possible. You can reduce the number of germs on your skin by washing with CHG (chlorahexidine gluconate) Soap before surgery.  CHG is an antiseptic cleaner which kills germs and bonds with the skin to continue killing germs even after washing.     Please do not use if you have an allergy to CHG or antibacterial soaps. If your skin becomes reddened/irritated stop using the CHG.  Do not shave (including legs and underarms) for at least 48 hours prior to first CHG shower. It is OK to shave your face.  Please follow these instructions carefully.    Shower the NIGHT BEFORE SURGERY and the MORNING OF SURGERY with CHG Soap.   If you chose to wash your hair, wash your hair first as usual with your normal shampoo. After you shampoo, rinse your hair and body thoroughly to remove the shampoo.  Then [employer] and genitals (private parts) with your normal soap and rinse thoroughly to remove soap.  After that Use CHG Soap as you would any other liquid soap. You can apply  CHG directly to the skin and wash gently with a scrungie or a clean washcloth.   Apply the CHG Soap to your body ONLY FROM THE NECK DOWN.  Do not use on open wounds or open sores. Avoid contact with your eyes, ears, mouth and genitals (private parts). Wash Face and genitals (private parts)  with your normal soap.   Wash thoroughly, paying special attention to the area where your surgery will be performed.  Thoroughly rinse your body with warm water from the neck down.  DO NOT shower/wash with your normal soap after using and rinsing off the CHG Soap.  Pat yourself dry  with a CLEAN TOWEL.  Wear CLEAN PAJAMAS to bed the night before surgery  Place CLEAN SHEETS on your bed the night before your surgery  DO NOT SLEEP WITH PETS.  Day of Surgery: Take a shower with CHG soap. Wear Clean/Comfortable clothing the morning of surgery Do not wear jewelry or makeup. Do not wear lotions, powders, perfumes or deodorant. Do not shave 48 hours prior to surgery. Do not bring valuables to the hospital. Do not wear nail polish, gel polish, artificial nails, or any other type of covering on natural nails (fingers and toes) If you have artificial nails or gel coating that need to be removed by a nail salon, please have this removed prior to surgery. Artificial nails or gel coating may interfere with  anesthesia's ability to adequately monitor your vital signs.  Please read over the fact sheets that you were given.

## 2022-07-15 ENCOUNTER — Ambulatory Visit
Admission: RE | Admit: 2022-07-15 | Discharge: 2022-07-15 | Disposition: A | Discharge status: Home / Self Care | Payer: 59 | Source: Ambulatory Visit | Attending: Surgery | Admitting: Surgery

## 2022-07-15 DIAGNOSIS — C50912 Malignant neoplasm of unspecified site of left female breast: Secondary | ICD-10-CM

## 2022-07-15 DIAGNOSIS — R928 Other abnormal and inconclusive findings on diagnostic imaging of breast: Secondary | ICD-10-CM | POA: Diagnosis not present

## 2022-07-15 HISTORY — PX: BREAST BIOPSY: SHX20

## 2022-07-16 ENCOUNTER — Encounter (HOSPITAL_COMMUNITY): Payer: Self-pay | Admitting: Surgery

## 2022-07-16 ENCOUNTER — Ambulatory Visit (HOSPITAL_COMMUNITY): Payer: 59 | Admitting: Anesthesiology

## 2022-07-16 ENCOUNTER — Encounter (HOSPITAL_COMMUNITY)
Admission: RE | Disposition: A | Discharge status: Home / Self Care | Payer: Self-pay | Source: Home / Self Care | Attending: Surgery

## 2022-07-16 ENCOUNTER — Ambulatory Visit (HOSPITAL_COMMUNITY)
Admission: RE | Admit: 2022-07-16 | Discharge: 2022-07-16 | Disposition: A | Discharge status: Home / Self Care | Payer: 59 | Attending: Surgery | Admitting: Surgery

## 2022-07-16 ENCOUNTER — Other Ambulatory Visit: Payer: Self-pay

## 2022-07-16 ENCOUNTER — Ambulatory Visit
Admission: RE | Admit: 2022-07-16 | Discharge: 2022-07-16 | Disposition: A | Discharge status: Home / Self Care | Payer: 59 | Source: Ambulatory Visit | Attending: Surgery | Admitting: Surgery

## 2022-07-16 ENCOUNTER — Ambulatory Visit (HOSPITAL_BASED_OUTPATIENT_CLINIC_OR_DEPARTMENT_OTHER): Payer: 59 | Admitting: Anesthesiology

## 2022-07-16 DIAGNOSIS — C50912 Malignant neoplasm of unspecified site of left female breast: Secondary | ICD-10-CM

## 2022-07-16 DIAGNOSIS — Z170 Estrogen receptor positive status [ER+]: Secondary | ICD-10-CM | POA: Diagnosis not present

## 2022-07-16 DIAGNOSIS — R928 Other abnormal and inconclusive findings on diagnostic imaging of breast: Secondary | ICD-10-CM | POA: Diagnosis not present

## 2022-07-16 DIAGNOSIS — K219 Gastro-esophageal reflux disease without esophagitis: Secondary | ICD-10-CM | POA: Diagnosis not present

## 2022-07-16 DIAGNOSIS — C50412 Malignant neoplasm of upper-outer quadrant of left female breast: Secondary | ICD-10-CM | POA: Diagnosis not present

## 2022-07-16 DIAGNOSIS — D569 Thalassemia, unspecified: Secondary | ICD-10-CM | POA: Insufficient documentation

## 2022-07-16 HISTORY — PX: BREAST LUMPECTOMY WITH RADIOACTIVE SEED AND SENTINEL LYMPH NODE BIOPSY: SHX6550

## 2022-07-16 SURGERY — BREAST LUMPECTOMY WITH RADIOACTIVE SEED AND SENTINEL LYMPH NODE BIOPSY
Anesthesia: Regional | Site: Breast | Laterality: Left

## 2022-07-16 MED ORDER — DEXAMETHASONE SODIUM PHOSPHATE 10 MG/ML IJ SOLN
INTRAMUSCULAR | Status: DC | PRN
  Administered 2022-07-16: 10 mg via INTRAVENOUS

## 2022-07-16 MED ORDER — ORAL CARE MOUTH RINSE: 15.0000 mL | Freq: Once | OROMUCOSAL | Status: AC

## 2022-07-16 MED ORDER — BUPIVACAINE HCL (PF) 0.25 % IJ SOLN
INTRAMUSCULAR | Status: AC
  Filled 2022-07-16: qty 30

## 2022-07-16 MED ORDER — GLYCOPYRROLATE PF 0.2 MG/ML IJ SOSY
PREFILLED_SYRINGE | INTRAMUSCULAR | Status: DC | PRN
  Administered 2022-07-16: .1 mg via INTRAVENOUS

## 2022-07-16 MED ORDER — CEFAZOLIN IN SODIUM CHLORIDE 3-0.9 GM/100ML-% IV SOLN
INTRAVENOUS | Status: AC
  Filled 2022-07-16: qty 100

## 2022-07-16 MED ORDER — AMISULPRIDE (ANTIEMETIC) 5 MG/2ML IV SOLN: 10.0000 mg | Freq: Once | INTRAVENOUS | Status: DC | PRN

## 2022-07-16 MED ORDER — BUPIVACAINE HCL (PF) 0.25 % IJ SOLN
INTRAMUSCULAR | Status: DC | PRN
  Administered 2022-07-16: 24 mL

## 2022-07-16 MED ORDER — CHLORHEXIDINE GLUCONATE CLOTH 2 % EX PADS: 6.0000 | MEDICATED_PAD | Freq: Once | CUTANEOUS | Status: DC

## 2022-07-16 MED ORDER — LACTATED RINGERS IV SOLN: INTRAVENOUS | Status: DC

## 2022-07-16 MED ORDER — MAGTRACE LYMPHATIC TRACER
INTRAMUSCULAR | Status: DC | PRN
  Administered 2022-07-16: 2 mL via INTRAMUSCULAR

## 2022-07-16 MED ORDER — ONDANSETRON HCL 4 MG/2ML IJ SOLN
INTRAMUSCULAR | Status: DC | PRN
  Administered 2022-07-16: 4 mg via INTRAVENOUS

## 2022-07-16 MED ORDER — CEFAZOLIN SODIUM-DEXTROSE 2-4 GM/100ML-% IV SOLN
2.0000 g | INTRAVENOUS | Status: AC
  Administered 2022-07-16: 2 g via INTRAVENOUS

## 2022-07-16 MED ORDER — FENTANYL CITRATE (PF) 250 MCG/5ML IJ SOLN
INTRAMUSCULAR | Status: DC | PRN
  Administered 2022-07-16 (×2): 50 ug via INTRAVENOUS

## 2022-07-16 MED ORDER — OXYCODONE HCL 5 MG PO TABS
5.0000 mg | ORAL_TABLET | Freq: Four times a day (QID) | ORAL | 0 refills | Status: DC | PRN
Start: 2022-07-16 — End: 2022-09-04

## 2022-07-16 MED ORDER — FENTANYL CITRATE (PF) 250 MCG/5ML IJ SOLN
INTRAMUSCULAR | Status: AC
  Filled 2022-07-16: qty 5

## 2022-07-16 MED ORDER — OXYCODONE HCL 5 MG/5ML PO SOLN: 5.0000 mg | Freq: Once | ORAL | Status: DC | PRN

## 2022-07-16 MED ORDER — GABAPENTIN 300 MG PO CAPS: 300.0000 mg | ORAL_CAPSULE | ORAL | Status: AC

## 2022-07-16 MED ORDER — LIDOCAINE 2% (20 MG/ML) 5 ML SYRINGE
INTRAMUSCULAR | Status: DC | PRN
  Administered 2022-07-16: 20 mg via INTRAVENOUS

## 2022-07-16 MED ORDER — FENTANYL CITRATE (PF) 100 MCG/2ML IJ SOLN: 100.0000 ug | Freq: Once | INTRAMUSCULAR | Status: AC

## 2022-07-16 MED ORDER — BUPIVACAINE HCL (PF) 0.25 % IJ SOLN
INTRAMUSCULAR | Status: DC | PRN
  Administered 2022-07-16: 30 mL via PERINEURAL

## 2022-07-16 MED ORDER — 0.9 % SODIUM CHLORIDE (POUR BTL) OPTIME
TOPICAL | Status: DC | PRN
  Administered 2022-07-16: 1000 mL

## 2022-07-16 MED ORDER — PHENYLEPHRINE 80 MCG/ML (10ML) SYRINGE FOR IV PUSH (FOR BLOOD PRESSURE SUPPORT)
PREFILLED_SYRINGE | INTRAVENOUS | Status: DC | PRN
  Administered 2022-07-16 (×3): 160 ug via INTRAVENOUS
  Administered 2022-07-16: 80 ug via INTRAVENOUS

## 2022-07-16 MED ORDER — MIDAZOLAM HCL 2 MG/2ML IJ SOLN: 2.0000 mg | Freq: Once | INTRAMUSCULAR | Status: AC

## 2022-07-16 MED ORDER — MIDAZOLAM HCL 2 MG/2ML IJ SOLN
INTRAMUSCULAR | Status: DC | PRN
  Administered 2022-07-16: 1 mg via INTRAVENOUS

## 2022-07-16 MED ORDER — MIDAZOLAM HCL 2 MG/2ML IJ SOLN
INTRAMUSCULAR | Status: AC
  Filled 2022-07-16: qty 2

## 2022-07-16 MED ORDER — GABAPENTIN 300 MG PO CAPS
ORAL_CAPSULE | ORAL | Status: AC
  Administered 2022-07-16: 300 mg via ORAL
  Filled 2022-07-16: qty 1

## 2022-07-16 MED ORDER — CHLORHEXIDINE GLUCONATE 0.12 % MT SOLN: 15.0000 mL | Freq: Once | OROMUCOSAL | Status: AC

## 2022-07-16 MED ORDER — OXYCODONE HCL 5 MG PO TABS: 5.0000 mg | ORAL_TABLET | Freq: Once | ORAL | Status: DC | PRN

## 2022-07-16 MED ORDER — PROPOFOL 10 MG/ML IV BOLUS
INTRAVENOUS | Status: DC | PRN
  Administered 2022-07-16: 200 mg via INTRAVENOUS
  Administered 2022-07-16: 125 ug/kg/min via INTRAVENOUS
  Administered 2022-07-16: 40 mg via INTRAVENOUS
  Administered 2022-07-16: 20 mg via INTRAVENOUS

## 2022-07-16 MED ORDER — PHENYLEPHRINE 80 MCG/ML (10ML) SYRINGE FOR IV PUSH (FOR BLOOD PRESSURE SUPPORT)
PREFILLED_SYRINGE | INTRAVENOUS | Status: AC
  Filled 2022-07-16: qty 10

## 2022-07-16 MED ORDER — CHLORHEXIDINE GLUCONATE 0.12 % MT SOLN
OROMUCOSAL | Status: AC
  Administered 2022-07-16: 15 mL via OROMUCOSAL
  Filled 2022-07-16: qty 15

## 2022-07-16 MED ORDER — ACETAMINOPHEN 500 MG PO TABS: 1000.0000 mg | ORAL_TABLET | ORAL | Status: AC

## 2022-07-16 MED ORDER — FENTANYL CITRATE (PF) 100 MCG/2ML IJ SOLN
INTRAMUSCULAR | Status: AC
  Administered 2022-07-16: 100 ug via INTRAVENOUS
  Filled 2022-07-16: qty 2

## 2022-07-16 MED ORDER — PROPOFOL 10 MG/ML IV BOLUS
INTRAVENOUS | Status: AC
  Filled 2022-07-16: qty 20

## 2022-07-16 MED ORDER — FENTANYL CITRATE (PF) 100 MCG/2ML IJ SOLN: 25.0000 ug | INTRAMUSCULAR | Status: DC | PRN

## 2022-07-16 MED ORDER — ACETAMINOPHEN 500 MG PO TABS
ORAL_TABLET | ORAL | Status: AC
  Administered 2022-07-16: 1000 mg via ORAL
  Filled 2022-07-16: qty 2

## 2022-07-16 MED ORDER — HEMOSTATIC AGENTS (NO CHARGE) OPTIME
TOPICAL | Status: DC | PRN
  Administered 2022-07-16: 1 via TOPICAL

## 2022-07-16 MED ORDER — MIDAZOLAM HCL 2 MG/2ML IJ SOLN
INTRAMUSCULAR | Status: AC
  Administered 2022-07-16: 2 mg via INTRAVENOUS
  Filled 2022-07-16: qty 2

## 2022-07-16 SURGICAL SUPPLY — 46 items
ADH SKN CLS APL DERMABOND .7 (GAUZE/BANDAGES/DRESSINGS) ×1
APL PRP STRL LF DISP 70% ISPRP (MISCELLANEOUS) ×1
APPLIER CLIP 9.375 MED OPEN (MISCELLANEOUS) ×1
APR CLP MED 9.3 20 MLT OPN (MISCELLANEOUS) ×1
BAG COUNTER SPONGE SURGICOUNT (BAG) IMPLANT
BAG SPNG CNTER NS LX DISP (BAG)
BINDER BREAST LRG (GAUZE/BANDAGES/DRESSINGS) IMPLANT
BINDER BREAST XLRG (GAUZE/BANDAGES/DRESSINGS) IMPLANT
CANISTER SUCT 3000ML PPV (MISCELLANEOUS) ×1 IMPLANT
CHLORAPREP W/TINT 26 (MISCELLANEOUS) ×1 IMPLANT
CLIP APPLIE 9.375 MED OPEN (MISCELLANEOUS) ×1 IMPLANT
CNTNR URN SCR LID CUP LEK RST (MISCELLANEOUS) IMPLANT
CONT SPEC 4OZ STRL OR WHT (MISCELLANEOUS)
COVER PROBE W GEL 5X96 (DRAPES) ×1 IMPLANT
COVER SURGICAL LIGHT HANDLE (MISCELLANEOUS) ×1 IMPLANT
DERMABOND ADVANCED .7 DNX12 (GAUZE/BANDAGES/DRESSINGS) ×1 IMPLANT
DEVICE DUBIN SPECIMEN MAMMOGRA (MISCELLANEOUS) ×1 IMPLANT
DRAPE CHEST BREAST 15X10 FENES (DRAPES) ×1 IMPLANT
ELECT CAUTERY BLADE 6.4 (BLADE) ×1 IMPLANT
ELECT REM PT RETURN 9FT ADLT (ELECTROSURGICAL) ×1
ELECTRODE REM PT RTRN 9FT ADLT (ELECTROSURGICAL) ×1 IMPLANT
GAUZE PAD ABD 8X10 STRL (GAUZE/BANDAGES/DRESSINGS) ×1 IMPLANT
GLOVE BIO SURGEON STRL SZ8 (GLOVE) ×1 IMPLANT
GLOVE BIOGEL PI IND STRL 8 (GLOVE) ×1 IMPLANT
GOWN STRL REUS W/ TWL LRG LVL3 (GOWN DISPOSABLE) ×1 IMPLANT
GOWN STRL REUS W/ TWL XL LVL3 (GOWN DISPOSABLE) ×1 IMPLANT
GOWN STRL REUS W/TWL LRG LVL3 (GOWN DISPOSABLE) ×1
GOWN STRL REUS W/TWL XL LVL3 (GOWN DISPOSABLE) ×1
HEMOSTAT ARISTA ABSORB 3G PWDR (HEMOSTASIS) IMPLANT
KIT BASIN OR (CUSTOM PROCEDURE TRAY) ×1 IMPLANT
KIT MARKER MARGIN INK (KITS) ×1 IMPLANT
LIGHT WAVEGUIDE WIDE FLAT (MISCELLANEOUS) IMPLANT
NDL 18GX1X1/2 (RX/OR ONLY) (NEEDLE) IMPLANT
NDL FILTER BLUNT 18X1 1/2 (NEEDLE) IMPLANT
NDL HYPO 25GX1X1/2 BEV (NEEDLE) ×1 IMPLANT
NEEDLE 18GX1X1/2 (RX/OR ONLY) (NEEDLE) IMPLANT
NEEDLE FILTER BLUNT 18X1 1/2 (NEEDLE) IMPLANT
NEEDLE HYPO 25GX1X1/2 BEV (NEEDLE) ×1 IMPLANT
NS IRRIG 1000ML POUR BTL (IV SOLUTION) ×1 IMPLANT
PACK GENERAL/GYN (CUSTOM PROCEDURE TRAY) ×1 IMPLANT
SUT MNCRL AB 4-0 PS2 18 (SUTURE) ×1 IMPLANT
SUT VIC AB 3-0 SH 18 (SUTURE) ×1 IMPLANT
SYR 3ML LL SCALE MARK (SYRINGE) IMPLANT
SYR CONTROL 10ML LL (SYRINGE) ×1 IMPLANT
TOWEL GREEN STERILE (TOWEL DISPOSABLE) ×1 IMPLANT
TOWEL GREEN STERILE FF (TOWEL DISPOSABLE) ×1 IMPLANT

## 2022-07-16 NOTE — Interval H&P Note (Signed)
History and Physical Interval Note:  07/16/2022 12:23 PM  [name]  has presented today for surgery, with the diagnosis of LEFT BREAST CANCER.  The various methods of treatment have been discussed with the patient and family. After consideration of risks, benefits and other options for treatment, the patient has consented to  Procedure(s): LEFT BREAST BRACKETED LUMPECTOMY WITH RADIOACTIVE SEED AND SENTINEL LYMPH NODE BIOPSY (Left) as a surgical intervention.  The patient's history has been reviewed, patient examined, no change in status, stable for surgery.  I have reviewed the patient's chart and labs.  Questions were answered to the patient's satisfaction.   The procedure has been discussed with the patient. Alternatives to surgery have been discussed with the patient.  Risks of surgery include bleeding,  Infection,  Seroma formation, death,  and the need for further surgery.   The patient understands and wishes to proceed. Sentinel lymph node mapping and dissection has been discussed with the patient.  Risk of bleeding,  Infection,  Seroma formation,  Additional procedures,,  Shoulder weakness ,  Shoulder stiffness,  Nerve and blood vessel injury and reaction to the mapping dyes have been discussed.  Alternatives to surgery have been discussed with the patient.  The patient agrees to proceed.   Joanne Wilson A Jari Dipasquale

## 2022-07-16 NOTE — Transfer of Care (Signed)
Immediate Anesthesia Transfer of Care Note  Patient: Joanne Wilson  Procedure(s) Performed: LEFT BREAST BRACKETED LUMPECTOMY WITH RADIOACTIVE SEED AND SENTINEL LYMPH NODE BIOPSY (Left: Breast)  Patient Location: PACU  Anesthesia Type:General  Level of Consciousness: awake, alert , and oriented  Airway & Oxygen Therapy: Patient connected to face mask oxygen  Post-op Assessment: Report given to RN and Post -op Vital signs reviewed and stable  Post vital signs: Reviewed and stable  Last Vitals:  Vitals Value Taken Time  BP 103/79 07/16/22 1500  Temp 36.4 C 07/16/22 1500  Pulse 95 07/16/22 1503  Resp 21 07/16/22 1503  SpO2 92 % 07/16/22 1503  Vitals shown include unvalidated device data.  Last Pain:  Vitals:   07/16/22 1500  TempSrc:   PainSc: Asleep         Complications: There were no known notable events for this encounter.

## 2022-07-16 NOTE — Discharge Instructions (Signed)
[facility] Office Phone Number [phone]  BREAST BIOPSY/ PARTIAL MASTECTOMY: POST OP INSTRUCTIONS  Always review your discharge instruction sheet given to you by the facility where your surgery was performed.  IF YOU HAVE DISABILITY OR FAMILY LEAVE FORMS, YOU MUST BRING THEM TO THE OFFICE FOR PROCESSING.  DO NOT GIVE THEM TO YOUR DOCTOR.  A prescription for pain medication may be given to you upon discharge.  Take your pain medication as prescribed, if needed.  If narcotic pain medicine is not needed, then you may take acetaminophen (Tylenol) or ibuprofen (Advil) as needed. Take your usually prescribed medications unless otherwise directed If you need a refill on your pain medication, please contact your pharmacy.  They will contact our office to request authorization.  Prescriptions will not be filled after 5pm or on week-ends. You should eat very light the first 24 hours after surgery, such as soup, crackers, pudding, etc.  Resume your normal diet the day after surgery. Most patients will experience some swelling and bruising in the breast.  Ice packs and a good support bra will help.  Swelling and bruising can take several days to resolve.  It is common to experience some constipation if taking pain medication after surgery.  Increasing fluid intake and taking a stool softener will usually help or prevent this problem from occurring.  A mild laxative (Milk of Magnesia or Miralax) should be taken according to package directions if there are no bowel movements after 48 hours. Unless discharge instructions indicate otherwise, you may remove your bandages 24-48 hours after surgery, and you may shower at that time.  You may have steri-strips (small skin tapes) in place directly over the incision.  These strips should be left on the skin for 7-10 days.  If your surgeon used skin glue on the incision, you may shower in 24 hours.  The glue will flake off over the next 2-3 weeks.  Any  sutures or staples will be removed at the office during your follow-up visit. ACTIVITIES:  You may resume regular daily activities (gradually increasing) beginning the next day.  Wearing a good support bra or sports bra minimizes pain and swelling.  You may have sexual intercourse when it is comfortable. You may drive when you no longer are taking prescription pain medication, you can comfortably wear a seatbelt, and you can safely maneuver your car and apply brakes. RETURN TO WORK:  ______________________________________________________________________________________ You should see your doctor in the office for a follow-up appointment approximately two weeks after your surgery.  Your doctor's nurse will typically make your follow-up appointment when she calls you with your pathology report.  Expect your pathology report 2-3 business days after your surgery.  You may call to check if you do not hear from us after three days. OTHER INSTRUCTIONS: _______________________________________________________________________________________________ _____________________________________________________________________________________________________________________________________ _____________________________________________________________________________________________________________________________________ _____________________________________________________________________________________________________________________________________  WHEN TO CALL YOUR DOCTOR: Fever over 101.0 Nausea and/or vomiting. Extreme swelling or bruising. Continued bleeding from incision. Increased pain, redness, or drainage from the incision.  The clinic staff is available to answer your questions during regular business hours.  Please don't hesitate to call and ask to speak to one of the nurses for clinical concerns.  If you have a medical emergency, go to the nearest emergency room or call 911.  A surgeon from [facility] is always on call at the hospital.  For further questions, please visit [email or web address]   

## 2022-07-16 NOTE — Op Note (Signed)
Preoperative diagnosis: Stage I left breast cancer upper outer quadrant ER positive  Postoperative diagnosis: Same  Procedure: Left breast seed localized lumpectomy utilizing a bracketed approach with  left axillary sentinel lymph node mapping using MAC trace  Surgeon: Harriette Bouillon, MD  Anesthesia: LMA with 0.25% Marcaine plain plus pectoral block  Drains: None  Specimen: Left breast tissue with 2 localizing seeds and 2 localizing clips.  There were 4 left  deep axillary sentinel nodes that had taken tracer  Indications for procedure: The patient is a 52 year old female with stage I left breast cancer.  She was seen in the multidisciplinary clinic and opted for breast conserving surgery after reviewing all of her surgical options.  We reviewed long-term expectations, local regional recurrence rates, and complications of breast conserving surgery and mastectomy.  She opted for breast conserving surgery.The procedure has been discussed with the patient. Alternatives to surgery have been discussed with the patient.  Risks of surgery include bleeding,  Infection,  Seroma formation, death,  and the need for further surgery.   The patient understands and wishes to proceed. Sentinel lymph node mapping and dissection has been discussed with the patient.  Risk of bleeding,  Infection,  Seroma formation,  Additional procedures,,  Shoulder weakness ,  Shoulder stiffness,  Nerve and blood vessel injury and reaction to the mapping dyes have been discussed.  Alternatives to surgery have been discussed with the patient.  The patient agrees to proceed.    Description of procedure: The patient was met in the holding area and questions were answered.  She underwent a pectoral block per protocol.  She had 2 seeds placed as an outpatient in the left upper outer quadrant.  Films were available for review.  She was then taken back to the operating room.  She was placed supine upon the operating room table.  After  induction of general anesthesia, 2 cc of MAC tracer injected in the left breast in the upper outer quadrant due to previous breast reduction and augmentation.  This was massaged for 5 minutes.  The left breast was then prepped and draped in sterile fashion and second timeout performed.  Neoprobe used to identify the location of the seeds in the left breast  Quadrant.  A curvilinear incision was made over the signal and dissection was carried down.  All tissue around both seeds and both clips were excised with grossly negative margins.  The imaging revealed both seeds and both clips to be present.  Irrigation was used and the cavities made hemostatic with cautery.  Deep tissue planes were closed with 3-0 Vicryl.  4 Monocryl was used to close the skin in a subcuticular fashion.  The mag trace probe was used to identify uptake in the left axilla.  The incision was made along the inferior border of the axillary hairline and dissection was carried down into the deep left axillary contents.  There were 4 nodes that took up the tracer and these were removed from the level 1 deep axillary lymph node basin.  Hemostasis was achieved with combination of cautery, clips and Arista.  The deep tissue planes were approximated with 3-0 Vicryl.  4 Monocryl was used to close the skin.  Dermabond applied.  All counts found to be correct.  Breast binder placed.  The patient was awoke extubated taken to recovery in satisfactory condition.

## 2022-07-16 NOTE — Anesthesia Procedure Notes (Signed)
Anesthesia Regional Block: Pectoralis block   Pre-Anesthetic Checklist: , timeout performed,  Correct Patient, Correct Site, Correct Laterality,  Correct Procedure, Correct Position, site marked,  Risks and benefits discussed,  Surgical consent,  Pre-op evaluation,  At surgeon's request and post-op pain management  Laterality: Left  Prep: chloraprep       Needles:  Injection technique: Single-shot  Needle Type: Echogenic Stimulator Needle     Needle Length: 9cm  Needle Gauge: 21     Additional Needles:   Procedures:,,,, ultrasound used (permanent image in chart),,    Narrative:  Start time: 07/16/2022 12:41 PM End time: 07/16/2022 12:44 PM Injection made incrementally with aspirations every 5 mL.  Performed by: Personally  Anesthesiologist: Linton Rump, MD  Additional Notes: Discussed risks and benefits of nerve block including, but not limited to, prolonged and/or permanent nerve injury involving sensory and/or motor function. Monitors were applied and a time-out was performed. The nerve and associated structures were visualized under ultrasound guidance. After negative aspiration, local anesthetic was slowly injected around the nerve. There was no evidence of high pressure during the procedure. There were no paresthesias. VSS remained stable and the patient tolerated the procedure well.

## 2022-07-16 NOTE — H&P (Signed)
Joanne Wilson is a 52 y.o. female who is seen today as an office consultation for evaluation of Breast Cancer  Patient presents to the [facility] due to mammographic abnormality noted left breast on diagnostic mammography after initial screening mammogram. Imaging showed a mass at 7:00 and a small adjacent mass to that. The main mass was 1.6 cm the other was approximately 6 mm. Core biopsy of both showed invasive lobular carcinoma ER positive PR positive HER2/neu negative. She has no complaints.  Review of Systems: A complete review of systems was obtained from the patient. I have reviewed this information and discussed as appropriate with the patient. See HPI as well for other ROS.  ROS  Medical History: Past Medical History: Diagnosis Date History of cancer  Patient Active Problem List Diagnosis Breast implant status Family history of breast cancer Family history of breast cancer gene mutation in first degree relative Malignant neoplasm of upper-outer quadrant of left breast in female, estrogen receptor positive S/P laparoscopic assisted vaginal hysterectomy (LAVH)  Past Surgical History: Procedure Laterality Date HYSTERECTOMY N/A 02/25/2018 Laparoscopic Vaginal hysterectomy with salpingectomy, bilateral Left breast biopsy 06/11/2022 x 2   No Known Allergies  No current outpatient medications on file prior to visit.  No current facility-administered medications on file prior to visit.  Family History Problem Relation Age of Onset Breast cancer Mother High blood pressure (Hypertension) Father Hyperlipidemia (Elevated cholesterol) Father Deep vein thrombosis (DVT or abnormal blood clot formation) Father   Social History  Tobacco Use Smoking Status Never Smokeless Tobacco Never   Social History  Socioeconomic History Marital status: Single Tobacco Use Smoking status: Never Smokeless tobacco: Never Vaping Use Vaping Use: Never used Substance and Sexual  Activity Alcohol use: Yes Comment: "wine occasionally" Drug use: Never  Objective: There were no vitals filed for this visit. There is no height or weight on file to calculate BMI.  Physical Exam Cardiovascular: Rate and Rhythm: Normal rate. Pulmonary: Effort: Pulmonary effort is normal. Chest: Breasts: Right: Normal. Left: Normal. Musculoskeletal: General: Normal range of motion. Cervical back: Normal range of motion. Lymphadenopathy: Upper Body: Right upper body: No supraclavicular or axillary adenopathy. Left upper body: No supraclavicular or axillary adenopathy. Skin: General: Skin is warm. Neurological: General: No focal deficit present. Mental Status: She is alert. Psychiatric: Mood and Affect: Mood normal. Behavior: Behavior normal.    Labs, Imaging and Diagnostic Testing: CLINICAL DATA: Patient recalled from screening for left breast mass.  EXAM: DIGITAL DIAGNOSTIC UNILATERAL LEFT MAMMOGRAM WITH IMPLANTS, TOMOSYNTHESIS; ULTRASOUND LEFT BREAST LIMITED  TECHNIQUE: Left digital diagnostic mammography and breast tomosynthesis was performed. Standard and/or implant displaced views were performed.; Targeted ultrasound examination of the left breast was performed.  COMPARISON: Previous exam(s).  ACR Breast Density Category b: There are scattered areas of fibroglandular density.  FINDINGS: There is a persistent irregular mass within the upper-outer left breast. There is a small adjacent irregular mass within the upper-outer left breast. The patient has retropectoral implants.  Targeted ultrasound is performed, showing a 12 x 10 x 16 mm irregular hypoechoic mass left breast 2 o'clock position 7 cm from the nipple. There is an adjacent 3 x 6 x 4 mm irregular hypoechoic mass left breast 2 o'clock position 7 cm from the nipple. No left axillary adenopathy.  IMPRESSION: There are 2 adjacent suspicious masses within the left breast 2 o'clock position 7 cm  from the nipple.  RECOMMENDATION: Ultrasound-guided core needle biopsy of the adjacent suspicious left breast mass 2 o'clock position.  I have discussed the  findings and recommendations with the patient. If applicable, a reminder letter will be sent to the patient regarding the next appointment.  BI-RADS CATEGORY 4: Suspicious.   Electronically Signed By: Annia Belt M.D. On: 06/03/2022 17:48  Diagnosis 1. Breast, left, needle core biopsy, 2 o;clock 7 cmfn (coil) INVASIVE LOBULAR CARCINOMA, GRADE 1 (3+1+1) TUBULE FORMATION: SCORE 3 NUCLEAR PLEOMORPHISM: SCORE 1 MITOTIC COUNT: SCORE 1 TOTAL SCORE: 5 OVERALL GRADE: GRADE 1 (5/9) NEGATIVE FOR LYMPHOVASCULAR INVASION NEGATIVE FOR MICROCALCIFICATIONS TUMOR MEASURES 3.5 MM IN GREATEST LINEAR EXTENT 2. Breast, left, needle core biopsy, 2 o'clock 7 cmfn (heart) INVASIVE LOBULAR CARCINOMA, GRADE 1 (3+1+1) TUBULE FORMATION: SCORE 3 NUCLEAR PLEOMORPHISM: SCORE 1 MITOTIC COUNT: SCORE 1 TOTAL SCORE: 5 OVERALL GRADE: GRADE 1 (5/9) NEGATIVE FOR LYMPHOVASCULAR INVASION NEGATIVE FOR MICROCALCIFICATIONS TUMOR MEASURES 7 MM IN GREATEST LINEAR EXTENT Diagnosis Note 1. -2. An immunohistochemical stain for E-cadherin is performed with adequate control and is negative within the tumor supporting a lobular differentiation. Tumors show identical histomorphology and appear to be in the same location immunohistochemical stains for the breast prognostic markers will be performed on 1 specimen (specimen 2). These results will be issued within a subsequent addendum to this report. Case is reviewed by Dr. Reynolds Bowl who concurs with the diagnosis. Diagnosis called to Darl Pikes at [facility] by Dr. Venetia Night on 06/12/2022 at 9:20 AM. 1 of 3 FINAL for NOLAN, LASSER ([id number]) Jerene Bears MD Pathologist, Electronic Signature (Case signed 06/13/2022) Specimen Gross and Clinical Information Specimen Comment 1. In formalin:  2:45, CIT < 5 min 2. In formalin: 2:50, CIT < 5 min Specimen(s) Obtained: 1. Breast, left, needle core biopsy, 2 o;clock 7 cmfn (coil) 2. Breast, left, needle core biopsy, 2 o'clock 7 cmfn (heart) Specimen Clinical Information 1. C/F IDC 2. C/F IDC Gross 1. The specimen is received in formalin labeled with the patient's name and left breast 2 o'clock 7 cm fn, and consists of three cores and one core fragment of tan yellow fibroadipose tissue, ranging from 0.2 x 0.2 x 0.1 cm to 1.1 x 0.2 x 0.1 cm. The specimen is entirely submitted one cassette. Time in formalin 2:45 p.m. on 06/11/22. CIT less than 5 minutes. 2. The specimen is received in formalin labeled with the patient's name and left breast 2 o'clock 7 cm fn, and consist of three cores of tan yellow, hemorrhagic fibroadipose tissue, ranging from 1.7 x 0.2 x 0.2 cm to 1.8 x 0.2 x 0.2 cm. The specimen is entirely submitted in one cassette. Time in formalin 2:50 p.m. on 06/11/22. CIT less than 5 minutes. (KL:kh 06/12/22) Stain(s) used in Diagnosis: The following stain(s) were used in diagnosing the case: PR-ACIS, Her2 by IHC, KI-67-ACIS, ER-ACIS, E-CAD. The control(s) stained appropriately. ADDITIONAL INFORMATION: 2. Breast, left, needle core biopsy, 2 o'clock, 7 cmfn (heart) PROGNOSTIC INDICATORS Results: IMMUNOHISTOCHEMICAL AND MORPHOMETRIC ANALYSIS PERFORMED MANUALLY The tumor cells are negative for Her2 (1+). Estrogen Receptor: 95%, POSITIVE, STRONG STAINING INTENSITY Progesterone Receptor: 95%, POSITIVE, STRONG STAINING INTENSITY Proliferation Marker Ki67: 10% REFERENCE RANGE ESTROGEN RECEPTOR NEGATIVE 0% 2 of 3 FINAL for MOUNA, YAGER ([id number]) ADDITIONAL INFORMATION:(continued) POSITIVE =>1% REFERENCE RANGE PROGESTERONE RECEPTOR NEGATIVE 0% POSITIVE =>1% All controls stained appropriately Marlena Clipper MD Pathologist, Electronic Signature ( Signed 06/13/2022)  Assessment and Plan:  Diagnoses and all orders for  this visit:  Malignant neoplasm of upper-outer quadrant of left breast in female, estrogen receptor positive  Reviewed her mammogram images. Her breast density is at be and I  do not feel MRI will add much more information to her care at this point in time despite her lobular nature.  Discussed breast conserving surgery versus mastectomy reconstruction with respect to long-term survival, local regional recurrence, quality of life and operative and postoperative care. Discussed reconstruction. She is opted for left breast seed localized lumpectomy using a bracketed approach and left axillary sentinel lymph node mapping. Risks and benefits reviewed including lymphedema, major vascular injury, nerve injury, shoulder stiffness, wound complications, wound drainage, cosmesis, and the need for other treatments and/or procedures. We discussed other potential risk which are quite rare including cardiovascular event DVT and the need for additional surgery.   Hayden Rasmussen, MD  I spent a total of 45 minutes in both face-to-face and non-face-to-face activities, excluding procedures performed, for this visit on the date of this encounter.

## 2022-07-16 NOTE — Anesthesia Preprocedure Evaluation (Signed)
Anesthesia Evaluation  Patient identified by MRN, date of birth, ID band Patient awake    Reviewed: Allergy & Precautions, NPO status , Patient's Chart, lab work & pertinent test results  History of Anesthesia Complications (+) PONV and history of anesthetic complications  Airway Mallampati: III  TM Distance: >3 FB Neck ROM: Full   Comment: Previous grade I view with Miller 2, easy mask Dental  (+) Dental Advisory Given   Pulmonary neg pulmonary ROS   Pulmonary exam normal breath sounds clear to auscultation       Cardiovascular negative cardio ROS  Rhythm:Regular Rate:Normal     Neuro/Psych neg Seizures  Neuromuscular disease (radiculopathy)    GI/Hepatic Neg liver ROS,GERD  ,,  Endo/Other  negative endocrine ROS    Renal/GU negative Renal ROS     Musculoskeletal   Abdominal   Peds  Hematology  (+) Blood dyscrasia (thalassemia), anemia   Anesthesia Other Findings Left breast cancer  Reproductive/Obstetrics                             Anesthesia Physical Anesthesia Plan  ASA: 2  Anesthesia Plan: General and Regional   Post-op Pain Management: Tylenol PO (pre-op)* and Regional block*   Induction: Intravenous  PONV Risk Score and Plan: 4 or greater and Ondansetron, Dexamethasone, TIVA, Propofol infusion and Treatment may vary due to age or medical condition  Airway Management Planned: LMA  Additional Equipment:   Intra-op Plan:   Post-operative Plan: Extubation in OR  Informed Consent: I have reviewed the patients History and Physical, chart, labs and discussed the procedure including the risks, benefits and alternatives for the proposed anesthesia with the patient or authorized representative who has indicated his/her understanding and acceptance.     Dental advisory given  Plan Discussed with: CRNA and Anesthesiologist  Anesthesia Plan Comments: (Risks of general  anesthesia discussed including, but not limited to, sore throat, hoarse voice, chipped/damaged teeth, injury to vocal cords, nausea and vomiting, allergic reactions, lung infection, heart attack, stroke, and death. All questions answered.  Discussed potential risks of nerve blocks including, but not limited to, infection, bleeding, nerve damage, seizures, pneumothorax, respiratory depression, and potential failure of the block. Alternatives to nerve blocks discussed. All questions answered. )        Anesthesia Quick Evaluation

## 2022-07-16 NOTE — Anesthesia Procedure Notes (Addendum)
Procedure Name: LMA Insertion Date/Time: 07/16/2022 1:06 PM  Performed by: Sharyn Dross, CRNAPre-anesthesia Checklist: Patient identified, Emergency Drugs available, Suction available and Patient being monitored Patient Re-evaluated:Patient Re-evaluated prior to induction Oxygen Delivery Method: Circle system utilized Preoxygenation: Pre-oxygenation with 100% oxygen Induction Type: IV induction Ventilation: Mask ventilation without difficulty LMA: LMA inserted LMA Size: 4.0 Tube type: Oral Number of attempts: 1 Airway Equipment and Method: Oral airway Placement Confirmation: positive ETCO2 and breath sounds checked- equal and bilateral Tube secured with: Tape Dental Injury: Teeth and Oropharynx as per pre-operative assessment

## 2022-07-17 ENCOUNTER — Encounter (HOSPITAL_COMMUNITY): Payer: Self-pay | Admitting: Surgery

## 2022-07-17 NOTE — Anesthesia Postprocedure Evaluation (Signed)
Anesthesia Post Note  Patient: Joanne Wilson  Procedure(s) Performed: LEFT BREAST BRACKETED LUMPECTOMY WITH RADIOACTIVE SEED AND SENTINEL LYMPH NODE BIOPSY (Left: Breast)     Patient location during evaluation: PACU Anesthesia Type: Regional and General Level of consciousness: awake and alert Pain management: pain level controlled Vital Signs Assessment: post-procedure vital signs reviewed and stable Respiratory status: spontaneous breathing, nonlabored ventilation, respiratory function stable and patient connected to nasal cannula oxygen Cardiovascular status: blood pressure returned to baseline and stable Postop Assessment: no apparent nausea or vomiting Anesthetic complications: no   There were no known notable events for this encounter.  Last Vitals:  Vitals:   07/16/22 1515 07/16/22 1530  BP: 117/74 (!) 125/92  Pulse: 80 69  Resp: (!) 21 18  Temp:  36.4 C  SpO2: 93% 94%    Last Pain:  Vitals:   07/16/22 1530  TempSrc:   PainSc: 0-No pain                 Abrar Bilton S

## 2022-07-23 LAB — SURGICAL PATHOLOGY

## 2022-07-24 ENCOUNTER — Telehealth: Payer: Self-pay | Admitting: *Deleted

## 2022-07-24 ENCOUNTER — Encounter: Payer: Self-pay | Admitting: *Deleted

## 2022-07-24 ENCOUNTER — Ambulatory Visit: Payer: Self-pay | Admitting: Surgery

## 2022-07-24 ENCOUNTER — Encounter: Payer: Self-pay | Admitting: Surgery

## 2022-07-24 NOTE — Telephone Encounter (Signed)
Spoke with this patient and she states the surgical area in her axilla feels warm to touch and is red with some discomfort. She did check her temperature because she did not feel well last night (99.1) Informed her that it would be a good idea to follow up with the surgeon's office for evaluation. Let her know that I would send a message regarding an appt and have the surgeon's office call her.Told her if symptoms worsen (spike fever, pain) to call and speak with on-call provider.   Patient verbalized understanding.

## 2022-07-24 NOTE — Telephone Encounter (Signed)
Ordered oncotype per Dr. Iruku. Sent requisition to pathology and exact sciences. 

## 2022-07-29 ENCOUNTER — Encounter: Payer: Self-pay | Admitting: *Deleted

## 2022-08-03 DIAGNOSIS — C50412 Malignant neoplasm of upper-outer quadrant of left female breast: Secondary | ICD-10-CM | POA: Diagnosis not present

## 2022-08-03 DIAGNOSIS — Z170 Estrogen receptor positive status [ER+]: Secondary | ICD-10-CM | POA: Diagnosis not present

## 2022-08-07 ENCOUNTER — Other Ambulatory Visit: Payer: Self-pay

## 2022-08-07 ENCOUNTER — Encounter (HOSPITAL_COMMUNITY): Payer: Self-pay | Admitting: Surgery

## 2022-08-07 NOTE — Progress Notes (Signed)
SDW call  Patient was given pre-op instructions over the phone. Patient verbalized understanding of instructions provided.   PCP - Dr. Alysia Penna Oncologist: Dr. Macy Mis   PPM/ICD - Denies   Chest x-ray - n/a EKG -  n/a Stress Test - ECHO -  Cardiac Cath -   Sleep Study/sleep apnea/CPAP: Denies  Non-diabetic  Blood Thinner Instructions: Denies  Aspirin Instructions: Denies   ERAS Protcol - Yes, clear liquids until 0730 PRE-SURGERY Ensure or G2-    COVID TEST- n/a    Anesthesia review: No   Patient denies shortness of breath, fever, cough and chest pain over the phone call  Your procedure is scheduled on Thursday Aug 08, 2022  Report to [facility] Main Entrance "A" at  0800 A.M., then check in with the Admitting office.  Call this number if you have problems the morning of surgery:  [phone]   If you have any questions prior to your surgery date call [phone]: Open Monday-Friday 8am-4pm If you experience any cold or flu symptoms such as cough, fever, chills, shortness of breath, etc. between now and your scheduled surgery, please notify us at the above number     Remember:  Do not eat after midnight the night before your surgery  You may drink clear liquids until 0730  the morning of your surgery.   Clear liquids allowed are: Water, Non-Citrus Juices (without pulp), Carbonated Beverages, Clear Tea, Black Coffee ONLY (NO MILK, CREAM OR POWDERED CREAMER of any kind), and Gatorade   Take these medicines the morning of surgery with A SIP OF WATER: None  As of today, STOP taking any Aspirin (unless otherwise instructed by your surgeon) Aleve, Naproxen, Ibuprofen, Motrin, Advil, Goody's, BC's, all herbal medications, fish oil, and all vitamins.

## 2022-08-08 ENCOUNTER — Other Ambulatory Visit: Payer: Self-pay

## 2022-08-08 ENCOUNTER — Ambulatory Visit (HOSPITAL_BASED_OUTPATIENT_CLINIC_OR_DEPARTMENT_OTHER): Payer: 59 | Admitting: Certified Registered"

## 2022-08-08 ENCOUNTER — Encounter (HOSPITAL_COMMUNITY)
Admission: RE | Disposition: A | Discharge status: Home / Self Care | Payer: Self-pay | Source: Home / Self Care | Attending: Surgery

## 2022-08-08 ENCOUNTER — Ambulatory Visit (HOSPITAL_COMMUNITY)
Admission: RE | Admit: 2022-08-08 | Discharge: 2022-08-08 | Disposition: A | Discharge status: Home / Self Care | Payer: 59 | Attending: Surgery | Admitting: Surgery

## 2022-08-08 ENCOUNTER — Encounter (HOSPITAL_COMMUNITY): Payer: Self-pay | Admitting: Surgery

## 2022-08-08 ENCOUNTER — Ambulatory Visit (HOSPITAL_COMMUNITY): Payer: 59 | Admitting: Certified Registered"

## 2022-08-08 DIAGNOSIS — C50412 Malignant neoplasm of upper-outer quadrant of left female breast: Secondary | ICD-10-CM | POA: Diagnosis not present

## 2022-08-08 DIAGNOSIS — C50912 Malignant neoplasm of unspecified site of left female breast: Secondary | ICD-10-CM | POA: Diagnosis not present

## 2022-08-08 DIAGNOSIS — Z9882 Breast implant status: Secondary | ICD-10-CM | POA: Diagnosis not present

## 2022-08-08 HISTORY — PX: RE-EXCISION OF BREAST LUMPECTOMY: SHX6048

## 2022-08-08 LAB — CBC
HCT: 37.4 % (ref 36.0–46.0)
Hemoglobin: 11.7 g/dL — ABNORMAL LOW (ref 12.0–15.0)
MCH: 25.5 pg — ABNORMAL LOW (ref 26.0–34.0)
MCHC: 31.3 g/dL (ref 30.0–36.0)
MCV: 81.5 fL (ref 80.0–100.0)
Platelets: 280 10*3/uL (ref 150–400)
RBC: 4.59 MIL/uL (ref 3.87–5.11)
RDW: 14.2 % (ref 11.5–15.5)
WBC: 4.7 10*3/uL (ref 4.0–10.5)
nRBC: 0 % (ref 0.0–0.2)

## 2022-08-08 LAB — BASIC METABOLIC PANEL
Anion gap: 6 (ref 5–15)
BUN: 12 mg/dL (ref 6–20)
CO2: 23 mmol/L (ref 22–32)
Calcium: 9 mg/dL (ref 8.9–10.3)
Chloride: 108 mmol/L (ref 98–111)
Creatinine, Ser: 0.97 mg/dL (ref 0.44–1.00)
GFR, Estimated: >60 mL/min (ref >60)
Glucose, Bld: 104 mg/dL — ABNORMAL HIGH (ref 70–99)
Potassium: 4.2 mmol/L (ref 3.5–5.1)
Sodium: 137 mmol/L (ref 135–145)

## 2022-08-08 SURGERY — EXCISION, LESION, BREAST
Anesthesia: General | Site: Breast | Laterality: Left

## 2022-08-08 MED ORDER — OXYCODONE HCL 5 MG PO TABS
5.0000 mg | ORAL_TABLET | Freq: Four times a day (QID) | ORAL | 0 refills | Status: DC | PRN
Start: 2022-08-08 — End: 2022-09-04

## 2022-08-08 MED ORDER — LIDOCAINE 2% (20 MG/ML) 5 ML SYRINGE
INTRAMUSCULAR | Status: AC
  Filled 2022-08-08: qty 5

## 2022-08-08 MED ORDER — FENTANYL CITRATE (PF) 250 MCG/5ML IJ SOLN
INTRAMUSCULAR | Status: AC
  Filled 2022-08-08: qty 5

## 2022-08-08 MED ORDER — SCOPOLAMINE 1 MG/3DAYS TD PT72
1.0000 | MEDICATED_PATCH | TRANSDERMAL | Status: DC
  Administered 2022-08-08: 1 via TRANSDERMAL
  Filled 2022-08-08: qty 1

## 2022-08-08 MED ORDER — OXYCODONE HCL 5 MG PO TABS
ORAL_TABLET | ORAL | Status: AC
  Administered 2022-08-08: 5 mg via ORAL
  Filled 2022-08-08: qty 1

## 2022-08-08 MED ORDER — FENTANYL CITRATE (PF) 100 MCG/2ML IJ SOLN
INTRAMUSCULAR | Status: AC
  Filled 2022-08-08: qty 2

## 2022-08-08 MED ORDER — DEXAMETHASONE SODIUM PHOSPHATE 10 MG/ML IJ SOLN
INTRAMUSCULAR | Status: DC | PRN
  Administered 2022-08-08: 10 mg via INTRAVENOUS

## 2022-08-08 MED ORDER — ONDANSETRON HCL 4 MG/2ML IJ SOLN
INTRAMUSCULAR | Status: DC | PRN
  Administered 2022-08-08: 4 mg via INTRAVENOUS

## 2022-08-08 MED ORDER — ACETAMINOPHEN 325 MG PO TABS: 325.0000 mg | ORAL_TABLET | ORAL | Status: DC | PRN

## 2022-08-08 MED ORDER — PROPOFOL 10 MG/ML IV BOLUS
INTRAVENOUS | Status: AC
  Filled 2022-08-08: qty 20

## 2022-08-08 MED ORDER — CEFAZOLIN IN SODIUM CHLORIDE 3-0.9 GM/100ML-% IV SOLN
3.0000 g | INTRAVENOUS | Status: AC
  Administered 2022-08-08: 2 g via INTRAVENOUS
  Filled 2022-08-08: qty 100

## 2022-08-08 MED ORDER — OXYCODONE HCL 5 MG/5ML PO SOLN: 5.0000 mg | Freq: Once | ORAL | Status: AC | PRN

## 2022-08-08 MED ORDER — LIDOCAINE 2% (20 MG/ML) 5 ML SYRINGE
INTRAMUSCULAR | Status: DC | PRN
  Administered 2022-08-08: 40 mg via INTRAVENOUS

## 2022-08-08 MED ORDER — PROPOFOL 500 MG/50ML IV EMUL
INTRAVENOUS | Status: DC | PRN
  Administered 2022-08-08: 150 ug/kg/min via INTRAVENOUS

## 2022-08-08 MED ORDER — ORAL CARE MOUTH RINSE: 15.0000 mL | Freq: Once | OROMUCOSAL | Status: AC

## 2022-08-08 MED ORDER — ACETAMINOPHEN 10 MG/ML IV SOLN: 1000.0000 mg | Freq: Once | INTRAVENOUS | Status: DC | PRN

## 2022-08-08 MED ORDER — MIDAZOLAM HCL 2 MG/2ML IJ SOLN
INTRAMUSCULAR | Status: AC
  Filled 2022-08-08: qty 2

## 2022-08-08 MED ORDER — PROMETHAZINE HCL 25 MG/ML IJ SOLN: 6.2500 mg | INTRAMUSCULAR | Status: DC | PRN

## 2022-08-08 MED ORDER — BUPIVACAINE HCL 0.25 % IJ SOLN
INTRAMUSCULAR | Status: DC | PRN
  Administered 2022-08-08: 20 mL

## 2022-08-08 MED ORDER — CHLORHEXIDINE GLUCONATE CLOTH 2 % EX PADS: 6.0000 | MEDICATED_PAD | Freq: Once | CUTANEOUS | Status: DC

## 2022-08-08 MED ORDER — ACETAMINOPHEN 500 MG PO TABS: 1000.0000 mg | ORAL_TABLET | Freq: Once | ORAL | Status: DC

## 2022-08-08 MED ORDER — FENTANYL CITRATE (PF) 250 MCG/5ML IJ SOLN
INTRAMUSCULAR | Status: DC | PRN
  Administered 2022-08-08: 50 ug via INTRAVENOUS
  Administered 2022-08-08: 25 ug via INTRAVENOUS

## 2022-08-08 MED ORDER — ACETAMINOPHEN 160 MG/5ML PO SOLN: 325.0000 mg | ORAL | Status: DC | PRN

## 2022-08-08 MED ORDER — BUPIVACAINE HCL (PF) 0.25 % IJ SOLN
INTRAMUSCULAR | Status: AC
  Filled 2022-08-08: qty 30

## 2022-08-08 MED ORDER — MIDAZOLAM HCL 2 MG/2ML IJ SOLN
INTRAMUSCULAR | Status: DC | PRN
  Administered 2022-08-08: 2 mg via INTRAVENOUS

## 2022-08-08 MED ORDER — CHLORHEXIDINE GLUCONATE 0.12 % MT SOLN
15.0000 mL | Freq: Once | OROMUCOSAL | Status: AC
  Administered 2022-08-08: 15 mL via OROMUCOSAL
  Filled 2022-08-08: qty 15

## 2022-08-08 MED ORDER — AMISULPRIDE (ANTIEMETIC) 5 MG/2ML IV SOLN: 10.0000 mg | Freq: Once | INTRAVENOUS | Status: DC | PRN

## 2022-08-08 MED ORDER — LACTATED RINGERS IV SOLN: INTRAVENOUS | Status: DC

## 2022-08-08 MED ORDER — 0.9 % SODIUM CHLORIDE (POUR BTL) OPTIME
TOPICAL | Status: DC | PRN
  Administered 2022-08-08: 1000 mL

## 2022-08-08 MED ORDER — PROPOFOL 10 MG/ML IV BOLUS
INTRAVENOUS | Status: DC | PRN
  Administered 2022-08-08: 200 mg via INTRAVENOUS
  Administered 2022-08-08: 150 ug/kg/min via INTRAVENOUS
  Administered 2022-08-08: 50 mg via INTRAVENOUS

## 2022-08-08 MED ORDER — ACETAMINOPHEN 10 MG/ML IV SOLN
INTRAVENOUS | Status: DC | PRN
  Administered 2022-08-08: 1000 mg via INTRAVENOUS

## 2022-08-08 MED ORDER — FENTANYL CITRATE (PF) 100 MCG/2ML IJ SOLN: 25.0000 ug | INTRAMUSCULAR | Status: DC | PRN

## 2022-08-08 MED ORDER — ACETAMINOPHEN 10 MG/ML IV SOLN
INTRAVENOUS | Status: AC
  Filled 2022-08-08: qty 100

## 2022-08-08 MED ORDER — OXYCODONE HCL 5 MG PO TABS: 5.0000 mg | ORAL_TABLET | Freq: Once | ORAL | Status: AC | PRN

## 2022-08-08 SURGICAL SUPPLY — 40 items
ADH SKN CLS APL DERMABOND .7 (GAUZE/BANDAGES/DRESSINGS) ×1
APL PRP STRL LF DISP 70% ISPRP (MISCELLANEOUS) ×1
APPLIER CLIP 9.375 MED OPEN (MISCELLANEOUS)
APR CLP MED 9.3 20 MLT OPN (MISCELLANEOUS)
BAG COUNTER SPONGE SURGICOUNT (BAG) ×1 IMPLANT
BAG SPNG CNTER NS LX DISP (BAG) ×1
BINDER BREAST LRG (GAUZE/BANDAGES/DRESSINGS) IMPLANT
BINDER BREAST XLRG (GAUZE/BANDAGES/DRESSINGS) IMPLANT
CANISTER SUCT 3000ML PPV (MISCELLANEOUS) IMPLANT
CHLORAPREP W/TINT 26 (MISCELLANEOUS) ×1 IMPLANT
CLIP APPLIE 9.375 MED OPEN (MISCELLANEOUS) IMPLANT
COVER PROBE W GEL 5X96 (DRAPES) ×1 IMPLANT
COVER SURGICAL LIGHT HANDLE (MISCELLANEOUS) ×1 IMPLANT
DERMABOND ADVANCED .7 DNX12 (GAUZE/BANDAGES/DRESSINGS) ×1 IMPLANT
DEVICE DUBIN SPECIMEN MAMMOGRA (MISCELLANEOUS) ×1 IMPLANT
DRAPE CHEST BREAST 15X10 FENES (DRAPES) ×1 IMPLANT
ELECT CAUTERY BLADE 6.4 (BLADE) ×1 IMPLANT
ELECT REM PT RETURN 9FT ADLT (ELECTROSURGICAL) ×1
ELECTRODE REM PT RTRN 9FT ADLT (ELECTROSURGICAL) ×1 IMPLANT
GAUZE PAD ABD 8X10 STRL (GAUZE/BANDAGES/DRESSINGS) ×1 IMPLANT
GLOVE BIO SURGEON STRL SZ8 (GLOVE) ×1 IMPLANT
GLOVE BIOGEL PI IND STRL 8 (GLOVE) ×1 IMPLANT
GOWN STRL REUS W/ TWL LRG LVL3 (GOWN DISPOSABLE) ×1 IMPLANT
GOWN STRL REUS W/ TWL XL LVL3 (GOWN DISPOSABLE) ×1 IMPLANT
GOWN STRL REUS W/TWL LRG LVL3 (GOWN DISPOSABLE) ×1
GOWN STRL REUS W/TWL XL LVL3 (GOWN DISPOSABLE) ×1
KIT BASIN OR (CUSTOM PROCEDURE TRAY) ×1 IMPLANT
KIT MARKER MARGIN INK (KITS) ×1 IMPLANT
LIGHT WAVEGUIDE WIDE FLAT (MISCELLANEOUS) IMPLANT
NDL HYPO 25GX1X1/2 BEV (NEEDLE) ×1 IMPLANT
NEEDLE HYPO 25GX1X1/2 BEV (NEEDLE) ×1 IMPLANT
NS IRRIG 1000ML POUR BTL (IV SOLUTION) IMPLANT
PACK GENERAL/GYN (CUSTOM PROCEDURE TRAY) ×1 IMPLANT
SUT MNCRL AB 4-0 PS2 18 (SUTURE) ×1 IMPLANT
SUT SILK 2 0 SH (SUTURE) IMPLANT
SUT VIC AB 2-0 SH 27 (SUTURE)
SUT VIC AB 2-0 SH 27XBRD (SUTURE) IMPLANT
SUT VIC AB 3-0 SH 8-18 (SUTURE) ×1 IMPLANT
SYR CONTROL 10ML LL (SYRINGE) ×1 IMPLANT
TOWEL GREEN STERILE FF (TOWEL DISPOSABLE) IMPLANT

## 2022-08-08 NOTE — Discharge Instructions (Signed)
[facility] Office Phone Number [phone]  BREAST BIOPSY/ PARTIAL MASTECTOMY: POST OP INSTRUCTIONS  Always review your discharge instruction sheet given to you by the facility where your surgery was performed.  IF YOU HAVE DISABILITY OR FAMILY LEAVE FORMS, YOU MUST BRING THEM TO THE OFFICE FOR PROCESSING.  DO NOT GIVE THEM TO YOUR DOCTOR.  A prescription for pain medication may be given to you upon discharge.  Take your pain medication as prescribed, if needed.  If narcotic pain medicine is not needed, then you may take acetaminophen (Tylenol) or ibuprofen (Advil) as needed. Take your usually prescribed medications unless otherwise directed If you need a refill on your pain medication, please contact your pharmacy.  They will contact our office to request authorization.  Prescriptions will not be filled after 5pm or on week-ends. You should eat very light the first 24 hours after surgery, such as soup, crackers, pudding, etc.  Resume your normal diet the day after surgery. Most patients will experience some swelling and bruising in the breast.  Ice packs and a good support bra will help.  Swelling and bruising can take several days to resolve.  It is common to experience some constipation if taking pain medication after surgery.  Increasing fluid intake and taking a stool softener will usually help or prevent this problem from occurring.  A mild laxative (Milk of Magnesia or Miralax) should be taken according to package directions if there are no bowel movements after 48 hours. Unless discharge instructions indicate otherwise, you may remove your bandages 24-48 hours after surgery, and you may shower at that time.  You may have steri-strips (small skin tapes) in place directly over the incision.  These strips should be left on the skin for 7-10 days.  If your surgeon used skin glue on the incision, you may shower in 24 hours.  The glue will flake off over the next 2-3 weeks.  Any  sutures or staples will be removed at the office during your follow-up visit. ACTIVITIES:  You may resume regular daily activities (gradually increasing) beginning the next day.  Wearing a good support bra or sports bra minimizes pain and swelling.  You may have sexual intercourse when it is comfortable. You may drive when you no longer are taking prescription pain medication, you can comfortably wear a seatbelt, and you can safely maneuver your car and apply brakes. RETURN TO WORK:  ______________________________________________________________________________________ You should see your doctor in the office for a follow-up appointment approximately two weeks after your surgery.  Your doctor's nurse will typically make your follow-up appointment when she calls you with your pathology report.  Expect your pathology report 2-3 business days after your surgery.  You may call to check if you do not hear from us after three days. OTHER INSTRUCTIONS: _______________________________________________________________________________________________ _____________________________________________________________________________________________________________________________________ _____________________________________________________________________________________________________________________________________ _____________________________________________________________________________________________________________________________________  WHEN TO CALL YOUR DOCTOR: Fever over 101.0 Nausea and/or vomiting. Extreme swelling or bruising. Continued bleeding from incision. Increased pain, redness, or drainage from the incision.  The clinic staff is available to answer your questions during regular business hours.  Please don't hesitate to call and ask to speak to one of the nurses for clinical concerns.  If you have a medical emergency, go to the nearest emergency room or call 911.  A surgeon from [facility] is always on call at the hospital.  For further questions, please visit [email or web address]   

## 2022-08-08 NOTE — Interval H&P Note (Signed)
History and Physical Interval Note:  08/08/2022 8:19 AM  Joanne Wilson  has presented today for surgery, with the diagnosis of BREAST CANCER.  The various methods of treatment have been discussed with the patient and family. After consideration of risks, benefits and other options for treatment, the patient has consented to  Procedure(s): RE-EXCISION OF LEFT BREAST LUMPECTOMY (Left) as a surgical intervention.  The patient's history has been reviewed, patient examined, no change in status, stable for surgery.  I have reviewed the patient's chart and labs.  Questions were answered to the patient's satisfaction.     Ninfa Giannelli A Chrishon Martino

## 2022-08-08 NOTE — Anesthesia Postprocedure Evaluation (Signed)
Anesthesia Post Note  Patient: Joanne Wilson  Procedure(s) Performed: RE-EXCISION OF LEFT BREAST LUMPECTOMY (Left: Breast)     Patient location during evaluation: PACU Anesthesia Type: General Level of consciousness: awake and alert Pain management: pain level controlled Vital Signs Assessment: post-procedure vital signs reviewed and stable Respiratory status: spontaneous breathing, nonlabored ventilation, respiratory function stable and patient connected to nasal cannula oxygen Cardiovascular status: blood pressure returned to baseline and stable Postop Assessment: no apparent nausea or vomiting Anesthetic complications: no  No notable events documented.  Last Vitals:  Vitals:   08/08/22 1215 08/08/22 1230  BP: 119/85 132/82  Pulse: 81 73  Resp: 18 20  Temp:  36.6 C  SpO2: 96% 96%    Last Pain:  Vitals:   08/08/22 1230  PainSc: 1                  Shelton Silvas

## 2022-08-08 NOTE — Anesthesia Preprocedure Evaluation (Addendum)
Anesthesia Evaluation  Patient identified by MRN, date of birth, ID band Patient awake    Reviewed: Allergy & Precautions, NPO status , Patient's Chart, lab work & pertinent test results  History of Anesthesia Complications (+) PONV and history of anesthetic complications  Airway Mallampati: I  TM Distance: >3 FB Neck ROM: Full    Dental  (+) Teeth Intact, Dental Advisory Given   Pulmonary neg pulmonary ROS   breath sounds clear to auscultation       Cardiovascular negative cardio ROS  Rhythm:Regular Rate:Normal     Neuro/Psych negative neurological ROS  negative psych ROS   GI/Hepatic Neg liver ROS,GERD  ,,  Endo/Other  negative endocrine ROS    Renal/GU negative Renal ROS     Musculoskeletal negative musculoskeletal ROS (+)    Abdominal   Peds  Hematology negative hematology ROS (+)   Anesthesia Other Findings   Reproductive/Obstetrics                             Anesthesia Physical Anesthesia Plan  ASA: 2  Anesthesia Plan: General   Post-op Pain Management: Tylenol PO (pre-op)*   Induction: Intravenous  PONV Risk Score and Plan: 4 or greater and Ondansetron, Dexamethasone, TIVA and Midazolam  Airway Management Planned: LMA  Additional Equipment: None  Intra-op Plan:   Post-operative Plan: Extubation in OR  Informed Consent: I have reviewed the patients History and Physical, chart, labs and discussed the procedure including the risks, benefits and alternatives for the proposed anesthesia with the patient or authorized representative who has indicated his/her understanding and acceptance.       Plan Discussed with: CRNA  Anesthesia Plan Comments:        Anesthesia Quick Evaluation

## 2022-08-08 NOTE — Anesthesia Procedure Notes (Signed)
Procedure Name: LMA Insertion Date/Time: 08/08/2022 10:45 AM  Performed by: Nadara Mustard, CRNAPre-anesthesia Checklist: Patient identified, Emergency Drugs available, Suction available, Patient being monitored and Timeout performed Patient Re-evaluated:Patient Re-evaluated prior to induction Oxygen Delivery Method: Circle system utilized Preoxygenation: Pre-oxygenation with 100% oxygen Induction Type: IV induction Ventilation: Mask ventilation without difficulty LMA: LMA inserted LMA Size: 4.0 Placement Confirmation: breath sounds checked- equal and bilateral Tube secured with: Tape

## 2022-08-08 NOTE — H&P (Signed)
History of Present Illness: Joanne Wilson is a 52 y.o. female who is seen today for follow-up after left breast lumpectomy with left axillary sentinel lymph node mapping. Final pathology showed a 5 cm tumor with 2 positive margins. The nodes were clear. She is here today to discuss next steps. She does report some serosanguineous discharge from the left axilla. This been going on for couple days and she is treated this by applying a pad and the seems to be getting less..    Review of Systems: A complete review of systems was obtained from the patient. I have reviewed this information and discussed as appropriate with the patient. See HPI as well for other ROS.    Medical History: Past Medical History: Diagnosis Date History of cancer  Patient Active Problem List Diagnosis Breast implant status Family history of breast cancer Family history of breast cancer gene mutation in first degree relative Malignant neoplasm of upper-outer quadrant of left breast in female, estrogen receptor positive (CMS/HHS-HCC) S/P laparoscopic assisted vaginal hysterectomy (LAVH)  Past Surgical History: Procedure Laterality Date HYSTERECTOMY N/A 02/25/2018 Laparoscopic Vaginal hysterectomy with salpingectomy, bilateral Left breast biopsy 06/11/2022 x 2 MASTECTOMY PARTIAL / LUMPECTOMY   No Known Allergies  No current outpatient medications on file prior to visit.  No current facility-administered medications on file prior to visit.  Family History Problem Relation Age of Onset Breast cancer Mother High blood pressure (Hypertension) Father Hyperlipidemia (Elevated cholesterol) Father Deep vein thrombosis (DVT or abnormal blood clot formation) Father   Social History  Tobacco Use Smoking Status Never Smokeless Tobacco Never   Social History  Socioeconomic History Marital status: Single Tobacco Use Smoking status: Never Smokeless tobacco: Never Vaping Use Vaping status: Never  Used Substance and Sexual Activity Alcohol use: Yes Comment: "wine occasionally" Drug use: Never  Social Determinants of Health  Financial Resource Strain: Low Risk (06/19/2022) Received from [facility] Overall Financial Resource Strain (CARDIA) Difficulty of Paying Living Expenses: Not very hard Food Insecurity: No Food Insecurity (06/19/2022) Received from [facility] Hunger Vital Sign Worried About Running Out of Food in the Last Year: Never true Ran Out of Food in the Last Year: Never true Transportation Needs: No Transportation Needs (06/19/2022) Received from [facility] - Transportation Lack of Transportation (Medical): No Lack of Transportation (Non-Medical): No  Objective:  Vitals: 07/29/22 1038 PainSc: 0-No pain  There is no height or weight on file to calculate BMI.  Left breast incision clean dry intact. Left ax incision shows some serosanguineous discharge. There is no cellulitis. This appears to be a small seroma that is decompressing. Pad noted.  Labs, Imaging and Diagnostic Testing:  Pathology showed 5 cm lobular carcinoma left breast with positive margins. 5 nodes negative.  Assessment and Plan:  Diagnoses and all orders for this visit:  Post-operative state    Reviewed the path findings and recommended reexcision lumpectomy to her. Also reviewed mastectomy. She has implants therefore might need further reconstruction options as well. After reviewing the pros and cons of breast conserving surgery mastectomy with revisional reconstruction, she is decided to proceed with left breast reexcision lumpectomy.The procedure has been discussed with the patient. Alternatives to surgery have been discussed with the patient. Risks of surgery include bleeding, Infection, Seroma formation, death, and the need for further surgery. The patient understands and wishes to proceed.

## 2022-08-08 NOTE — Transfer of Care (Signed)
Immediate Anesthesia Transfer of Care Note  Patient: Joanne Wilson  Procedure(s) Performed: RE-EXCISION OF LEFT BREAST LUMPECTOMY (Left: Breast)  Patient Location: PACU  Anesthesia Type:General  Level of Consciousness: awake  Airway & Oxygen Therapy: Patient Spontanous Breathing and Patient connected to nasal cannula oxygen  Post-op Assessment: Report given to RN and Post -op Vital signs reviewed and stable  Post vital signs: Reviewed and stable  Last Vitals:  Vitals Value Taken Time  BP 101/76 08/08/22 1140  Temp 36.6 C 08/08/22 1140  Pulse 96 08/08/22 1142  Resp 15 08/08/22 1142  SpO2 99 % 08/08/22 1142  Vitals shown include unvalidated device data.  Last Pain:  Vitals:   08/08/22 1140  PainSc: Asleep      Patients Stated Pain Goal: 0 (08/08/22 0829)  Complications: No notable events documented.

## 2022-08-08 NOTE — Op Note (Signed)
Nurses: Left breast cancer stage I upper outer quadrant  Postoperative diagnosis: Same  Procedure: Reexcision left breast lumpectomy  Surgeon: Harriette Bouillon, MD  Anesthesia: LMA with 0.25% Marcaine plain  EBL: Normal  Specimen: All margins of lumpectomy cavity excised and oriented with ink to pathology.  Drains: None  Indications for procedure: The patient is a 52 year old female who had a previous lumpectomy for left breast cancer.  She has a posterior medial margins that were positive and presents today for excision.  I discussed excision for margins given the lobular subtype.  Risk benefits reviewed and she agreed to proceed.The procedure has been discussed with the patient. Alternatives to surgery have been discussed with the patient.  Risks of surgery include bleeding,  Infection, injury to implant or implant removal, seroma formation, death,  and the need for further surgery.   The patient understands and wishes to proceed.    Description of procedure: The patient was met in the holding area and questions were answered.  Left breast was marked as correct site.  She was taken back to the operating room.  She is placed supine upon the operating table.  After induction of general anesthesia, left breast was prepped and draped in sterile fashion and timeout performed.  Previous incision was opened and the seroma evacuated.  I infiltrated the skin and subcu tissues with local anesthetic.  Of note she had an implant in place.  The posterior margin was actually what was left to the pectoralis muscle as well as capsule.  All margins were excised.  All margins were oriented with ink.  The cavity was irrigated.  Hemostasis achieved.  There appeared to be no gross tumor involving the implant or capsule.  I saw no gross tumor involving any of the margins as well.  After assuring hemostasis with cautery, deep tissue planes approximated 3-0 Vicryl and 4-0 Monocryl was used to close the skin in a  subcuticular fashion.  Dermabond applied.  All counts found to be correct.  Breast binder placed.  The patient was awoke extubated taken to recovery in satisfactory condition.

## 2022-08-09 ENCOUNTER — Encounter (HOSPITAL_COMMUNITY): Payer: Self-pay | Admitting: Surgery

## 2022-08-09 LAB — SURGICAL PATHOLOGY

## 2022-08-12 ENCOUNTER — Encounter: Payer: Self-pay | Admitting: Surgery

## 2022-08-12 ENCOUNTER — Encounter: Payer: Self-pay | Admitting: *Deleted

## 2022-08-12 ENCOUNTER — Telehealth: Payer: Self-pay | Admitting: *Deleted

## 2022-08-12 DIAGNOSIS — Z170 Estrogen receptor positive status [ER+]: Secondary | ICD-10-CM

## 2022-08-12 NOTE — Telephone Encounter (Signed)
Received oncotype results of 6/3%.  Referral placed for Dr. Roselind Messier.

## 2022-08-21 ENCOUNTER — Inpatient Hospital Stay: Payer: 59 | Attending: Genetic Counselor | Admitting: Hematology and Oncology

## 2022-08-21 ENCOUNTER — Other Ambulatory Visit: Payer: Self-pay

## 2022-08-21 VITALS — BP 146/77 | HR 86 | Temp 97.9°F | Resp 17 | Ht 67.0 in | Wt 212.3 lb

## 2022-08-21 DIAGNOSIS — Z170 Estrogen receptor positive status [ER+]: Secondary | ICD-10-CM | POA: Diagnosis not present

## 2022-08-21 DIAGNOSIS — Z800 Family history of malignant neoplasm of digestive organs: Secondary | ICD-10-CM | POA: Insufficient documentation

## 2022-08-21 DIAGNOSIS — C50412 Malignant neoplasm of upper-outer quadrant of left female breast: Secondary | ICD-10-CM | POA: Insufficient documentation

## 2022-08-21 DIAGNOSIS — Z803 Family history of malignant neoplasm of breast: Secondary | ICD-10-CM | POA: Insufficient documentation

## 2022-08-21 NOTE — Assessment & Plan Note (Signed)
This is a very pleasant 52 year old female patient with newly diagnosed left breast invasive lobular carcinoma, grade 1, ER/PR positive HER2 negative Ki-67 of 20% referred to breast [facility] for additional recommendations.  Given low-grade tumor, lobular histology and a strong ER/PR positivity she will proceed with upfront surgery first followed by Oncotype testing.  Since her last visit here she had left breast lumpectomy which showed 5 cm invasive lobular carcinoma, low-grade, positive margins reexcised with negative margin, strong ER/PR positivity 95%, Ki-67 of 10% and no evidence of lymph node involvement.  Oncotype DX was low at 6, hence no benefit from adjuvant chemotherapy.  She will proceed with adjuvant radiation.  After radiation she will consider antiestrogen therapy for 5 to 10 years.  At this time she does not qualify for adjuvant abemaciclib.  I have discussed about tamoxifen as a choice of antiestrogen therapy given her perimenopausal status.  We have once again reviewed mechanism of action, adverse effects including but not limited to postmenopausal changes such as hot flashes, vaginal discharge, increased risk of DVT/PE, endometrial hyperplasia and endometrial carcinoma and benefit on bone density.  She had hysterectomy hence endometrial side effects are relevant for her.  She will return to clinic after radiation to initiate antiestrogen therapy.

## 2022-08-21 NOTE — Progress Notes (Signed)
[facility] CONSULT NOTE  Patient Care Team: Nche, Bonna Gains, NP as PCP - General (Internal Medicine) Patient, No Pcp Per (General Practice) Harriette Bouillon, MD as Consulting Physician (General Surgery) Rachel Moulds, MD as Consulting Physician (Hematology and Oncology) Antony Blackbird, MD as Consulting Physician (Radiation Oncology) Pershing Proud, RN as Oncology Nurse Navigator Donnelly Angelica, RN as Oncology Nurse Navigator  CHIEF COMPLAINTS/PURPOSE OF CONSULTATION:  Newly diagnosed breast cancer  HISTORY OF PRESENTING ILLNESS:  Joanne Wilson 52 y.o. female is here because of recent diagnosis of left breast cancer  I reviewed her records extensively and collaborated the history with the patient.  SUMMARY OF ONCOLOGIC HISTORY: Oncology History   No history exists.   Patient had bilateral screening mammogram which showed a possible asymmetry in the left breast warranting further investigation.  On diagnostic mammogram, there were 2 adjacent suspicious masses in the left breast 2 o'clock position 7 cm from the nipple. Pathology from the left breast needle core biopsy showed grade 1 invasive lobular carcinoma, prognostic showed ER +95% strong staining PR 95% positive strong staining Ki-67 of 10%.  According to the pathologist, both's masses appear identical under the microscope.  Since her last visit here she had left breast lumpectomy, final pathology showed 5 cm, invasive globular carcinoma, grade 1, margins positive hence reexcised negative margin.  ER 95% positive strong staining PR 95% positive strong staining, KI of 10% HER2 1+, no evidence of lymph node involvement.  Oncotype DX of 6, distant risk recurrence at 9 years of 3%, no benefit from adjuvant chemotherapy.  She is now getting ready to see radiation oncology to initiate adjuvant radiation and is here to review pathology results.  She had a few questions about her pathology report.  She otherwise appears  to be healing well from her surgery.  No major concerns expressed.  Rest of the pertinent 10 point ROS reviewed and negative  MEDICAL HISTORY:  Past Medical History:  Diagnosis Date   Anemia    Thalasemia   Cancer (HCC)    Left Breast   Carpal tunnel syndrome    COVID    2 times -  2020 and 2021   Family history of breast cancer    Finger fracture, left    5th digit (small finger)   GERD (gastroesophageal reflux disease)    PONV (postoperative nausea and vomiting)    PONV (postoperative nausea and vomiting) 08/10/2019   Varicose vein of leg     SURGICAL HISTORY: Past Surgical History:  Procedure Laterality Date   ABDOMINAL HYSTERECTOMY     AUGMENTATION MAMMAPLASTY Bilateral 2014   BREAST BIOPSY Left 06/11/2022   [place] LT BREAST BX W LOC DEV 1ST LESION IMG BX SPEC US GUIDE 06/11/2022 [facility]   BREAST BIOPSY Left 06/11/2022   [place] LT BREAST BX W LOC DEV EA ADD LESION IMG BX SPEC US GUIDE 06/11/2022 [facility]   BREAST BIOPSY  07/15/2022   MM LT RADIOACTIVE SEED LOC MAMMO GUIDE 07/15/2022 [facility]   BREAST BIOPSY  07/15/2022   MM LT RADIOACTIVE SEED EA ADD LESION LOC MAMMO GUIDE 07/15/2022 [facility]   BREAST LUMPECTOMY WITH RADIOACTIVE SEED AND SENTINEL LYMPH NODE BIOPSY Left 07/16/2022   Procedure: LEFT BREAST BRACKETED LUMPECTOMY WITH RADIOACTIVE SEED AND SENTINEL LYMPH NODE BIOPSY;  Surgeon: Harriette Bouillon, MD;  Location: [facility] OR;  Service: General;  Laterality: Left;   LAPAROSCOPIC VAGINAL HYSTERECTOMY WITH SALPINGECTOMY Bilateral 02/25/2018   Procedure: LAPAROSCOPIC ASSISTED VAGINAL HYSTERECTOMY  WITH SALPINGECTOMY;  Surgeon: Geryl Rankins, MD;  Location: [facility];  Service: Gynecology;  Laterality: Bilateral;   OPEN REDUCTION INTERNAL FIXATION (ORIF) METACARPAL Left 07/10/2017   Procedure: OPEN REDUCTION INTERNAL FIXATION (ORIF) METACARPAL;  Surgeon: Bradly Bienenstock, MD;  Location: [facility] [place];  Service: Orthopedics;  Laterality: Left;    RE-EXCISION OF BREAST LUMPECTOMY Left 08/08/2022   Procedure: RE-EXCISION OF LEFT BREAST LUMPECTOMY;  Surgeon: Harriette Bouillon, MD;  Location: [facility] OR;  Service: General;  Laterality: Left;   TUBAL LIGATION Bilateral 2005   PPTL   VARICOSE VEIN SURGERY Bilateral    surgery years ago and sclerothraphy ~ 2015    SOCIAL HISTORY: Social History   Socioeconomic History   Marital status: Divorced    Spouse name: Not on file   Number of children: 3   Years of education: Not on file   Highest education level: Not on file  Occupational History   Occupation: CMA, [employer]   Tobacco Use   Smoking status: Never   Smokeless tobacco: Never  Vaping Use   Vaping Use: Never used  Substance and Sexual Activity   Alcohol use: No   Drug use: No   Sexual activity: Not on file  Other Topics Concern   Not on file  Social History Narrative   Household:pt and one of her daughters    Social Determinants of Health   Financial Resource Strain: Low Risk  (06/19/2022)   Overall Financial Resource Strain (CARDIA)    Difficulty of Paying Living Expenses: Not very hard  Food Insecurity: No Food Insecurity (06/19/2022)   Hunger Vital Sign    Worried About Running Out of Food in the Last Year: Never true    Ran Out of Food in the Last Year: Never true  Transportation Needs: No Transportation Needs (06/19/2022)   PRAPARE - Administrator, Civil Service (Medical): No    Lack of Transportation (Non-Medical): No  Physical Activity: Not on file  Stress: Not on file  Social Connections: Not on file  Intimate Partner Violence: Not on file    FAMILY HISTORY: Family History  Problem Relation Age of Onset   Breast cancer Mother 6       second cancer at 47   Hyperlipidemia Father    Kidney disease Father    Hypertension Father    Evelene Croon Parkinson White syndrome Brother    Breast cancer Maternal Grandmother 67       second cancer at 61   Breast cancer Cousin        mother's  maternal first cousin dx 2x under 50   Breast cancer Other    Colon cancer Neg Hx     ALLERGIES:  has No Known Allergies.  MEDICATIONS:  Current Outpatient Medications  Medication Sig Dispense Refill   ASHWAGANDHA PO Take 2 capsules by mouth 3 (three) times a week.     MAGNESIUM PO Take 1 spray by mouth at bedtime.     Omega-3 Fatty Acids (FISH OIL OMEGA-3 PO) Take 1 capsule by mouth once a week.     OVER THE COUNTER MEDICATION Take 5 mLs by mouth 3 (three) times a week. Black seed oil     oxyCODONE (OXY IR/ROXICODONE) 5 MG immediate release tablet Take 1 tablet (5 mg total) by mouth every 6 (six) hours as needed for severe pain. (Patient not taking: Reported on 08/06/2022) 15 tablet 0   oxyCODONE (OXY IR/ROXICODONE) 5 MG immediate release tablet Take 1  tablet (5 mg total) by mouth every 6 (six) hours as needed for severe pain. 15 tablet 0   UNABLE TO FIND Take 1 capsule by mouth 3 (three) times a week. Sea Moss     No current facility-administered medications for this visit.    REVIEW OF SYSTEMS:   Constitutional: Denies fevers, chills or abnormal night sweats Eyes: Denies blurriness of vision, double vision or watery eyes Ears, nose, mouth, throat, and face: Denies mucositis or sore throat Respiratory: Denies cough, dyspnea or wheezes Cardiovascular: Denies palpitation, chest discomfort or lower extremity swelling Gastrointestinal:  Denies nausea, heartburn or change in bowel habits Skin: Denies abnormal skin rashes Lymphatics: Denies new lymphadenopathy or easy bruising Neurological:Denies numbness, tingling or new weaknesses Behavioral/Psych: Mood is stable, no new changes  Breast: Denies any palpable lumps or discharge All other systems were reviewed with the patient and are negative.  PHYSICAL EXAMINATION: ECOG PERFORMANCE STATUS: 0 - Asymptomatic  Vitals:   08/21/22 1520  BP: (!) 146/77  Pulse: 86  Resp: 17  Temp: 97.9 F (36.6 C)  SpO2: 98%   Filed Weights    08/21/22 1520  Weight: 212 lb 5 oz (96.3 kg)    GENERAL:alert, no distress and comfortable Rest of the physical exam deferred in lieu of counseling LABORATORY DATA:  I have reviewed the data as listed Lab Results  Component Value Date   WBC 4.7 08/08/2022   HGB 11.7 (L) 08/08/2022   HCT 37.4 08/08/2022   MCV 81.5 08/08/2022   PLT 280 08/08/2022   Lab Results  Component Value Date   NA 137 08/08/2022   K 4.2 08/08/2022   CL 108 08/08/2022   CO2 23 08/08/2022    RADIOGRAPHIC STUDIES: I have personally reviewed the radiological reports and agreed with the findings in the report.  ASSESSMENT AND PLAN:  Malignant neoplasm of upper-outer quadrant of left breast in female, estrogen receptor positive (HCC) This is a very pleasant 52 year old female patient with newly diagnosed left breast invasive lobular carcinoma, grade 1, ER/PR positive HER2 negative Ki-67 of 20% referred to breast [facility] for additional recommendations.  Given low-grade tumor, lobular histology and a strong ER/PR positivity she will proceed with upfront surgery first followed by Oncotype testing.  Since her last visit here she had left breast lumpectomy which showed 5 cm invasive lobular carcinoma, low-grade, positive margins reexcised with negative margin, strong ER/PR positivity 95%, Ki-67 of 10% and no evidence of lymph node involvement.  Oncotype DX was low at 6, hence no benefit from adjuvant chemotherapy.  She will proceed with adjuvant radiation.  After radiation she will consider antiestrogen therapy for 5 to 10 years.  At this time she does not qualify for adjuvant abemaciclib.  I have discussed about tamoxifen as a choice of antiestrogen therapy given her perimenopausal status.  We have once again reviewed mechanism of action, adverse effects including but not limited to postmenopausal changes such as hot flashes, vaginal discharge, increased risk of DVT/PE, endometrial hyperplasia and endometrial carcinoma and  benefit on bone density.  She had hysterectomy hence endometrial side effects are relevant for her.  She will return to clinic after radiation to initiate antiestrogen therapy.    Total time spent: 30 minutes including history, physical exam, review of records, counseling and coordination of care All questions were answered. The patient knows to call the clinic with any problems, questions or concerns.    Rachel Moulds, MD 08/21/22

## 2022-08-21 NOTE — Progress Notes (Signed)
Location of Breast Cancer: Malignant neoplasm of upper-outer quadrant of left breast in female, estrogen receptor positive   Histology per Pathology Report:  07-16-22 A. BREAST, LEFT, LUMPECTOMY:      Invasive lobular carcinoma, 5.0 cm, grade 1 Lobular carcinoma in situ:  Present Margins, invasive:  Positive           Closest, invasive:  Posterior and medial margins Margins, LCIS:  Negative           Closest, LCIS:  1 mm to medial margin Lymphovascular invasion:  Not identified Prognostic markers: ER: 95%, Positive, strong staining intensity PR: 95%, Positive, strong staining intensity Her2: Negative, 1+ Ki-67:10% Other: atypical ductal hyperplasia (ADH), adenosis See oncology table and comment  B. LYMPH NODE, LEFT AXILLARY, SENTINEL, EXCISION:      One lymph node, negative for metastatic carcinoma (0/1).  C. LYMPH NODE, LEFT AXILLARY, SENTINEL, EXCISION:      One lymph node, negative for metastatic carcinoma (0/1).  D. LYMPH NODE, LEFT AXILLARY, SENTINEL, EXCISION:      One lymph node, negative for metastatic carcinoma (0/1).  E. LYMPH NODE, LEFT AXILLARY, SENTINEL, EXCISION:      One lymph node, negative for metastatic carcinoma (0/1).  F. LYMPH NODE, LEFT AXILLARY, SENTINEL, EXCISION:      One lymph node, negative for metastatic carcinoma (0/1).  ONCOLOGY TABLE:  INVASIVE CARCINOMA OF THE BREAST:  Resection  Procedure: Lumpectomy Specimen Laterality: Left Histologic Type: Invasive lobular carcinoma Histologic Grade:      Glandular (Acinar)/Tubular Differentiation: 3      Nuclear Pleomorphism: 1      Mitotic Rate: 1      Overall Grade: 1 Tumor Size: 5.0 cm in maximal dimension Lobular Carcinoma In Situ: Present Lymphatic and/or Vascular Invasion:  Not identified Treatment Effect in the Breast: No known presurgical therapy Margins: Posterior and medial margins are positive invasive carcinoma LCIS Margins: Uninvolved by LCIS      Distance from Closest Margin (mm):  1 mm      Specify Closest Margin (required only if <51mm): Medial margin Regional Lymph Nodes:      Number of Lymph Nodes Examined: 5      Number of Sentinel Nodes Examined: 5      Number of Lymph Nodes with Macrometastases (>2 mm): 0      Number of Lymph Nodes with Micrometastases: 0      Number of Lymph Nodes with Isolated Tumor Cells (=0.2 mm or =200 cells): 0      Size of Largest Metastatic Deposit (mm): NA      Extranodal Extension: Not identified Distant Metastasis:      Distant Site(s) Involved: Not applicable Breast Biomarker Testing Performed on Previous Biopsy:      Testing Performed on Case Number: [id number]            Estrogen Receptor: 95%, Positive, strong staining pattern            Progesterone Receptor: 95%, Positive, strong staining pattern            HER2: Negative, 1+            Ki-67: 10% Pathologic Stage Classification (pTNM, AJCC 8th Edition): pT2, pN0 Representative Tumor Block: A3 Comment(s): See comment (v4.5.0.0)   08-08-22 FINAL MICROSCOPIC DIAGNOSIS:   A. BREAST, LEFT SUPERIOR MARGIN, RE-EXCISION:  - Fat necrosis, negative for carcinoma   B. BREAST, LEFT ANTERIOR MARGIN, RE-EXCISION:  - Benign breast parenchyma with fat necrosis, negative for carcinoma   C. BREAST,  LEFT MEDIAL MARGIN, RE-EXCISION:  - Benign breast parenchyma with previous procedure-related changes  - Negative for carcinoma   D. BREAST, LEFT LATERAL MARGIN, RE-EXCISION:  - Benign breast parenchyma with previous procedure-related changes  - Negative for carcinoma   E. BREAST, LEFT POSTERIOR MARGIN, RE-EXCISION:  - Benign breast parenchyma with previous procedure-related changes  - Negative for carcinoma   F. BREAST, LEFT INFERIOR MARGIN, RE-EXCISION:  - Benign breast parenchyma with previous procedure-related changes  - Negative for carcinoma   Receptor Status: ER(95%), PR (95%), Her2-neu (neg), Ki-67(10%)  Did patient present with symptoms (if so, please note symptoms)  or was this found on screening mammography?: screening mammogram  Past/Anticipated interventions by surgeon, if any: 07-16-22 Procedure: Left breast seed localized lumpectomy utilizing a bracketed approach with  left axillary sentinel lymph node mapping using MAC trace   Surgeon: Harriette Bouillon, MD  08-08-22 Procedure: Reexcision left breast lumpectomy   Surgeon: Harriette Bouillon, MD  Past/Anticipated interventions by medical oncology, if any:  Dr. Al Pimple on 06-19-22 ASSESSMENT AND PLAN:  Malignant neoplasm of upper-outer quadrant of left breast in female, estrogen receptor positive (HCC) This is a very pleasant 52 year old female patient with newly diagnosed left breast invasive lobular carcinoma, grade 1, ER/PR positive HER2 negative Ki-67 of 20% referred to breast [facility] for additional recommendations.  Given low-grade tumor, lobular histology and a strong ER/PR positivity she will proceed with upfront surgery first followed by Oncotype testing.  We have discussed the following details about Oncotype.   We have discussed about Oncotype Dx score which is a well validated prognostic scoring system which can predict outcome with endocrine therapy alone and whether chemotherapy reduces recurrence.  Typically in patients with ER positive cancers that are node negative if the RS score is high typically greater than or equal to 26, chemotherapy is recommended.  In women with intermediate recurrence score younger than 50, there can still be some role for chemotherapy in addition to endocrine therapy especially if the recurrence score is between 21-25. If chemotherapy is needed, this will precede radiation and then after radiation she will continue on antiestrogen therapy.   If she does not need adjuvant chemotherapy, she will proceed with adjuvant radiation followed by antiestrogen therapy.  Given her premenopausal status we have discussed about tamoxifen today as her choice of antiestrogen therapy.   We  have discussed options for antiestrogen therapy today  With regards to Tamoxifen, we discussed that this is a SERM, selective estrogen receptor modulator. We discussed mechanism of action of Tamoxifen, adverse effects on Tamoxifen including but not limited to post menopausal symptoms, increased risk of DVT/PE, increased risk of endometrial cancer, questionable cataracts with long term use and increased risk of cardiovascular events in the study which was not statistically significant. A benefit from Tamoxifen would be improvement in bone density. With regards to aromatase inhibitors, we discussed mechanism of action, adverse effects including but not limited to post menopausal symptoms, arthralgias, myalgias, increased risk of cardiovascular events and bone loss.    She understands importance of antiestrogen therapy.  She will return to clinic after surgery.  All her questions were answered to the best of my knowledge.  Thank you for consulting [place] in the care of this patient.  Please not hesitate to contact us with any additional questions or concerns.  Lymphedema issues, if any:  no   Pain issues, if any:  no     Skin:  Healing well.   SAFETY ISSUES: Prior radiation? no Pacemaker/ICD?  no Possible current pregnancy?no Is the patient on methotrexate? no  Current Complaints / other details:     BP 119/76 (BP Location: Right Arm, Patient Position: Sitting, Cuff Size: Large)   Pulse 85   Temp 97.9 F (36.6 C)   Resp 20   Ht 5\' 7"  (1.702 m)   Wt 212 lb 6.4 oz (96.3 kg)   LMP 02/13/2018   SpO2 100%   BMI 33.27 kg/m

## 2022-08-23 ENCOUNTER — Telehealth: Payer: Self-pay | Admitting: Hematology and Oncology

## 2022-08-23 NOTE — Telephone Encounter (Signed)
Spoke with patient confirming upcoming appointment  

## 2022-09-03 NOTE — Progress Notes (Signed)
Radiation Oncology         [phone] ________________________________  Name: Joanne Wilson MRN: [id number]  Date: 09/04/2022  DOB: 02/12/1971  Re-Evaluation Note  CC: Nche, Bonna Gains, NP  Rachel Moulds, MD  No diagnosis found.  Diagnosis: Stage IA (cT1c, cN0, cM0) Left Breast UOQ, Invasive and in situ lobular carcinoma, ER+ / PR+ / Her2-, Grade 1: s/p lumpectomy and SLN excisions   Narrative:  The patient returns today to discuss radiation treatment options. She was seen in the multidisciplinary breast clinic on 06/19/22.   She opted to proceed with left breast lumpectomy and left axillary SLN evaluation on 07/16/22 under the care of Dr. Luisa Hart. Pathology from the procedure revealed: tumor the size of 5.0 cm; histology of grade 1 invasive lobular carcinoma with LCIS; posterior and medial margin positive for invasive carcinoma; all margins negative for in situ carcinoma (close at 1 mm from the medial margin); nodal statu status of 5/5 left axillary SLN excisions negative for carcinoma. Prognostic indicators significant for: estrogen receptor 95% positive and progesterone receptor 95% positive, both with strong staining intensity; Proliferation marker Ki67 at 10%; Her2 status negative; Grade 1.   She accordingly underwent re-excision of the positive margins on 08/08/22. Pathology showed no evidence carcinoma in all final margins.   Oncotype DX was obtained on the final surgical sample and the recurrence score of 6 predicts a risk of recurrence outside the breast over the next 9 years of 3%, if the patient's only systemic therapy is an antiestrogen for 5 years.  It also predicts no significant benefit from chemotherapy.  Per her most recent follow-up visit with Dr. Al Pimple on 08/21/22, the patient has opted to proceed with antiestrogen therapy consisting of tamoxifen in the setting of her perimenopausal status. She will return to Dr. Al Pimple following XRT to initiate antiestrogen therapy.    On review of systems, the patient reports ***. She denies *** and any other symptoms.    Allergies:  has No Known Allergies.  Meds: Current Outpatient Medications  Medication Sig Dispense Refill   ASHWAGANDHA PO Take 2 capsules by mouth 3 (three) times a week.     MAGNESIUM PO Take 1 spray by mouth at bedtime.     Omega-3 Fatty Acids (FISH OIL OMEGA-3 PO) Take 1 capsule by mouth once a week.     OVER THE COUNTER MEDICATION Take 5 mLs by mouth 3 (three) times a week. Black seed oil     oxyCODONE (OXY IR/ROXICODONE) 5 MG immediate release tablet Take 1 tablet (5 mg total) by mouth every 6 (six) hours as needed for severe pain. (Patient not taking: Reported on 08/06/2022) 15 tablet 0   oxyCODONE (OXY IR/ROXICODONE) 5 MG immediate release tablet Take 1 tablet (5 mg total) by mouth every 6 (six) hours as needed for severe pain. 15 tablet 0   UNABLE TO FIND Take 1 capsule by mouth 3 (three) times a week. [place]     No current facility-administered medications for this encounter.    Physical Findings: The patient is in no acute distress. Patient is alert and oriented.  vitals were not taken for this visit.  No significant changes. Lungs are clear to auscultation bilaterally. Heart has regular rate and rhythm. No palpable cervical, supraclavicular, or axillary adenopathy. Abdomen soft, non-tender, normal bowel sounds. Right Breast: no palpable mass, nipple discharge or bleeding. Left Breast: ***  Lab Findings: Lab Results  Component Value Date   WBC 4.7 08/08/2022   HGB  11.7 (L) 08/08/2022   HCT 37.4 08/08/2022   MCV 81.5 08/08/2022   PLT 280 08/08/2022    Radiographic Findings: No results found.  Impression:  Stage IA (cT1c, cN0, cM0) Left Breast UOQ, Invasive and in situ lobular carcinoma, ER+ / PR+ / Her2-, Grade 1: s/p lumpectomy and SLN excisions   ***  Plan:  Patient is scheduled for CT simulation {date/later today}. ***  -----------------------------------  Billie Lade, PhD, MD  This document serves as a record of services personally performed by Antony Blackbird, MD. It was created on his behalf by Neena Rhymes, a trained medical scribe. The creation of this record is based on the scribe's personal observations and the provider's statements to them. This document has been checked and approved by the attending provider.

## 2022-09-04 ENCOUNTER — Ambulatory Visit
Admission: RE | Admit: 2022-09-04 | Discharge: 2022-09-04 | Disposition: A | Discharge status: Home / Self Care | Payer: 59 | Source: Ambulatory Visit | Attending: Radiation Oncology | Admitting: Radiation Oncology

## 2022-09-04 ENCOUNTER — Encounter: Payer: Self-pay | Admitting: Radiation Oncology

## 2022-09-04 VITALS — BP 119/76 | HR 85 | Temp 97.9°F | Resp 20 | Ht 67.0 in | Wt 212.4 lb

## 2022-09-04 DIAGNOSIS — C50412 Malignant neoplasm of upper-outer quadrant of left female breast: Secondary | ICD-10-CM

## 2022-09-04 DIAGNOSIS — Z170 Estrogen receptor positive status [ER+]: Secondary | ICD-10-CM | POA: Insufficient documentation

## 2022-09-04 DIAGNOSIS — Z510 Encounter for antineoplastic radiation therapy: Secondary | ICD-10-CM | POA: Diagnosis not present

## 2022-09-05 DIAGNOSIS — Z510 Encounter for antineoplastic radiation therapy: Secondary | ICD-10-CM | POA: Diagnosis not present

## 2022-09-05 DIAGNOSIS — C50412 Malignant neoplasm of upper-outer quadrant of left female breast: Secondary | ICD-10-CM | POA: Diagnosis not present

## 2022-09-05 DIAGNOSIS — Z170 Estrogen receptor positive status [ER+]: Secondary | ICD-10-CM | POA: Diagnosis not present

## 2022-09-09 ENCOUNTER — Encounter: Payer: Self-pay | Admitting: *Deleted

## 2022-09-09 DIAGNOSIS — C50412 Malignant neoplasm of upper-outer quadrant of left female breast: Secondary | ICD-10-CM

## 2022-09-11 NOTE — Therapy (Signed)
OUTPATIENT PHYSICAL THERAPY BREAST CANCER POST OP FOLLOW UP   Patient Name: Joanne Wilson MRN: [id number] DOB:02/22/1971, 52 y.o., female Today's Date: 09/12/2022  END OF SESSION:   Past Medical History:  Diagnosis Date   Anemia    Thalasemia   Cancer (HCC)    Left Breast   Carpal tunnel syndrome    COVID    2 times -  2020 and 2021   Family history of breast cancer    Finger fracture, left    5th digit (small finger)   GERD (gastroesophageal reflux disease)    PONV (postoperative nausea and vomiting)    PONV (postoperative nausea and vomiting) 08/10/2019   Varicose vein of leg    Past Surgical History:  Procedure Laterality Date   ABDOMINAL HYSTERECTOMY     AUGMENTATION MAMMAPLASTY Bilateral 2014   BREAST BIOPSY Left 06/11/2022   [place] LT BREAST BX W LOC DEV 1ST LESION IMG BX SPEC US GUIDE 06/11/2022 [facility]   BREAST BIOPSY Left 06/11/2022   [place] LT BREAST BX W LOC DEV EA ADD LESION IMG BX SPEC US GUIDE 06/11/2022 [facility]   BREAST BIOPSY  07/15/2022   MM LT RADIOACTIVE SEED LOC MAMMO GUIDE 07/15/2022 [facility]   BREAST BIOPSY  07/15/2022   MM LT RADIOACTIVE SEED EA ADD LESION LOC MAMMO GUIDE 07/15/2022 [facility]   BREAST LUMPECTOMY WITH RADIOACTIVE SEED AND SENTINEL LYMPH NODE BIOPSY Left 07/16/2022   Procedure: LEFT BREAST BRACKETED LUMPECTOMY WITH RADIOACTIVE SEED AND SENTINEL LYMPH NODE BIOPSY;  Surgeon: Harriette Bouillon, MD;  Location: [facility];  Service: General;  Laterality: Left;   LAPAROSCOPIC VAGINAL HYSTERECTOMY WITH SALPINGECTOMY Bilateral 02/25/2018   Procedure: LAPAROSCOPIC ASSISTED VAGINAL HYSTERECTOMY WITH SALPINGECTOMY;  Surgeon: Geryl Rankins, MD;  Location: [facility];  Service: Gynecology;  Laterality: Bilateral;   OPEN REDUCTION INTERNAL FIXATION (ORIF) METACARPAL Left 07/10/2017   Procedure: OPEN REDUCTION INTERNAL FIXATION (ORIF) METACARPAL;  Surgeon: Bradly Bienenstock, MD;  Location: [facility] [place];  Service:  Orthopedics;  Laterality: Left;   RE-EXCISION OF BREAST LUMPECTOMY Left 08/08/2022   Procedure: RE-EXCISION OF LEFT BREAST LUMPECTOMY;  Surgeon: Harriette Bouillon, MD;  Location: [facility];  Service: General;  Laterality: Left;   TUBAL LIGATION Bilateral 2005   PPTL   VARICOSE VEIN SURGERY Bilateral    surgery years ago and sclerothraphy ~ 2015   Patient Active Problem List   Diagnosis Date Noted   Genetic testing 06/24/2022   Malignant neoplasm of upper-outer quadrant of left breast in female, estrogen receptor positive (HCC) 06/18/2022   Breast implant status 02/15/2022   Family history of breast cancer gene mutation in first degree relative 02/15/2022   Radiculopathy 08/10/2019   Carpal tunnel syndrome, left 08/10/2019   GERD (gastroesophageal reflux disease) 08/10/2019   S/P laparoscopic assisted vaginal hysterectomy (LAVH) 02/25/2018   Family history of breast cancer     REFERRING PROVIDER: Dr. Harriette Bouillon  REFERRING DIAG: Left breast cancer  THERAPY DIAG:  Malignant neoplasm of upper-outer quadrant of left breast in female, estrogen receptor positive (HCC)  Abnormal posture  Aftercare following surgery for neoplasm  At risk for lymphedema  Rationale for Evaluation and Treatment: Rehabilitation  ONSET DATE: 05/01/22  SUBJECTIVE:  SUBJECTIVE STATEMENT: I feel back to normal.  I start radiation next Wednesday.    PERTINENT HISTORY: left breast lumpectomy 07/16/22 and re-excision 08/08/22, final pathology showed 5 cm, invasive globular carcinoma, grade 1. ER 95% positive strong staining PR 95% positive strong staining, KI of 10% HER2 1+, no evidence of lymph node involvement 5 negative nodes removed.    PATIENT GOALS:  Reassess how my recovery is going related to arm function, pain, and  swelling.  PAIN:  Are you having pain? No  PRECAUTIONS: Recent Surgery, left UE Lymphedema risk  ACTIVITY LEVEL / LEISURE: back to normal    OBJECTIVE:   PATIENT SURVEYS:  QUICK DASH: 15.9 to 11.3%  OBSERVATIONS: Well healed - no cording or seroma  POSTURE:  WNL  LYMPHEDEMA ASSESSMENT:   UPPER EXTREMITY AROM/PROM:   A/PROM RIGHT   eval    Shoulder extension 53  Shoulder flexion 159  Shoulder abduction 167  Shoulder internal rotation 66  Shoulder external rotation 83                          (Blank rows = not tested)   A/PROM LEFT   eval 09/12/22  Shoulder extension 54 60  Shoulder flexion 148 155  Shoulder abduction 173 170  Shoulder internal rotation 53   Shoulder external rotation 84 95                          (Blank rows = not tested)   CERVICAL AROM: All within normal limits   UPPER EXTREMITY STRENGTH: WFL   LYMPHEDEMA ASSESSMENTS:    LANDMARK RIGHT   eval  10 cm proximal to olecranon process 32.8  Olecranon process 26.7  10 cm proximal to ulnar styloid process 23  Just proximal to ulnar styloid process 16.5  Across hand at thumb web space 18.5  At base of 2nd digit 5.9  (Blank rows = not tested)   LANDMARK LEFT   eval 09/12/22  10 cm proximal to olecranon process 34 36  Olecranon process 28.4 30  10  cm proximal to ulnar styloid process 22.1 23  Just proximal to ulnar styloid process 16.5 18  Across hand at thumb web space 19.7 20  At base of 2nd digit 5.9 6.0  (Blank rows = not tested)  PATIENT EDUCATION:  Education details: post op care Person educated: Patient and Child(ren) Education method: Explanation and Handouts Education comprehension: verbalized understanding  HOME EXERCISE PROGRAM: Reviewed previously given post op HEP.   ASSESSMENT:  CLINICAL IMPRESSION: Pt is doing very well after surgery with return to baseline AROM.  She has returned to work and has no limitations.  She will start radiation next week and will  continue with SOZO screenings. Verbal education on ABC class due to work schedule with handouts given.   Pt will benefit from skilled therapeutic intervention to improve on the following deficits: Decreased knowledge of precautions, impaired UE functional use, pain, decreased ROM, postural dysfunction.   PT treatment/interventions: ADL/Self care home management, Therapeutic exercises, Patient/Family education, Self Care, Manual therapy, and Re-evaluation   GOALS: Goals reviewed with patient? Yes  LONG TERM GOALS:  (STG=LTG)  GOALS Name Target Date  Goal status  1 Pt will demonstrate she has regained full shoulder ROM and function post operatively compared to baselines.  Baseline: 09/12/22 INITIAL  PLAN:  PT FREQUENCY/DURATION: SOZO only  PLAN FOR NEXT SESSION: SOZO only   [facility]  [address], [address]  [place] [address]  [phone]  After Breast Cancer Class It is recommended you attend the ABC class to be educated on lymphedema risk reduction. This class is free of charge and lasts for 1 hour. It is a 1-time class. You will need to download the TEAMS app either on your phone or computer. We will send you a link the night before or the morning of the class. You should be able to click on that link to join the class. This is not a confidential class. You don't have to turn your camera on, but other participants may be able to see your email address.  Scar massage You can begin gentle scar massage to you incision sites. Gently place one hand on the incision and move the skin (without sliding on the skin) in various directions. Do this for a few minutes and then you can gently massage either coconut oil or vitamin E cream into the scars.  Compression garment You should continue wearing your compression bra until you feel like you no longer have swelling.  Home exercise Program Continue doing the exercises you were given until  you feel like you can do them without feeling any tightness at the end.   Walking Program Studies show that 30 minutes of walking per day (fast enough to elevate your heart rate) can significantly reduce the risk of a cancer recurrence. If you can't walk due to other medical reasons, we encourage you to find another activity you could do (like a stationary bike or water exercise).  Posture After breast cancer surgery, people frequently sit with rounded shoulders posture because it puts their incisions on slack and feels better. If you sit like this and scar tissue forms in that position, you can become very tight and have pain sitting or standing with good posture. Try to be aware of your posture and sit and stand up tall to heal properly.  Follow up PT: It is recommended you return every 3 months for the first 3 years following surgery to be assessed on the SOZO machine for an L-Dex score. This helps prevent clinically significant lymphedema in 95% of patients. These follow up screens are 10 minute appointments that you are not billed for.  Idamae Lusher, PT 09/12/2022, 4:11 PM

## 2022-09-12 ENCOUNTER — Ambulatory Visit: Payer: 59 | Attending: Surgery | Admitting: Rehabilitation

## 2022-09-12 ENCOUNTER — Encounter: Payer: Self-pay | Admitting: Rehabilitation

## 2022-09-12 DIAGNOSIS — Z170 Estrogen receptor positive status [ER+]: Secondary | ICD-10-CM

## 2022-09-12 DIAGNOSIS — Z483 Aftercare following surgery for neoplasm: Secondary | ICD-10-CM | POA: Diagnosis not present

## 2022-09-12 DIAGNOSIS — Z9189 Other specified personal risk factors, not elsewhere classified: Secondary | ICD-10-CM | POA: Diagnosis not present

## 2022-09-12 DIAGNOSIS — R293 Abnormal posture: Secondary | ICD-10-CM | POA: Diagnosis not present

## 2022-09-12 DIAGNOSIS — C50412 Malignant neoplasm of upper-outer quadrant of left female breast: Secondary | ICD-10-CM | POA: Diagnosis not present

## 2022-09-18 ENCOUNTER — Other Ambulatory Visit: Payer: Self-pay

## 2022-09-18 ENCOUNTER — Ambulatory Visit
Admission: RE | Admit: 2022-09-18 | Discharge: 2022-09-18 | Disposition: A | Discharge status: Home / Self Care | Payer: 59 | Source: Ambulatory Visit | Attending: Radiation Oncology | Admitting: Radiation Oncology

## 2022-09-18 DIAGNOSIS — C50412 Malignant neoplasm of upper-outer quadrant of left female breast: Secondary | ICD-10-CM | POA: Diagnosis not present

## 2022-09-18 DIAGNOSIS — Z510 Encounter for antineoplastic radiation therapy: Secondary | ICD-10-CM | POA: Diagnosis not present

## 2022-09-18 DIAGNOSIS — Z170 Estrogen receptor positive status [ER+]: Secondary | ICD-10-CM | POA: Diagnosis not present

## 2022-09-18 LAB — RAD ONC ARIA SESSION SUMMARY
Course Elapsed Days: 0
Plan Fractions Treated to Date: 1
Plan Prescribed Dose Per Fraction: 1.8 Gy
Plan Total Fractions Prescribed: 28
Plan Total Prescribed Dose: 50.4 Gy
Reference Point Dosage Given to Date: 1.8 Gy
Reference Point Session Dosage Given: 1.8 Gy
Session Number: 1

## 2022-09-19 ENCOUNTER — Ambulatory Visit
Admission: RE | Admit: 2022-09-19 | Discharge: 2022-09-19 | Disposition: A | Discharge status: Home / Self Care | Payer: 59 | Source: Ambulatory Visit | Attending: Radiation Oncology | Admitting: Radiation Oncology

## 2022-09-19 ENCOUNTER — Other Ambulatory Visit: Payer: Self-pay

## 2022-09-19 DIAGNOSIS — Z170 Estrogen receptor positive status [ER+]: Secondary | ICD-10-CM | POA: Diagnosis not present

## 2022-09-19 DIAGNOSIS — C50412 Malignant neoplasm of upper-outer quadrant of left female breast: Secondary | ICD-10-CM | POA: Diagnosis not present

## 2022-09-19 DIAGNOSIS — Z510 Encounter for antineoplastic radiation therapy: Secondary | ICD-10-CM | POA: Diagnosis not present

## 2022-09-19 LAB — RAD ONC ARIA SESSION SUMMARY
Course Elapsed Days: 1
Plan Fractions Treated to Date: 2
Plan Prescribed Dose Per Fraction: 1.8 Gy
Plan Total Fractions Prescribed: 28
Plan Total Prescribed Dose: 50.4 Gy
Reference Point Dosage Given to Date: 3.6 Gy
Reference Point Session Dosage Given: 1.8 Gy
Session Number: 2

## 2022-09-20 ENCOUNTER — Ambulatory Visit
Admission: RE | Admit: 2022-09-20 | Discharge: 2022-09-20 | Disposition: A | Discharge status: Home / Self Care | Payer: 59 | Source: Ambulatory Visit | Attending: Radiation Oncology | Admitting: Radiation Oncology

## 2022-09-20 ENCOUNTER — Other Ambulatory Visit: Payer: Self-pay

## 2022-09-20 ENCOUNTER — Telehealth: Payer: Self-pay | Admitting: *Deleted

## 2022-09-20 DIAGNOSIS — Z510 Encounter for antineoplastic radiation therapy: Secondary | ICD-10-CM | POA: Diagnosis not present

## 2022-09-20 DIAGNOSIS — C50412 Malignant neoplasm of upper-outer quadrant of left female breast: Secondary | ICD-10-CM | POA: Diagnosis not present

## 2022-09-20 DIAGNOSIS — Z170 Estrogen receptor positive status [ER+]: Secondary | ICD-10-CM | POA: Diagnosis not present

## 2022-09-20 LAB — RAD ONC ARIA SESSION SUMMARY
Course Elapsed Days: 2
Plan Fractions Treated to Date: 3
Plan Prescribed Dose Per Fraction: 1.8 Gy
Plan Total Fractions Prescribed: 28
Plan Total Prescribed Dose: 50.4 Gy
Reference Point Dosage Given to Date: 5.4 Gy
Reference Point Session Dosage Given: 1.8 Gy
Session Number: 3

## 2022-09-20 NOTE — Telephone Encounter (Signed)
09/18/2022: This nurse completed [employer] FMLA form.  To provider for review.  09/20/2022: Inquired of status.  Form signed by provider successfully returned to employer HR   Copy to H.I.M bin for items to be scanned.  Copy mailed to Jeris Penta address on file. [address] [place] [address] No further actions performed or required by this nurse.Marland Kitchen

## 2022-09-23 ENCOUNTER — Ambulatory Visit
Admission: RE | Admit: 2022-09-23 | Discharge: 2022-09-23 | Disposition: A | Discharge status: Home / Self Care | Payer: 59 | Source: Ambulatory Visit | Attending: Radiation Oncology | Admitting: Radiation Oncology

## 2022-09-23 ENCOUNTER — Other Ambulatory Visit: Payer: Self-pay

## 2022-09-23 DIAGNOSIS — C50412 Malignant neoplasm of upper-outer quadrant of left female breast: Secondary | ICD-10-CM | POA: Diagnosis not present

## 2022-09-23 DIAGNOSIS — Z170 Estrogen receptor positive status [ER+]: Secondary | ICD-10-CM | POA: Diagnosis not present

## 2022-09-23 DIAGNOSIS — Z510 Encounter for antineoplastic radiation therapy: Secondary | ICD-10-CM | POA: Diagnosis not present

## 2022-09-23 LAB — RAD ONC ARIA SESSION SUMMARY
Course Elapsed Days: 5
Plan Fractions Treated to Date: 4
Plan Prescribed Dose Per Fraction: 1.8 Gy
Plan Total Fractions Prescribed: 28
Plan Total Prescribed Dose: 50.4 Gy
Reference Point Dosage Given to Date: 7.2 Gy
Reference Point Session Dosage Given: 1.8 Gy
Session Number: 4

## 2022-09-24 ENCOUNTER — Telehealth: Payer: Self-pay | Admitting: Radiation Oncology

## 2022-09-24 ENCOUNTER — Ambulatory Visit: Payer: 59

## 2022-09-24 NOTE — Telephone Encounter (Signed)
6/25 @ 3:12 pm patient called back from missed call. She is aware that her treatment appt is canceled for today due to L2 machine being down.  Patient will be here for her appt on tomorrow.  Called and spoke to [employer] so they are aware.

## 2022-09-25 ENCOUNTER — Ambulatory Visit
Admission: RE | Admit: 2022-09-25 | Discharge: 2022-09-25 | Disposition: A | Discharge status: Home / Self Care | Payer: 59 | Source: Ambulatory Visit | Attending: Radiation Oncology | Admitting: Radiation Oncology

## 2022-09-25 ENCOUNTER — Other Ambulatory Visit: Payer: Self-pay

## 2022-09-25 DIAGNOSIS — Z170 Estrogen receptor positive status [ER+]: Secondary | ICD-10-CM

## 2022-09-25 DIAGNOSIS — C50412 Malignant neoplasm of upper-outer quadrant of left female breast: Secondary | ICD-10-CM | POA: Diagnosis not present

## 2022-09-25 DIAGNOSIS — Z510 Encounter for antineoplastic radiation therapy: Secondary | ICD-10-CM | POA: Diagnosis not present

## 2022-09-25 LAB — RAD ONC ARIA SESSION SUMMARY
Course Elapsed Days: 7
Plan Fractions Treated to Date: 5
Plan Prescribed Dose Per Fraction: 1.8 Gy
Plan Total Fractions Prescribed: 28
Plan Total Prescribed Dose: 50.4 Gy
Reference Point Dosage Given to Date: 9 Gy
Reference Point Session Dosage Given: 1.8 Gy
Session Number: 5

## 2022-09-25 MED ORDER — RADIAPLEXRX EX GEL: Freq: Once | CUTANEOUS | Status: AC

## 2022-09-26 ENCOUNTER — Other Ambulatory Visit: Payer: Self-pay

## 2022-09-26 ENCOUNTER — Ambulatory Visit
Admission: RE | Admit: 2022-09-26 | Discharge: 2022-09-26 | Disposition: A | Discharge status: Home / Self Care | Payer: 59 | Source: Ambulatory Visit | Attending: Radiation Oncology | Admitting: Radiation Oncology

## 2022-09-26 DIAGNOSIS — C50412 Malignant neoplasm of upper-outer quadrant of left female breast: Secondary | ICD-10-CM | POA: Diagnosis not present

## 2022-09-26 DIAGNOSIS — Z510 Encounter for antineoplastic radiation therapy: Secondary | ICD-10-CM | POA: Diagnosis not present

## 2022-09-26 DIAGNOSIS — Z170 Estrogen receptor positive status [ER+]: Secondary | ICD-10-CM | POA: Diagnosis not present

## 2022-09-26 LAB — RAD ONC ARIA SESSION SUMMARY
Course Elapsed Days: 8
Plan Fractions Treated to Date: 6
Plan Prescribed Dose Per Fraction: 1.8 Gy
Plan Total Fractions Prescribed: 28
Plan Total Prescribed Dose: 50.4 Gy
Reference Point Dosage Given to Date: 10.8 Gy
Reference Point Session Dosage Given: 1.8 Gy
Session Number: 6

## 2022-09-27 ENCOUNTER — Other Ambulatory Visit: Payer: Self-pay

## 2022-09-27 ENCOUNTER — Ambulatory Visit
Admission: RE | Admit: 2022-09-27 | Discharge: 2022-09-27 | Disposition: A | Discharge status: Home / Self Care | Payer: 59 | Source: Ambulatory Visit | Attending: Radiation Oncology | Admitting: Radiation Oncology

## 2022-09-27 DIAGNOSIS — C50412 Malignant neoplasm of upper-outer quadrant of left female breast: Secondary | ICD-10-CM | POA: Diagnosis not present

## 2022-09-27 DIAGNOSIS — Z170 Estrogen receptor positive status [ER+]: Secondary | ICD-10-CM | POA: Diagnosis not present

## 2022-09-27 DIAGNOSIS — Z510 Encounter for antineoplastic radiation therapy: Secondary | ICD-10-CM | POA: Diagnosis not present

## 2022-09-27 LAB — RAD ONC ARIA SESSION SUMMARY
Course Elapsed Days: 9
Plan Fractions Treated to Date: 7
Plan Prescribed Dose Per Fraction: 1.8 Gy
Plan Total Fractions Prescribed: 28
Plan Total Prescribed Dose: 50.4 Gy
Reference Point Dosage Given to Date: 12.6 Gy
Reference Point Session Dosage Given: 1.8 Gy
Session Number: 7

## 2022-09-30 ENCOUNTER — Other Ambulatory Visit: Payer: Self-pay

## 2022-09-30 ENCOUNTER — Ambulatory Visit
Admission: RE | Admit: 2022-09-30 | Discharge: 2022-09-30 | Disposition: A | Discharge status: Home / Self Care | Payer: 59 | Source: Ambulatory Visit | Attending: Radiation Oncology | Admitting: Radiation Oncology

## 2022-09-30 DIAGNOSIS — C50412 Malignant neoplasm of upper-outer quadrant of left female breast: Secondary | ICD-10-CM | POA: Diagnosis not present

## 2022-09-30 DIAGNOSIS — Z170 Estrogen receptor positive status [ER+]: Secondary | ICD-10-CM | POA: Insufficient documentation

## 2022-09-30 DIAGNOSIS — Z510 Encounter for antineoplastic radiation therapy: Secondary | ICD-10-CM | POA: Diagnosis not present

## 2022-09-30 LAB — RAD ONC ARIA SESSION SUMMARY
Course Elapsed Days: 12
Plan Fractions Treated to Date: 8
Plan Prescribed Dose Per Fraction: 1.8 Gy
Plan Total Fractions Prescribed: 28
Plan Total Prescribed Dose: 50.4 Gy
Reference Point Dosage Given to Date: 14.4 Gy
Reference Point Session Dosage Given: 1.8 Gy
Session Number: 8

## 2022-10-01 ENCOUNTER — Ambulatory Visit
Admission: RE | Admit: 2022-10-01 | Discharge: 2022-10-01 | Disposition: A | Discharge status: Home / Self Care | Payer: 59 | Source: Ambulatory Visit | Attending: Radiation Oncology | Admitting: Radiation Oncology

## 2022-10-01 ENCOUNTER — Ambulatory Visit: Payer: 59

## 2022-10-01 ENCOUNTER — Other Ambulatory Visit: Payer: Self-pay

## 2022-10-01 DIAGNOSIS — Z510 Encounter for antineoplastic radiation therapy: Secondary | ICD-10-CM | POA: Diagnosis not present

## 2022-10-01 DIAGNOSIS — Z170 Estrogen receptor positive status [ER+]: Secondary | ICD-10-CM

## 2022-10-01 DIAGNOSIS — C50412 Malignant neoplasm of upper-outer quadrant of left female breast: Secondary | ICD-10-CM | POA: Diagnosis not present

## 2022-10-01 LAB — RAD ONC ARIA SESSION SUMMARY
Course Elapsed Days: 13
Plan Fractions Treated to Date: 9
Plan Prescribed Dose Per Fraction: 1.8 Gy
Plan Total Fractions Prescribed: 28
Plan Total Prescribed Dose: 50.4 Gy
Reference Point Dosage Given to Date: 16.2 Gy
Reference Point Session Dosage Given: 1.8 Gy
Session Number: 9

## 2022-10-01 MED ORDER — RADIAPLEXRX EX GEL: Freq: Once | CUTANEOUS | Status: AC

## 2022-10-01 MED ORDER — ALRA NON-METALLIC DEODORANT (RAD-ONC)
1.0000 | Freq: Once | TOPICAL | Status: AC
  Administered 2022-10-01: 1 via TOPICAL

## 2022-10-02 ENCOUNTER — Ambulatory Visit
Admission: RE | Admit: 2022-10-02 | Discharge: 2022-10-02 | Disposition: A | Discharge status: Home / Self Care | Payer: 59 | Source: Ambulatory Visit | Attending: Radiation Oncology | Admitting: Radiation Oncology

## 2022-10-02 ENCOUNTER — Other Ambulatory Visit: Payer: Self-pay

## 2022-10-02 DIAGNOSIS — Z510 Encounter for antineoplastic radiation therapy: Secondary | ICD-10-CM | POA: Diagnosis not present

## 2022-10-02 DIAGNOSIS — Z170 Estrogen receptor positive status [ER+]: Secondary | ICD-10-CM | POA: Diagnosis not present

## 2022-10-02 DIAGNOSIS — C50412 Malignant neoplasm of upper-outer quadrant of left female breast: Secondary | ICD-10-CM | POA: Diagnosis not present

## 2022-10-02 LAB — RAD ONC ARIA SESSION SUMMARY
Course Elapsed Days: 14
Plan Fractions Treated to Date: 10
Plan Prescribed Dose Per Fraction: 1.8 Gy
Plan Total Fractions Prescribed: 28
Plan Total Prescribed Dose: 50.4 Gy
Reference Point Dosage Given to Date: 18 Gy
Reference Point Session Dosage Given: 1.8 Gy
Session Number: 10

## 2022-10-04 ENCOUNTER — Ambulatory Visit
Admission: RE | Admit: 2022-10-04 | Discharge: 2022-10-04 | Disposition: A | Discharge status: Home / Self Care | Payer: 59 | Source: Ambulatory Visit | Attending: Radiation Oncology | Admitting: Radiation Oncology

## 2022-10-04 ENCOUNTER — Other Ambulatory Visit: Payer: Self-pay

## 2022-10-04 DIAGNOSIS — C50412 Malignant neoplasm of upper-outer quadrant of left female breast: Secondary | ICD-10-CM | POA: Diagnosis not present

## 2022-10-04 DIAGNOSIS — Z170 Estrogen receptor positive status [ER+]: Secondary | ICD-10-CM | POA: Diagnosis not present

## 2022-10-04 DIAGNOSIS — Z510 Encounter for antineoplastic radiation therapy: Secondary | ICD-10-CM | POA: Diagnosis not present

## 2022-10-04 LAB — RAD ONC ARIA SESSION SUMMARY
Course Elapsed Days: 16
Plan Fractions Treated to Date: 11
Plan Prescribed Dose Per Fraction: 1.8 Gy
Plan Total Fractions Prescribed: 28
Plan Total Prescribed Dose: 50.4 Gy
Reference Point Dosage Given to Date: 19.8 Gy
Reference Point Session Dosage Given: 1.8 Gy
Session Number: 11

## 2022-10-07 ENCOUNTER — Ambulatory Visit: Admission: RE | Admit: 2022-10-07 | Payer: 59 | Source: Ambulatory Visit

## 2022-10-07 ENCOUNTER — Other Ambulatory Visit: Payer: Self-pay

## 2022-10-07 ENCOUNTER — Ambulatory Visit: Payer: 59

## 2022-10-08 ENCOUNTER — Other Ambulatory Visit: Payer: Self-pay

## 2022-10-08 ENCOUNTER — Ambulatory Visit
Admission: RE | Admit: 2022-10-08 | Discharge: 2022-10-08 | Disposition: A | Discharge status: Home / Self Care | Payer: 59 | Source: Ambulatory Visit | Attending: Radiation Oncology | Admitting: Radiation Oncology

## 2022-10-08 ENCOUNTER — Ambulatory Visit: Admission: RE | Admit: 2022-10-08 | Payer: 59 | Source: Ambulatory Visit

## 2022-10-08 DIAGNOSIS — Z510 Encounter for antineoplastic radiation therapy: Secondary | ICD-10-CM | POA: Diagnosis not present

## 2022-10-08 DIAGNOSIS — C50412 Malignant neoplasm of upper-outer quadrant of left female breast: Secondary | ICD-10-CM | POA: Diagnosis not present

## 2022-10-08 DIAGNOSIS — Z170 Estrogen receptor positive status [ER+]: Secondary | ICD-10-CM | POA: Diagnosis not present

## 2022-10-08 LAB — RAD ONC ARIA SESSION SUMMARY
Course Elapsed Days: 20
Plan Fractions Treated to Date: 12
Plan Prescribed Dose Per Fraction: 1.8 Gy
Plan Total Fractions Prescribed: 28
Plan Total Prescribed Dose: 50.4 Gy
Reference Point Dosage Given to Date: 21.6 Gy
Reference Point Session Dosage Given: 1.8 Gy
Session Number: 12

## 2022-10-09 ENCOUNTER — Ambulatory Visit
Admission: RE | Admit: 2022-10-09 | Discharge: 2022-10-09 | Disposition: A | Discharge status: Home / Self Care | Payer: 59 | Source: Ambulatory Visit | Attending: Radiation Oncology | Admitting: Radiation Oncology

## 2022-10-09 ENCOUNTER — Other Ambulatory Visit: Payer: Self-pay

## 2022-10-09 DIAGNOSIS — C50412 Malignant neoplasm of upper-outer quadrant of left female breast: Secondary | ICD-10-CM | POA: Diagnosis not present

## 2022-10-09 DIAGNOSIS — Z510 Encounter for antineoplastic radiation therapy: Secondary | ICD-10-CM | POA: Diagnosis not present

## 2022-10-09 DIAGNOSIS — Z170 Estrogen receptor positive status [ER+]: Secondary | ICD-10-CM | POA: Diagnosis not present

## 2022-10-09 LAB — RAD ONC ARIA SESSION SUMMARY
Course Elapsed Days: 21
Plan Fractions Treated to Date: 13
Plan Prescribed Dose Per Fraction: 1.8 Gy
Plan Total Fractions Prescribed: 28
Plan Total Prescribed Dose: 50.4 Gy
Reference Point Dosage Given to Date: 23.4 Gy
Reference Point Session Dosage Given: 1.8 Gy
Session Number: 13

## 2022-10-10 ENCOUNTER — Ambulatory Visit
Admission: RE | Admit: 2022-10-10 | Discharge: 2022-10-10 | Disposition: A | Discharge status: Home / Self Care | Payer: 59 | Source: Ambulatory Visit | Attending: Radiation Oncology | Admitting: Radiation Oncology

## 2022-10-10 ENCOUNTER — Other Ambulatory Visit: Payer: Self-pay

## 2022-10-10 DIAGNOSIS — Z170 Estrogen receptor positive status [ER+]: Secondary | ICD-10-CM | POA: Diagnosis not present

## 2022-10-10 DIAGNOSIS — C50412 Malignant neoplasm of upper-outer quadrant of left female breast: Secondary | ICD-10-CM | POA: Diagnosis not present

## 2022-10-10 DIAGNOSIS — Z510 Encounter for antineoplastic radiation therapy: Secondary | ICD-10-CM | POA: Diagnosis not present

## 2022-10-10 LAB — RAD ONC ARIA SESSION SUMMARY
Course Elapsed Days: 22
Plan Fractions Treated to Date: 14
Plan Prescribed Dose Per Fraction: 1.8 Gy
Plan Total Fractions Prescribed: 28
Plan Total Prescribed Dose: 50.4 Gy
Reference Point Dosage Given to Date: 25.2 Gy
Reference Point Session Dosage Given: 1.8 Gy
Session Number: 14

## 2022-10-11 ENCOUNTER — Other Ambulatory Visit: Payer: Self-pay

## 2022-10-11 ENCOUNTER — Ambulatory Visit
Admission: RE | Admit: 2022-10-11 | Discharge: 2022-10-11 | Disposition: A | Discharge status: Home / Self Care | Payer: 59 | Source: Ambulatory Visit | Attending: Radiation Oncology | Admitting: Radiation Oncology

## 2022-10-11 DIAGNOSIS — Z170 Estrogen receptor positive status [ER+]: Secondary | ICD-10-CM | POA: Diagnosis not present

## 2022-10-11 DIAGNOSIS — Z510 Encounter for antineoplastic radiation therapy: Secondary | ICD-10-CM | POA: Diagnosis not present

## 2022-10-11 DIAGNOSIS — C50412 Malignant neoplasm of upper-outer quadrant of left female breast: Secondary | ICD-10-CM | POA: Diagnosis not present

## 2022-10-11 LAB — RAD ONC ARIA SESSION SUMMARY
Course Elapsed Days: 23
Plan Fractions Treated to Date: 15
Plan Prescribed Dose Per Fraction: 1.8 Gy
Plan Total Fractions Prescribed: 28
Plan Total Prescribed Dose: 50.4 Gy
Reference Point Dosage Given to Date: 27 Gy
Reference Point Session Dosage Given: 1.8 Gy
Session Number: 15

## 2022-10-14 ENCOUNTER — Other Ambulatory Visit: Payer: Self-pay

## 2022-10-14 ENCOUNTER — Ambulatory Visit
Admission: RE | Admit: 2022-10-14 | Discharge: 2022-10-14 | Disposition: A | Discharge status: Home / Self Care | Payer: 59 | Source: Ambulatory Visit | Attending: Radiation Oncology | Admitting: Radiation Oncology

## 2022-10-14 DIAGNOSIS — Z170 Estrogen receptor positive status [ER+]: Secondary | ICD-10-CM | POA: Diagnosis not present

## 2022-10-14 DIAGNOSIS — Z510 Encounter for antineoplastic radiation therapy: Secondary | ICD-10-CM | POA: Diagnosis not present

## 2022-10-14 DIAGNOSIS — C50412 Malignant neoplasm of upper-outer quadrant of left female breast: Secondary | ICD-10-CM | POA: Diagnosis not present

## 2022-10-14 LAB — RAD ONC ARIA SESSION SUMMARY
Course Elapsed Days: 26
Plan Fractions Treated to Date: 16
Plan Prescribed Dose Per Fraction: 1.8 Gy
Plan Total Fractions Prescribed: 28
Plan Total Prescribed Dose: 50.4 Gy
Reference Point Dosage Given to Date: 28.8 Gy
Reference Point Session Dosage Given: 1.8 Gy
Session Number: 16

## 2022-10-15 ENCOUNTER — Other Ambulatory Visit: Payer: Self-pay

## 2022-10-15 ENCOUNTER — Ambulatory Visit
Admission: RE | Admit: 2022-10-15 | Discharge: 2022-10-15 | Disposition: A | Discharge status: Home / Self Care | Payer: 59 | Source: Ambulatory Visit | Attending: Radiation Oncology | Admitting: Radiation Oncology

## 2022-10-15 DIAGNOSIS — Z510 Encounter for antineoplastic radiation therapy: Secondary | ICD-10-CM | POA: Diagnosis not present

## 2022-10-15 DIAGNOSIS — Z170 Estrogen receptor positive status [ER+]: Secondary | ICD-10-CM | POA: Diagnosis not present

## 2022-10-15 DIAGNOSIS — C50412 Malignant neoplasm of upper-outer quadrant of left female breast: Secondary | ICD-10-CM | POA: Diagnosis not present

## 2022-10-15 LAB — RAD ONC ARIA SESSION SUMMARY
Course Elapsed Days: 27
Plan Fractions Treated to Date: 17
Plan Prescribed Dose Per Fraction: 1.8 Gy
Plan Total Fractions Prescribed: 28
Plan Total Prescribed Dose: 50.4 Gy
Reference Point Dosage Given to Date: 30.6 Gy
Reference Point Session Dosage Given: 1.8 Gy
Session Number: 17

## 2022-10-16 ENCOUNTER — Other Ambulatory Visit: Payer: Self-pay

## 2022-10-16 ENCOUNTER — Ambulatory Visit
Admission: RE | Admit: 2022-10-16 | Discharge: 2022-10-16 | Disposition: A | Discharge status: Home / Self Care | Payer: 59 | Source: Ambulatory Visit | Attending: Radiation Oncology | Admitting: Radiation Oncology

## 2022-10-16 DIAGNOSIS — C50412 Malignant neoplasm of upper-outer quadrant of left female breast: Secondary | ICD-10-CM | POA: Diagnosis not present

## 2022-10-16 DIAGNOSIS — Z170 Estrogen receptor positive status [ER+]: Secondary | ICD-10-CM | POA: Diagnosis not present

## 2022-10-16 DIAGNOSIS — Z510 Encounter for antineoplastic radiation therapy: Secondary | ICD-10-CM | POA: Diagnosis not present

## 2022-10-16 LAB — RAD ONC ARIA SESSION SUMMARY
Course Elapsed Days: 28
Plan Fractions Treated to Date: 18
Plan Prescribed Dose Per Fraction: 1.8 Gy
Plan Total Fractions Prescribed: 28
Plan Total Prescribed Dose: 50.4 Gy
Reference Point Dosage Given to Date: 32.4 Gy
Reference Point Session Dosage Given: 1.8 Gy
Session Number: 18

## 2022-10-17 ENCOUNTER — Other Ambulatory Visit: Payer: Self-pay

## 2022-10-17 ENCOUNTER — Ambulatory Visit
Admission: RE | Admit: 2022-10-17 | Discharge: 2022-10-17 | Disposition: A | Discharge status: Home / Self Care | Payer: 59 | Source: Ambulatory Visit | Attending: Radiation Oncology | Admitting: Radiation Oncology

## 2022-10-17 DIAGNOSIS — C50412 Malignant neoplasm of upper-outer quadrant of left female breast: Secondary | ICD-10-CM | POA: Diagnosis not present

## 2022-10-17 DIAGNOSIS — Z510 Encounter for antineoplastic radiation therapy: Secondary | ICD-10-CM | POA: Diagnosis not present

## 2022-10-17 DIAGNOSIS — Z170 Estrogen receptor positive status [ER+]: Secondary | ICD-10-CM | POA: Diagnosis not present

## 2022-10-17 LAB — RAD ONC ARIA SESSION SUMMARY
Course Elapsed Days: 29
Plan Fractions Treated to Date: 19
Plan Prescribed Dose Per Fraction: 1.8 Gy
Plan Total Fractions Prescribed: 28
Plan Total Prescribed Dose: 50.4 Gy
Reference Point Dosage Given to Date: 34.2 Gy
Reference Point Session Dosage Given: 1.8 Gy
Session Number: 19

## 2022-10-18 ENCOUNTER — Other Ambulatory Visit: Payer: Self-pay

## 2022-10-18 ENCOUNTER — Ambulatory Visit
Admission: RE | Admit: 2022-10-18 | Discharge: 2022-10-18 | Disposition: A | Discharge status: Home / Self Care | Payer: 59 | Source: Ambulatory Visit | Attending: Radiation Oncology | Admitting: Radiation Oncology

## 2022-10-18 DIAGNOSIS — C50412 Malignant neoplasm of upper-outer quadrant of left female breast: Secondary | ICD-10-CM | POA: Diagnosis not present

## 2022-10-18 DIAGNOSIS — Z170 Estrogen receptor positive status [ER+]: Secondary | ICD-10-CM | POA: Diagnosis not present

## 2022-10-18 DIAGNOSIS — Z510 Encounter for antineoplastic radiation therapy: Secondary | ICD-10-CM | POA: Diagnosis not present

## 2022-10-18 LAB — RAD ONC ARIA SESSION SUMMARY
Course Elapsed Days: 30
Plan Fractions Treated to Date: 20
Plan Prescribed Dose Per Fraction: 1.8 Gy
Plan Total Fractions Prescribed: 28
Plan Total Prescribed Dose: 50.4 Gy
Reference Point Dosage Given to Date: 36 Gy
Reference Point Session Dosage Given: 1.8 Gy
Session Number: 20

## 2022-10-21 ENCOUNTER — Ambulatory Visit
Admission: RE | Admit: 2022-10-21 | Discharge: 2022-10-21 | Disposition: A | Discharge status: Home / Self Care | Payer: 59 | Source: Ambulatory Visit | Attending: Radiation Oncology | Admitting: Radiation Oncology

## 2022-10-21 ENCOUNTER — Other Ambulatory Visit: Payer: Self-pay

## 2022-10-21 DIAGNOSIS — Z170 Estrogen receptor positive status [ER+]: Secondary | ICD-10-CM | POA: Diagnosis not present

## 2022-10-21 DIAGNOSIS — Z510 Encounter for antineoplastic radiation therapy: Secondary | ICD-10-CM | POA: Diagnosis not present

## 2022-10-21 DIAGNOSIS — C50412 Malignant neoplasm of upper-outer quadrant of left female breast: Secondary | ICD-10-CM | POA: Diagnosis not present

## 2022-10-21 LAB — RAD ONC ARIA SESSION SUMMARY
Course Elapsed Days: 33
Plan Fractions Treated to Date: 21
Plan Prescribed Dose Per Fraction: 1.8 Gy
Plan Total Fractions Prescribed: 28
Plan Total Prescribed Dose: 50.4 Gy
Reference Point Dosage Given to Date: 37.8 Gy
Reference Point Session Dosage Given: 1.8 Gy
Session Number: 21

## 2022-10-22 ENCOUNTER — Other Ambulatory Visit: Payer: Self-pay

## 2022-10-22 ENCOUNTER — Ambulatory Visit
Admission: RE | Admit: 2022-10-22 | Discharge: 2022-10-22 | Disposition: A | Discharge status: Home / Self Care | Payer: 59 | Source: Ambulatory Visit | Attending: Radiation Oncology | Admitting: Radiation Oncology

## 2022-10-22 DIAGNOSIS — C50412 Malignant neoplasm of upper-outer quadrant of left female breast: Secondary | ICD-10-CM | POA: Diagnosis not present

## 2022-10-22 DIAGNOSIS — Z170 Estrogen receptor positive status [ER+]: Secondary | ICD-10-CM | POA: Diagnosis not present

## 2022-10-22 DIAGNOSIS — Z510 Encounter for antineoplastic radiation therapy: Secondary | ICD-10-CM | POA: Diagnosis not present

## 2022-10-22 LAB — RAD ONC ARIA SESSION SUMMARY
Course Elapsed Days: 34
Plan Fractions Treated to Date: 22
Plan Prescribed Dose Per Fraction: 1.8 Gy
Plan Total Fractions Prescribed: 28
Plan Total Prescribed Dose: 50.4 Gy
Reference Point Dosage Given to Date: 39.6 Gy
Reference Point Session Dosage Given: 1.8 Gy
Session Number: 22

## 2022-10-23 ENCOUNTER — Other Ambulatory Visit: Payer: Self-pay

## 2022-10-23 ENCOUNTER — Ambulatory Visit
Admission: RE | Admit: 2022-10-23 | Discharge: 2022-10-23 | Disposition: A | Discharge status: Home / Self Care | Payer: 59 | Source: Ambulatory Visit | Attending: Radiation Oncology | Admitting: Radiation Oncology

## 2022-10-23 DIAGNOSIS — Z510 Encounter for antineoplastic radiation therapy: Secondary | ICD-10-CM | POA: Diagnosis not present

## 2022-10-23 DIAGNOSIS — Z170 Estrogen receptor positive status [ER+]: Secondary | ICD-10-CM | POA: Diagnosis not present

## 2022-10-23 DIAGNOSIS — C50412 Malignant neoplasm of upper-outer quadrant of left female breast: Secondary | ICD-10-CM | POA: Diagnosis not present

## 2022-10-23 LAB — RAD ONC ARIA SESSION SUMMARY
Course Elapsed Days: 35
Plan Fractions Treated to Date: 23
Plan Prescribed Dose Per Fraction: 1.8 Gy
Plan Total Fractions Prescribed: 28
Plan Total Prescribed Dose: 50.4 Gy
Reference Point Dosage Given to Date: 41.4 Gy
Reference Point Session Dosage Given: 1.8 Gy
Session Number: 23

## 2022-10-24 ENCOUNTER — Inpatient Hospital Stay: Payer: 59 | Attending: Genetic Counselor | Admitting: Hematology and Oncology

## 2022-10-24 ENCOUNTER — Ambulatory Visit
Admission: RE | Admit: 2022-10-24 | Discharge: 2022-10-24 | Disposition: A | Discharge status: Home / Self Care | Payer: 59 | Source: Ambulatory Visit | Attending: Radiation Oncology | Admitting: Radiation Oncology

## 2022-10-24 ENCOUNTER — Other Ambulatory Visit: Payer: Self-pay

## 2022-10-24 DIAGNOSIS — Z510 Encounter for antineoplastic radiation therapy: Secondary | ICD-10-CM | POA: Diagnosis not present

## 2022-10-24 DIAGNOSIS — Z170 Estrogen receptor positive status [ER+]: Secondary | ICD-10-CM | POA: Diagnosis not present

## 2022-10-24 DIAGNOSIS — C50412 Malignant neoplasm of upper-outer quadrant of left female breast: Secondary | ICD-10-CM | POA: Diagnosis not present

## 2022-10-24 LAB — RAD ONC ARIA SESSION SUMMARY
Course Elapsed Days: 36
Plan Fractions Treated to Date: 24
Plan Prescribed Dose Per Fraction: 1.8 Gy
Plan Total Fractions Prescribed: 28
Plan Total Prescribed Dose: 50.4 Gy
Reference Point Dosage Given to Date: 43.2 Gy
Reference Point Session Dosage Given: 1.8 Gy
Session Number: 24

## 2022-10-25 ENCOUNTER — Ambulatory Visit
Admission: RE | Admit: 2022-10-25 | Discharge: 2022-10-25 | Disposition: A | Discharge status: Home / Self Care | Payer: 59 | Source: Ambulatory Visit | Attending: Radiation Oncology | Admitting: Radiation Oncology

## 2022-10-25 ENCOUNTER — Other Ambulatory Visit: Payer: Self-pay

## 2022-10-25 DIAGNOSIS — Z510 Encounter for antineoplastic radiation therapy: Secondary | ICD-10-CM | POA: Diagnosis not present

## 2022-10-25 DIAGNOSIS — C50412 Malignant neoplasm of upper-outer quadrant of left female breast: Secondary | ICD-10-CM | POA: Diagnosis not present

## 2022-10-25 DIAGNOSIS — Z170 Estrogen receptor positive status [ER+]: Secondary | ICD-10-CM | POA: Diagnosis not present

## 2022-10-25 LAB — RAD ONC ARIA SESSION SUMMARY
Course Elapsed Days: 37
Plan Fractions Treated to Date: 25
Plan Prescribed Dose Per Fraction: 1.8 Gy
Plan Total Fractions Prescribed: 28
Plan Total Prescribed Dose: 50.4 Gy
Reference Point Dosage Given to Date: 45 Gy
Reference Point Session Dosage Given: 1.8 Gy
Session Number: 25

## 2022-10-28 ENCOUNTER — Ambulatory Visit: Payer: 59

## 2022-10-28 ENCOUNTER — Other Ambulatory Visit: Payer: Self-pay

## 2022-10-28 ENCOUNTER — Ambulatory Visit: Admission: RE | Admit: 2022-10-28 | Payer: 59 | Source: Ambulatory Visit

## 2022-10-28 DIAGNOSIS — Z170 Estrogen receptor positive status [ER+]: Secondary | ICD-10-CM | POA: Diagnosis not present

## 2022-10-28 DIAGNOSIS — Z510 Encounter for antineoplastic radiation therapy: Secondary | ICD-10-CM | POA: Diagnosis not present

## 2022-10-28 DIAGNOSIS — C50412 Malignant neoplasm of upper-outer quadrant of left female breast: Secondary | ICD-10-CM | POA: Diagnosis not present

## 2022-10-28 LAB — RAD ONC ARIA SESSION SUMMARY
Course Elapsed Days: 40
Plan Fractions Treated to Date: 26
Plan Prescribed Dose Per Fraction: 1.8 Gy
Plan Total Fractions Prescribed: 28
Plan Total Prescribed Dose: 50.4 Gy
Reference Point Dosage Given to Date: 46.8 Gy
Reference Point Session Dosage Given: 1.8 Gy
Session Number: 26

## 2022-10-28 MED ORDER — SILVER SULFADIAZINE 1 % EX CREA: TOPICAL_CREAM | Freq: Once | CUTANEOUS | Status: AC

## 2022-10-29 ENCOUNTER — Ambulatory Visit: Payer: 59

## 2022-10-29 ENCOUNTER — Other Ambulatory Visit: Payer: Self-pay

## 2022-10-29 ENCOUNTER — Ambulatory Visit: Admission: RE | Admit: 2022-10-29 | Payer: 59 | Source: Ambulatory Visit

## 2022-10-29 DIAGNOSIS — C50412 Malignant neoplasm of upper-outer quadrant of left female breast: Secondary | ICD-10-CM | POA: Diagnosis not present

## 2022-10-29 DIAGNOSIS — Z510 Encounter for antineoplastic radiation therapy: Secondary | ICD-10-CM | POA: Diagnosis not present

## 2022-10-29 DIAGNOSIS — Z170 Estrogen receptor positive status [ER+]: Secondary | ICD-10-CM | POA: Diagnosis not present

## 2022-10-29 LAB — RAD ONC ARIA SESSION SUMMARY
Course Elapsed Days: 41
Plan Fractions Treated to Date: 27
Plan Prescribed Dose Per Fraction: 1.8 Gy
Plan Total Fractions Prescribed: 28
Plan Total Prescribed Dose: 50.4 Gy
Reference Point Dosage Given to Date: 48.6 Gy
Reference Point Session Dosage Given: 1.8 Gy
Session Number: 27

## 2022-10-30 ENCOUNTER — Ambulatory Visit
Admission: RE | Admit: 2022-10-30 | Discharge: 2022-10-30 | Disposition: A | Discharge status: Home / Self Care | Payer: 59 | Source: Ambulatory Visit | Attending: Radiation Oncology | Admitting: Radiation Oncology

## 2022-10-30 ENCOUNTER — Other Ambulatory Visit: Payer: Self-pay

## 2022-10-30 DIAGNOSIS — C50412 Malignant neoplasm of upper-outer quadrant of left female breast: Secondary | ICD-10-CM | POA: Diagnosis not present

## 2022-10-30 DIAGNOSIS — Z170 Estrogen receptor positive status [ER+]: Secondary | ICD-10-CM | POA: Diagnosis not present

## 2022-10-30 DIAGNOSIS — Z510 Encounter for antineoplastic radiation therapy: Secondary | ICD-10-CM | POA: Diagnosis not present

## 2022-10-30 LAB — RAD ONC ARIA SESSION SUMMARY
Course Elapsed Days: 42
Plan Fractions Treated to Date: 28
Plan Prescribed Dose Per Fraction: 1.8 Gy
Plan Total Fractions Prescribed: 28
Plan Total Prescribed Dose: 50.4 Gy
Reference Point Dosage Given to Date: 50.4 Gy
Reference Point Session Dosage Given: 1.8 Gy
Session Number: 28

## 2022-10-31 ENCOUNTER — Encounter: Payer: Self-pay | Admitting: *Deleted

## 2022-10-31 NOTE — Radiation Completion Notes (Signed)
Patient Name: ERNESTINA, FLASH MRN: [id number] Date of Birth: May 27, 1970 Referring Physician: Rachel Moulds, M.D. Date of Service: 2022-10-31 Radiation Oncologist: Arnette Schaumann, M.D. [facility]                             RADIATION ONCOLOGY END OF TREATMENT NOTE     Diagnosis: C50.412 Malignant neoplasm of upper-outer quadrant of left female breast Staging on 2022-09-04: Malignant neoplasm of upper-outer quadrant of left breast in female, estrogen receptor positive (HCC) T=pT2, N=pN0, M=cM0 Staging on 2022-06-19: Malignant neoplasm of upper-outer quadrant of left breast in female, estrogen receptor positive (HCC) T=cT1c, N=cN0, M=cM0 Intent: Curative     ==========DELIVERED PLANS==========  First Treatment Date: 2022-09-18 - Last Treatment Date: 2022-10-30   Plan Name: Breast_L_BH Site: Breast, Left Technique: 3D Mode: Photon Dose Per Fraction: 1.8 Gy Prescribed Dose (Delivered / Prescribed): 50.4 Gy / 50.4 Gy Prescribed Fxs (Delivered / Prescribed): 28 / 28     ==========ON TREATMENT VISIT DATES========== 2022-09-25, 2022-10-01, 2022-10-08, 2022-10-15, 2022-10-22, 2022-10-28     ==========UPCOMING VISITS==========       ==========APPENDIX - ON TREATMENT VISIT NOTES==========   See weekly On Treatment Notes in Epic for details.

## 2022-11-04 ENCOUNTER — Ambulatory Visit (INDEPENDENT_AMBULATORY_CARE_PROVIDER_SITE_OTHER): Payer: 59 | Admitting: Nurse Practitioner

## 2022-11-04 ENCOUNTER — Encounter: Payer: Self-pay | Admitting: Nurse Practitioner

## 2022-11-04 VITALS — BP 112/80 | HR 80 | Temp 98.4°F | Resp 16 | Ht 67.0 in | Wt 205.6 lb

## 2022-11-04 DIAGNOSIS — Z0001 Encounter for general adult medical examination with abnormal findings: Secondary | ICD-10-CM

## 2022-11-04 DIAGNOSIS — Z1211 Encounter for screening for malignant neoplasm of colon: Secondary | ICD-10-CM

## 2022-11-04 DIAGNOSIS — E7800 Pure hypercholesterolemia, unspecified: Secondary | ICD-10-CM | POA: Diagnosis not present

## 2022-11-04 DIAGNOSIS — R7301 Impaired fasting glucose: Secondary | ICD-10-CM

## 2022-11-04 NOTE — Progress Notes (Signed)
Complete physical exam  Patient: Joanne Wilson   DOB: [birth year]   52 y.o. Female  MRN: [id number] Visit Date: 11/04/2022  Subjective:    Chief Complaint  Patient presents with   Annual Exam    Fasting  Declined Tdap and Shingles- unsure if Oncology approves.   Joanne Wilson is a 52 y.o. female who presents today for a complete physical exam. She reports consuming a general diet.  cardio  She generally feels well. She reports sleeping well. She does not have additional problems to discuss today.  Vision:No Dental:Yes STD Screen:No  BP Readings from Last 3 Encounters:  11/04/22 112/80  09/04/22 119/76  08/21/22 (!) 146/77   Wt Readings from Last 3 Encounters:  11/04/22 205 lb 9.6 oz (93.3 kg)  09/04/22 212 lb 6.4 oz (96.3 kg)  08/21/22 212 lb 5 oz (96.3 kg)   Most recent fall risk assessment:    11/04/2022    2:43 PM  Fall Risk   Falls in the past year? 0  Number falls in past yr: 0  Injury with Fall? 0  Risk for fall due to : No Fall Risks  Follow up Falls evaluation completed   Depression screen:Yes - No Depression  Most recent depression screenings:    11/04/2022    2:43 PM 09/04/2022    1:55 PM  PHQ 2/9 Scores  PHQ - 2 Score 0 1  PHQ- 9 Score 3    HPI  No problem-specific Assessment & Plan notes found for this encounter.  Past Medical History:  Diagnosis Date   Anemia    Thalasemia   Cancer (HCC)    Left Breast   Carpal tunnel syndrome    COVID    2 times -  2020 and 2021   Family history of breast cancer    Finger fracture, left    5th digit (small finger)   GERD (gastroesophageal reflux disease)    PONV (postoperative nausea and vomiting)    PONV (postoperative nausea and vomiting) 08/10/2019   Varicose vein of leg    Past Surgical History:  Procedure Laterality Date   ABDOMINAL HYSTERECTOMY     AUGMENTATION MAMMAPLASTY Bilateral 2014   BREAST BIOPSY Left 06/11/2022   [place] LT BREAST BX W LOC DEV 1ST LESION IMG BX SPEC US GUIDE 06/11/2022 [facility]  MAMMOGRAPHY   BREAST BIOPSY Left 06/11/2022   [place] LT BREAST BX W LOC DEV EA ADD LESION IMG BX SPEC US GUIDE 06/11/2022 [facility] MAMMOGRAPHY   BREAST BIOPSY  07/15/2022   MM LT RADIOACTIVE SEED LOC MAMMO GUIDE 07/15/2022 [facility] MAMMOGRAPHY   BREAST BIOPSY  07/15/2022   MM LT RADIOACTIVE SEED EA ADD LESION LOC MAMMO GUIDE 07/15/2022 [facility] MAMMOGRAPHY   BREAST LUMPECTOMY WITH RADIOACTIVE SEED AND SENTINEL LYMPH NODE BIOPSY Left 07/16/2022   Procedure: LEFT BREAST BRACKETED LUMPECTOMY WITH RADIOACTIVE SEED AND SENTINEL LYMPH NODE BIOPSY;  Surgeon: Harriette Bouillon, MD;  Location: [facility];  Service: General;  Laterality: Left;   LAPAROSCOPIC VAGINAL HYSTERECTOMY WITH SALPINGECTOMY Bilateral 02/25/2018   Procedure: LAPAROSCOPIC ASSISTED VAGINAL HYSTERECTOMY WITH SALPINGECTOMY;  Surgeon: Geryl Rankins, MD;  Location: [facility];  Service: Gynecology;  Laterality: Bilateral;   OPEN REDUCTION INTERNAL FIXATION (ORIF) METACARPAL Left 07/10/2017   Procedure: OPEN REDUCTION INTERNAL FIXATION (ORIF) METACARPAL;  Surgeon: Bradly Bienenstock, MD;  Location: [facility];  Service: Orthopedics;  Laterality: Left;   RE-EXCISION OF BREAST LUMPECTOMY Left 08/08/2022   Procedure: RE-EXCISION OF LEFT BREAST LUMPECTOMY;  Surgeon: Harriette Bouillon,  MD;  Location: [facility];  Service: General;  Laterality: Left;   TUBAL LIGATION Bilateral 2005   PPTL   VARICOSE VEIN SURGERY Bilateral    surgery years ago and sclerothraphy ~ 2015   Social History   Socioeconomic History   Marital status: Divorced    Spouse name: Not on file   Number of children: 3   Years of education: Not on file   Highest education level: Not on file  Occupational History   Occupation: CMA, [employer]   Tobacco Use   Smoking status: Never   Smokeless tobacco: Never  Vaping Use   Vaping status: Never Used  Substance and Sexual Activity   Alcohol use: No   Drug use: No   Sexual activity: Not on file  Other Topics Concern   Not on  file  Social History Narrative   Household:pt and one of her daughters    Social Determinants of Health   Financial Resource Strain: Low Risk  (06/19/2022)   Overall Financial Resource Strain (CARDIA)    Difficulty of Paying Living Expenses: Not very hard  Food Insecurity: No Food Insecurity (09/04/2022)   Hunger Vital Sign    Worried About Running Out of Food in the Last Year: Never true    Ran Out of Food in the Last Year: Never true  Transportation Needs: No Transportation Needs (06/19/2022)   PRAPARE - Administrator, Civil Service (Medical): No    Lack of Transportation (Non-Medical): No  Physical Activity: Not on file  Stress: Not on file  Social Connections: Not on file  Intimate Partner Violence: Not At Risk (09/04/2022)   Humiliation, Afraid, Rape, and Kick questionnaire    Fear of Current or Ex-Partner: No    Emotionally Abused: No    Physically Abused: No    Sexually Abused: No   Family Status  Relation Name Status   Mother  Alive   Father  Deceased   Sister 4 pat half Alive   Sister 2 pat half twinse Deceased   Brother mat 1/2 Deceased   Brother 2 mat 1/2 Armed forces training and education officer   Brother 3 pat 1/2 Alive   Mat Aunt  Alive   Pat Uncle x3 Alive   MGM  Alive   MGF  Deceased   PGM  Deceased   PGF  Deceased   Cousin  Alive   Other 2 mat great aunt Deceased   Neg Hx  (Not Specified)  No partnership data on file   Family History  Problem Relation Age of Onset   Cancer Mother 28       breast cancer   Breast cancer Mother 68       second cancer at 84   Hyperlipidemia Father    Kidney disease Father    Hypertension Father    Insurance account manager Parkinson White syndrome Brother    Cancer Maternal Grandmother 65       breast   Breast cancer Maternal Grandmother 63       second cancer at 34   Breast cancer Cousin        mother's maternal first cousin dx 2x under 50   Breast cancer Other    Colon cancer Neg Hx    No Known Allergies  Patient Care Team: , Bonna Gains, NP as  PCP - General (Internal Medicine) Patient, No Pcp Per (General Practice) Harriette Bouillon, MD as Consulting Physician (General Surgery) Rachel Moulds, MD as Consulting Physician (Hematology and Oncology) [place],  Fayrene Fearing, MD as Consulting Physician (Radiation Oncology) Pershing Proud, RN as Oncology Nurse Navigator Donnelly Angelica, RN as Oncology Nurse Navigator   Medications: Outpatient Medications Prior to Visit  Medication Sig   ASHWAGANDHA PO Take 2 capsules by mouth 3 (three) times a week.   MAGNESIUM PO Apply 1 spray topically at bedtime.   OIL OF OREGANO PO Take by mouth.   OVER THE COUNTER MEDICATION Take 5 mLs by mouth 3 (three) times a week. Black seed oil   UNABLE TO FIND Take 1 capsule by mouth 3 (three) times a week. Sea Moss   UNABLE TO FIND Med Name: Sour sop   [DISCONTINUED] Omega-3 Fatty Acids (FISH OIL OMEGA-3 PO) Take 1 capsule by mouth once a week. (Patient not taking: Reported on 11/04/2022)   No facility-administered medications prior to visit.    Review of Systems  Constitutional:  Negative for activity change, appetite change and unexpected weight change.  Respiratory: Negative.    Cardiovascular: Negative.   Gastrointestinal: Negative.   Endocrine: Negative for cold intolerance and heat intolerance.  Genitourinary: Negative.   Musculoskeletal: Negative.   Skin: Negative.   Neurological: Negative.   Hematological: Negative.   Psychiatric/Behavioral:  Negative for behavioral problems, decreased concentration, dysphoric mood, hallucinations, self-injury, sleep disturbance and suicidal ideas. The patient is not nervous/anxious.         Objective:  BP 112/80 (BP Location: Right Arm, Patient Position: Sitting, Cuff Size: Large)   Pulse 80   Temp 98.4 F (36.9 C) (Temporal)   Resp 16   Ht 5\' 7"  (1.702 m)   Wt 205 lb 9.6 oz (93.3 kg)   LMP 02/13/2018   SpO2 96%   BMI 32.20 kg/m     Physical Exam Vitals and nursing note reviewed.  Constitutional:       General: She is not in acute distress. HENT:     Right Ear: Tympanic membrane, ear canal and external ear normal.     Left Ear: Tympanic membrane, ear canal and external ear normal.     Nose: Nose normal.  Eyes:     Extraocular Movements: Extraocular movements intact.     Conjunctiva/sclera: Conjunctivae normal.     Pupils: Pupils are equal, round, and reactive to light.  Neck:     Thyroid: No thyroid mass, thyromegaly or thyroid tenderness.  Cardiovascular:     Rate and Rhythm: Normal rate and regular rhythm.     Pulses: Normal pulses.     Heart sounds: Normal heart sounds.  Pulmonary:     Effort: Pulmonary effort is normal.     Breath sounds: Normal breath sounds.  Abdominal:     General: Bowel sounds are normal.     Palpations: Abdomen is soft.  Musculoskeletal:        General: Normal range of motion.     Cervical back: Normal range of motion and neck supple.     Right lower leg: No edema.     Left lower leg: No edema.  Lymphadenopathy:     Cervical: No cervical adenopathy.  Skin:    General: Skin is warm and dry.  Neurological:     Mental Status: She is alert and oriented to person, place, and time.     Cranial Nerves: No cranial nerve deficit.  Psychiatric:        Mood and Affect: Mood normal.        Behavior: Behavior normal.        Thought Content: Thought content  normal.      No results found for any visits on 11/04/22.    Assessment & Plan:    Routine Health Maintenance and Physical Exam  Immunization History  Administered Date(s) Administered   Influenza-Unspecified 01/03/2022   MMR 07/24/1998   Td 07/24/1998    Health Maintenance  Topic Date Due   Colonoscopy  Never done   INFLUENZA VACCINE  10/31/2022   Hepatitis C Screening  02/16/2023 (Originally 10/02/1988)   HIV Screening  02/16/2023 (Originally 10/02/1985)   Zoster Vaccines- Shingrix (1 of 2) 03/02/2023 (Originally 10/02/1989)   MAMMOGRAM  06/03/2023   HPV VACCINES  Aged Out   DTaP/Tdap/Td   Discontinued   COVID-19 Vaccine  Discontinued    Discussed health benefits of physical activity, and encouraged her to engage in regular exercise appropriate for her age and condition.  Problem List Items Addressed This Visit       Endocrine   IFG (impaired fasting glucose)   Relevant Orders   Hemoglobin A1c     Other   Pure hypercholesterolemia   Relevant Orders   Lipid panel   Other Visit Diagnoses     Encounter for preventative adult health care exam with abnormal findings    -  Primary   Relevant Orders   Hepatic function panel   Colon cancer screening       Relevant Orders   Ambulatory referral to Gastroenterology      Return in about 1 year (around 11/04/2023) for CPE (fasting).     Alysia Penna, NP

## 2022-11-04 NOTE — Patient Instructions (Addendum)
Go to lab Continue Heart healthy diet and daily exercise. Maintain calcium 800mg  and vit D3 1000IU daily for bone health Schedule appointment for annual eye exam. Discuss need for genetic test with oncology  Preventive Care 52-52 Years Old, Female Preventive care refers to lifestyle choices and visits with your health care provider that can promote health and wellness. Preventive care visits are also called wellness exams. What can I expect for my preventive care visit? Counseling Your health care provider may ask you questions about your: Medical history, including: Past medical problems. Family medical history. Pregnancy history. Current health, including: Menstrual cycle. Method of birth control. Emotional well-being. Home life and relationship well-being. Sexual activity and sexual health. Lifestyle, including: Alcohol, nicotine or tobacco, and drug use. Access to firearms. Diet, exercise, and sleep habits. Work and work Astronomer. Sunscreen use. Safety issues such as seatbelt and bike helmet use. Physical exam Your health care provider will check your: Height and weight. These may be used to calculate your BMI (body mass index). BMI is a measurement that tells if you are at a healthy weight. Waist circumference. This measures the distance around your waistline. This measurement also tells if you are at a healthy weight and may help predict your risk of certain diseases, such as type 2 diabetes and high blood pressure. Heart rate and blood pressure. Body temperature. Skin for abnormal spots. What immunizations do I need?  Vaccines are usually given at various ages, according to a schedule. Your health care provider will recommend vaccines for you based on your age, medical history, and lifestyle or other factors, such as travel or where you work. What tests do I need? Screening Your health care provider may recommend screening tests for certain conditions. This may  include: Lipid and cholesterol levels. Diabetes screening. This is done by checking your blood sugar (glucose) after you have not eaten for a while (fasting). Pelvic exam and Pap test. Hepatitis B test. Hepatitis C test. HIV (human immunodeficiency virus) test. STI (sexually transmitted infection) testing, if you are at risk. Lung cancer screening. Colorectal cancer screening. Mammogram. Talk with your health care provider about when you should start having regular mammograms. This may depend on whether you have a family history of breast cancer. BRCA-related cancer screening. This may be done if you have a family history of breast, ovarian, tubal, or peritoneal cancers. Bone density scan. This is done to screen for osteoporosis. Talk with your health care provider about your test results, treatment options, and if necessary, the need for more tests. Follow these instructions at home: Eating and drinking  Eat a diet that includes fresh fruits and vegetables, whole grains, lean protein, and low-fat dairy products. Take vitamin and mineral supplements as recommended by your health care provider. Do not drink alcohol if: Your health care provider tells you not to drink. You are pregnant, may be pregnant, or are planning to become pregnant. If you drink alcohol: Limit how much you have to 0-1 drink a day. Know how much alcohol is in your drink. In the [place] one drink equals one 12 oz bottle of beer (355 mL), one 5 oz glass of wine (148 mL), or one 1 oz glass of hard liquor (44 mL). Lifestyle Brush your teeth every morning and night with fluoride toothpaste. Floss one time each day. Exercise for at least 30 minutes 5 or more days each week. Do not use any products that contain nicotine or tobacco. These products include cigarettes, chewing tobacco, and vaping  devices, such as e-cigarettes. If you need help quitting, ask your health care provider. Do not use drugs. If you are sexually  active, practice safe sex. Use a condom or other form of protection to prevent STIs. If you do not wish to become pregnant, use a form of birth control. If you plan to become pregnant, see your health care provider for a prepregnancy visit. Take aspirin only as told by your health care provider. Make sure that you understand how much to take and what form to take. Work with your health care provider to find out whether it is safe and beneficial for you to take aspirin daily. Find healthy ways to manage stress, such as: Meditation, yoga, or listening to music. Journaling. Talking to a trusted person. Spending time with friends and family. Minimize exposure to UV radiation to reduce your risk of skin cancer. Safety Always wear your seat belt while driving or riding in a vehicle. Do not drive: If you have been drinking alcohol. Do not ride with someone who has been drinking. When you are tired or distracted. While texting. If you have been using any mind-altering substances or drugs. Wear a helmet and other protective equipment during sports activities. If you have firearms in your house, make sure you follow all gun safety procedures. Seek help if you have been physically or sexually abused. What's next? Visit your health care provider once a year for an annual wellness visit. Ask your health care provider how often you should have your eyes and teeth checked. Stay up to date on all vaccines. This information is not intended to replace advice given to you by your health care provider. Make sure you discuss any questions you have with your health care provider. Document Revised: 09/13/2020 Document Reviewed: 09/13/2020 Elsevier Patient Education  2024 [employer].

## 2022-11-07 NOTE — Addendum Note (Signed)
Addended by: Michaela Corner on: 11/07/2022 03:47 PM   Modules accepted: Orders

## 2022-11-22 ENCOUNTER — Other Ambulatory Visit (HOSPITAL_COMMUNITY): Payer: Self-pay

## 2022-11-22 ENCOUNTER — Inpatient Hospital Stay: Payer: 59 | Attending: Genetic Counselor | Admitting: Hematology and Oncology

## 2022-11-22 ENCOUNTER — Encounter: Payer: Self-pay | Admitting: Hematology and Oncology

## 2022-11-22 VITALS — BP 133/85 | HR 87 | Temp 97.9°F | Resp 18 | Ht 67.0 in | Wt 203.7 lb

## 2022-11-22 DIAGNOSIS — Z79810 Long term (current) use of selective estrogen receptor modulators (SERMs): Secondary | ICD-10-CM | POA: Diagnosis not present

## 2022-11-22 DIAGNOSIS — C50412 Malignant neoplasm of upper-outer quadrant of left female breast: Secondary | ICD-10-CM | POA: Insufficient documentation

## 2022-11-22 DIAGNOSIS — Z170 Estrogen receptor positive status [ER+]: Secondary | ICD-10-CM | POA: Insufficient documentation

## 2022-11-22 MED ORDER — TAMOXIFEN CITRATE 20 MG PO TABS
20.0000 mg | ORAL_TABLET | Freq: Every day | ORAL | 3 refills | Status: DC
  Filled 2022-11-22: qty 90, 90d supply, fill #0
  Filled 2023-02-21: qty 90, 90d supply, fill #1
  Filled 2023-06-03: qty 90, 90d supply, fill #2
  Filled 2023-08-27: qty 90, 90d supply, fill #3

## 2022-11-22 NOTE — Addendum Note (Signed)
Addended by: Jackquline Denmark on: 11/22/2022 01:02 PM   Modules accepted: Level of Service

## 2022-11-22 NOTE — Progress Notes (Signed)
[facility] CONSULT NOTE  Patient Care Team: Nche, Bonna Gains, NP as PCP - General (Internal Medicine) Patient, No Pcp Per (General Practice) Harriette Bouillon, MD as Consulting Physician (General Surgery) Rachel Moulds, MD as Consulting Physician (Hematology and Oncology) Antony Blackbird, MD as Consulting Physician (Radiation Oncology) Pershing Proud, RN as Oncology Nurse Navigator Donnelly Angelica, RN as Oncology Nurse Navigator  CHIEF COMPLAINTS/PURPOSE OF CONSULTATION:  Newly diagnosed breast cancer  HISTORY OF PRESENTING ILLNESS:  Breast Joanne Wilson 52 y.o. female is here because of recent diagnosis of left breast cancer  I reviewed her records extensively and collaborated the history with the patient.  SUMMARY OF ONCOLOGIC HISTORY: Oncology History  Malignant neoplasm of upper-outer quadrant of left breast in female, estrogen receptor positive (HCC)  06/18/2022 Initial Diagnosis   Malignant neoplasm of upper-outer quadrant of left breast in female, estrogen receptor positive (HCC)   09/04/2022 Cancer Staging   Staging form: Breast, AJCC 8th Edition - Pathologic: Stage IA (pT2, pN0, cM0, G1, ER+, PR+, HER2-, Oncotype DX score: 6) - Signed by Antony Blackbird, MD on 09/04/2022 Multigene prognostic tests performed: Oncotype DX Recurrence score range: Less than 11 Histologic grading system: 3 grade system    Patient had bilateral screening mammogram which showed a possible asymmetry in the left breast warranting further investigation.  On diagnostic mammogram, there were 2 adjacent suspicious masses in the left breast 2 o'clock position 7 cm from the nipple. Pathology from the left breast needle core biopsy showed grade 1 invasive lobular carcinoma, prognostic showed ER +95% strong staining PR 95% positive strong staining Ki-67 of 10%.  According to the pathologist, both's masses appear identical under the microscope.  She had left breast lumpectomy, final  pathology showed 5 cm, invasive globular carcinoma, grade 1, margins positive hence reexcised negative margin.  ER 95% positive strong staining PR 95% positive strong staining, KI of 10% HER2 1+, no evidence of lymph node involvement.  Oncotype DX of 6, distant risk recurrence at 9 years of 3%, no benefit from adjuvant chemotherapy.   [name] is now postradiation, tolerated this well except for bad sunburn.  Rest of the pertinent 10 point ROS reviewed and negative  MEDICAL HISTORY:  Past Medical History:  Diagnosis Date   Anemia    Thalasemia   Cancer (HCC)    Left Breast   Carpal tunnel syndrome    COVID    2 times -  2020 and 2021   Family history of breast cancer    Finger fracture, left    5th digit (small finger)   GERD (gastroesophageal reflux disease)    PONV (postoperative nausea and vomiting)    PONV (postoperative nausea and vomiting) 08/10/2019   Varicose vein of leg     SURGICAL HISTORY: Past Surgical History:  Procedure Laterality Date   ABDOMINAL HYSTERECTOMY     AUGMENTATION MAMMAPLASTY Bilateral 2014   BREAST BIOPSY Left 06/11/2022   [place] LT BREAST BX W LOC DEV 1ST LESION IMG BX SPEC US GUIDE 06/11/2022 [facility]   BREAST BIOPSY Left 06/11/2022   [place] LT BREAST BX W LOC DEV EA ADD LESION IMG BX SPEC US GUIDE 06/11/2022 [facility]   BREAST BIOPSY  07/15/2022   MM LT RADIOACTIVE SEED LOC MAMMO GUIDE 07/15/2022 [facility]   BREAST BIOPSY  07/15/2022   MM LT RADIOACTIVE SEED EA ADD LESION LOC MAMMO GUIDE 07/15/2022 [facility]   BREAST LUMPECTOMY WITH RADIOACTIVE SEED AND SENTINEL LYMPH NODE BIOPSY  Left 07/16/2022   Procedure: LEFT BREAST BRACKETED LUMPECTOMY WITH RADIOACTIVE SEED AND SENTINEL LYMPH NODE BIOPSY;  Surgeon: Harriette Bouillon, MD;  Location: [facility];  Service: General;  Laterality: Left;   LAPAROSCOPIC VAGINAL HYSTERECTOMY WITH SALPINGECTOMY Bilateral 02/25/2018   Procedure: LAPAROSCOPIC ASSISTED VAGINAL HYSTERECTOMY WITH  SALPINGECTOMY;  Surgeon: Geryl Rankins, MD;  Location: [facility];  Service: Gynecology;  Laterality: Bilateral;   OPEN REDUCTION INTERNAL FIXATION (ORIF) METACARPAL Left 07/10/2017   Procedure: OPEN REDUCTION INTERNAL FIXATION (ORIF) METACARPAL;  Surgeon: Bradly Bienenstock, MD;  Location: [facility];  Service: Orthopedics;  Laterality: Left;   RE-EXCISION OF BREAST LUMPECTOMY Left 08/08/2022   Procedure: RE-EXCISION OF LEFT BREAST LUMPECTOMY;  Surgeon: Harriette Bouillon, MD;  Location: [facility];  Service: General;  Laterality: Left;   TUBAL LIGATION Bilateral 2005   PPTL   VARICOSE VEIN SURGERY Bilateral    surgery years ago and sclerothraphy ~ 2015    SOCIAL HISTORY: Social History   Socioeconomic History   Marital status: Divorced    Spouse name: Not on file   Number of children: 3   Years of education: Not on file   Highest education level: Not on file  Occupational History   Occupation: CMA, [employer]   Tobacco Use   Smoking status: Never   Smokeless tobacco: Never  Vaping Use   Vaping status: Never Used  Substance and Sexual Activity   Alcohol use: No   Drug use: No   Sexual activity: Not on file  Other Topics Concern   Not on file  Social History Narrative   Household:pt and one of her daughters    Social Determinants of Health   Financial Resource Strain: Low Risk  (06/19/2022)   Overall Financial Resource Strain (CARDIA)    Difficulty of Paying Living Expenses: Not very hard  Food Insecurity: No Food Insecurity (09/04/2022)   Hunger Vital Sign    Worried About Running Out of Food in the Last Year: Never true    Ran Out of Food in the Last Year: Never true  Transportation Needs: No Transportation Needs (06/19/2022)   PRAPARE - Administrator, Civil Service (Medical): No    Lack of Transportation (Non-Medical): No  Physical Activity: Not on file  Stress: Not on file  Social Connections: Not on file  Intimate Partner Violence: Not At  Risk (09/04/2022)   Humiliation, Afraid, Rape, and Kick questionnaire    Fear of Current or Ex-Partner: No    Emotionally Abused: No    Physically Abused: No    Sexually Abused: No    FAMILY HISTORY: Family History  Problem Relation Age of Onset   Cancer Mother 1       breast cancer   Breast cancer Mother 78       second cancer at 37   Hyperlipidemia Father    Kidney disease Father    Hypertension Father    Evelene Croon Parkinson White syndrome Brother    Cancer Maternal Grandmother 60       breast   Breast cancer Maternal Grandmother 60       second cancer at 68   Breast cancer Cousin        mother's maternal first cousin dx 2x under 50   Breast cancer Other    Colon cancer Neg Hx     ALLERGIES:  has No Known Allergies.  MEDICATIONS:  Current Outpatient Medications  Medication Sig Dispense Refill   tamoxifen (NOLVADEX) 20 MG tablet  Take 1 tablet (20 mg total) by mouth daily. 90 tablet 3   ASHWAGANDHA PO Take 2 capsules by mouth 3 (three) times a week.     MAGNESIUM PO Apply 1 spray topically at bedtime.     OIL OF OREGANO PO Take by mouth.     OVER THE COUNTER MEDICATION Take 5 mLs by mouth 3 (three) times a week. Black seed oil     UNABLE TO FIND Take 1 capsule by mouth 3 (three) times a week. Sea Moss     UNABLE TO FIND Med Name: Sour sop     No current facility-administered medications for this visit.    REVIEW OF SYSTEMS:   Constitutional: Denies fevers, chills or abnormal night sweats Eyes: Denies blurriness of vision, double vision or watery eyes Ears, nose, mouth, throat, and face: Denies mucositis or sore throat Respiratory: Denies cough, dyspnea or wheezes Cardiovascular: Denies palpitation, chest discomfort or lower extremity swelling Gastrointestinal:  Denies nausea, heartburn or change in bowel habits Skin: Denies abnormal skin rashes Lymphatics: Denies new lymphadenopathy or easy bruising Neurological:Denies numbness, tingling or new  weaknesses Behavioral/Psych: Mood is stable, no new changes  Breast: Denies any palpable lumps or discharge All other systems were reviewed with the patient and are negative.  PHYSICAL EXAMINATION: ECOG PERFORMANCE STATUS: 0 - Asymptomatic  Vitals:   11/22/22 1138  BP: 133/85  Pulse: 87  Resp: 18  Temp: 97.9 F (36.6 C)  SpO2: 100%   Filed Weights   11/22/22 1138  Weight: 203 lb 11.2 oz (92.4 kg)    GENERAL:alert, no distress and comfortable Left breast status postradiation changes. Desquamation seen. Healing well.  LABORATORY DATA:  I have reviewed the data as listed Lab Results  Component Value Date   WBC 4.7 08/08/2022   HGB 11.7 (L) 08/08/2022   HCT 37.4 08/08/2022   MCV 81.5 08/08/2022   PLT 280 08/08/2022   Lab Results  Component Value Date   NA 137 08/08/2022   K 4.2 08/08/2022   CL 108 08/08/2022   CO2 23 08/08/2022    RADIOGRAPHIC STUDIES: I have personally reviewed the radiological reports and agreed with the findings in the report.  ASSESSMENT AND PLAN:  Malignant neoplasm of upper-outer quadrant of left breast in female, estrogen receptor positive (HCC) This is a very pleasant 52 year old female patient with newly diagnosed left breast invasive lobular carcinoma, grade 1, ER/PR positive HER2 negative Ki-67 of 20% referred to breast [facility] for additional recommendations.  Given low-grade tumor, lobular histology and a strong ER/PR positivity she will proceed with upfront surgery first followed by Oncotype testing.  Since her last visit here she had left breast lumpectomy which showed 5 cm invasive lobular carcinoma, low-grade, positive margins reexcised with negative margin, strong ER/PR positivity 95%, Ki-67 of 10% and no evidence of lymph node involvement.  Oncotype DX was low at 6, hence no benefit from adjuvant chemotherapy.  She is now status post adjuvant radiation.  She is perimenopausal hence we will proceed with tamoxifen for antiestrogen therapy.  I  once again reviewed the mechanism of action, adverse effects for antiestrogen therapy including but not limited to postmenopausal symptoms such as hot flashes, vaginal discharge, mood swings, arthralgias, risk of DVT/PE, endometrial hyperplasia and rarely endometrial carcinoma.  We have reviewed the benefit of tamoxifen including bone density.  She is willing to proceed with antiestrogen therapy.  She will return to clinic in 3 months approximately for survivorship visit as well as toxicity check  Total time spent: 30 minutes including history, physical exam, review of records, counseling and coordination of care All questions were answered. The patient knows to call the clinic with any problems, questions or concerns.    Rachel Moulds, MD 11/22/22

## 2022-11-22 NOTE — Assessment & Plan Note (Addendum)
This is a very pleasant 52 year old female patient with newly diagnosed left breast invasive lobular carcinoma, grade 1, ER/PR positive HER2 negative Ki-67 of 20% referred to breast [facility] for additional recommendations.  Given low-grade tumor, lobular histology and a strong ER/PR positivity she will proceed with upfront surgery first followed by Oncotype testing.  Since her last visit here she had left breast lumpectomy which showed 5 cm invasive lobular carcinoma, low-grade, positive margins reexcised with negative margin, strong ER/PR positivity 95%, Ki-67 of 10% and no evidence of lymph node involvement.  Oncotype DX was low at 6, hence no benefit from adjuvant chemotherapy.  She is now status post adjuvant radiation.  She is perimenopausal hence we will proceed with tamoxifen for antiestrogen therapy.  I once again reviewed the mechanism of action, adverse effects for antiestrogen therapy including but not limited to postmenopausal symptoms such as hot flashes, vaginal discharge, mood swings, arthralgias, risk of DVT/PE, endometrial hyperplasia and rarely endometrial carcinoma.  We have reviewed the benefit of tamoxifen including bone density.  She is willing to proceed with antiestrogen therapy.  She will return to clinic in 3 months approximately for survivorship visit as well as toxicity check

## 2022-12-06 ENCOUNTER — Other Ambulatory Visit (HOSPITAL_COMMUNITY): Payer: Self-pay

## 2022-12-08 NOTE — Progress Notes (Signed)
  Radiation Oncology         [phone] ________________________________  Name: Joanne Wilson MRN: [id number]  Date: 12/09/2022  DOB: [birth year]  End of Treatment Note  Diagnosis: Stage IA (pT2, pN0, cM0) Left Breast UOQ, Invasive and in situ lobular carcinoma, ER+ / PR+ / Her2-, Grade 1: s/p lumpectomy and SLN excisions      Indication for treatment: Curative        Radiation treatment dates: 09/18/22 through 10/30/22   Site/dose: Left breast - 50.04 Gy delivered in 28 Fx at 1.8 Gy/Fx  Technique/Mode: 3D / Photon   Beams/energy: 6X , 10X  Narrative: The patient tolerated radiation treatment relatively well. During her final weekly treatment check on 10/28/22, the patient endorsed pain, redness, peeling, mild drainage, swelling and tenderness to the left breast. She also endorsed some fatigue. Physical exam performed on that same date showed hyperpigmentation changes throughout the left breast area, swelling, as well as some skin breakdown in the lower axillary and the inframammary fold area. No signs of infection were appreciated. She was given Silvadene to place on the areas of skin breakdown.  Plan: The patient has completed radiation treatment. The patient will return to radiation oncology clinic for routine followup in one month. I advised them to call or return sooner if they have any questions or concerns related to their recovery or treatment.  -----------------------------------  Billie Lade, PhD, MD  This document serves as a record of services personally performed by Antony Blackbird, MD. It was created on his behalf by Neena Rhymes, a trained medical scribe. The creation of this record is based on the scribe's personal observations and the provider's statements to them. This document has been checked and approved by the attending provider.

## 2022-12-08 NOTE — Progress Notes (Signed)
Radiation Oncology         ([phone]) [phone] ________________________________  Name: Joanne Wilson MRN: [id number]  Date: 12/09/2022  DOB: 08-01-1970  Follow-Up Visit Note  CC: Nche, Bonna Gains, NP  Nche, Bonna Gains, NP  No diagnosis found.  Diagnosis: Stage IA (pT2, pN0, cM0) Left Breast UOQ, Invasive and in situ lobular carcinoma, ER+ / PR+ / Her2-, Grade 1: s/p lumpectomy and SLN excisions   Interval Since Last Radiation: 1 month and 8 days   Indication for treatment: Curative       Radiation treatment dates: 09/18/22 through 10/30/22  Site/dose: Left breast - 50.04 Gy delivered in 28 Fx at 1.8 Gy/Fx Technique/Mode: 3D / Photon  Beams/energy: 6X , 10X  Narrative:  The patient returns today for routine follow-up. The patient tolerated radiation treatment relatively well. During her final weekly treatment check on 10/28/22, the patient endorsed pain, redness, peeling, mild drainage, swelling and tenderness to the left breast. She also endorsed some fatigue. Physical exam performed on that same date showed hyperpigmentation changes throughout the left breast area, swelling, as well as some skin breakdown in the lower axillary and the inframammary fold area. No signs of infection were appreciated. She was given Silvadene to place on the areas of skin breakdown.     Since completing radiation therapy, the patient followed up with Dr. Al Pimple on 11/22/22. During which time, the patient consented to proceed with antiestrogen therapy consisting of tamoxifen based on her perimenopausal status. She will return to Dr. Al Pimple in approximately 3 months to review her survivorship care plan and for a toxicity check.   No other significant interval history since the patient completed radiation therapy or in the interval since her initial consultation date.   ***                          Allergies:  has No Known Allergies.  Meds: Current Outpatient Medications  Medication Sig Dispense Refill    ASHWAGANDHA PO Take 2 capsules by mouth 3 (three) times a week.     MAGNESIUM PO Apply 1 spray topically at bedtime.     OIL OF OREGANO PO Take by mouth.     OVER THE COUNTER MEDICATION Take 5 mLs by mouth 3 (three) times a week. Black seed oil     tamoxifen (NOLVADEX) 20 MG tablet Take 1 tablet (20 mg total) by mouth daily. 90 tablet 3   UNABLE TO FIND Take 1 capsule by mouth 3 (three) times a week. Sea Moss     UNABLE TO FIND Med Name: Sour sop     No current facility-administered medications for this encounter.    Physical Findings: The patient is in no acute distress. Patient is alert and oriented.  vitals were not taken for this visit. .  No significant changes. Lungs are clear to auscultation bilaterally. Heart has regular rate and rhythm. No palpable cervical, supraclavicular, or axillary adenopathy. Abdomen soft, non-tender, normal bowel sounds.  Right Breast: no palpable mass, nipple discharge or bleeding. Left Breast: ***  Lab Findings: Lab Results  Component Value Date   WBC 4.7 08/08/2022   HGB 11.7 (L) 08/08/2022   HCT 37.4 08/08/2022   MCV 81.5 08/08/2022   PLT 280 08/08/2022    Radiographic Findings: No results found.  Impression: Stage IA (pT2, pN0, cM0) Left Breast UOQ, Invasive and in situ lobular carcinoma, ER+ / PR+ / Her2-, Grade 1: s/p lumpectomy and SLN excisions  The patient is recovering from the effects of radiation.  ***  Plan:  ***   *** minutes of total time was spent for this patient encounter, including preparation, face-to-face counseling with the patient and coordination of care, physical exam, and documentation of the encounter. ____________________________________  Billie Lade, PhD, MD  This document serves as a record of services personally performed by Antony Blackbird, MD. It was created on his behalf by Neena Rhymes, a trained medical scribe. The creation of this record is based on the scribe's personal observations and the  provider's statements to them. This document has been checked and approved by the attending provider.

## 2022-12-09 ENCOUNTER — Ambulatory Visit
Admission: RE | Admit: 2022-12-09 | Discharge: 2022-12-09 | Disposition: A | Discharge status: Home / Self Care | Payer: 59 | Source: Ambulatory Visit | Attending: Radiation Oncology | Admitting: Radiation Oncology

## 2022-12-09 ENCOUNTER — Encounter: Payer: Self-pay | Admitting: Radiation Oncology

## 2022-12-09 ENCOUNTER — Ambulatory Visit: Payer: 59 | Attending: Surgery

## 2022-12-09 VITALS — BP 124/89 | HR 79 | Temp 98.2°F | Resp 17 | Ht 67.0 in | Wt 202.5 lb

## 2022-12-09 VITALS — Wt 204.4 lb

## 2022-12-09 DIAGNOSIS — Z923 Personal history of irradiation: Secondary | ICD-10-CM | POA: Insufficient documentation

## 2022-12-09 DIAGNOSIS — Z483 Aftercare following surgery for neoplasm: Secondary | ICD-10-CM | POA: Insufficient documentation

## 2022-12-09 DIAGNOSIS — C50412 Malignant neoplasm of upper-outer quadrant of left female breast: Secondary | ICD-10-CM | POA: Diagnosis not present

## 2022-12-09 DIAGNOSIS — Z170 Estrogen receptor positive status [ER+]: Secondary | ICD-10-CM | POA: Diagnosis not present

## 2022-12-09 NOTE — Progress Notes (Signed)
[name] is here today for follow up post radiation to the breast.   Breast Side:Left    They completed their radiation on 10/30/2022  Does the patient complain of any of the following: Post radiation skin issues: No Breast Tenderness: No Breast Swelling: No Lymphadema: No Range of Motion limitations: No Fatigue post radiation: No Appetite good/fair/poor: Good   BP (P) 124/89 (BP Location: Right Arm, Patient Position: Sitting)   Pulse (P) 79   Temp (P) 98.2 F (36.8 C) (Oral)   Resp (P) 18   Ht 5\' 7"  (1.702 m)   Wt (P) 202 lb 8 oz (91.9 kg)   LMP 02/13/2018   SpO2 (P) 100%   BMI (P) 31.72 kg/m

## 2022-12-09 NOTE — Therapy (Signed)
OUTPATIENT PHYSICAL THERAPY SOZO SCREENING NOTE   Patient Name: Joanne Wilson MRN: [id number] DOB:1970-10-09, 52 y.o., female Today's Date: 12/09/2022  PCP: Anne Ng, NP REFERRING PROVIDER: Harriette Bouillon, MD   PT End of Session - 12/09/22 1647     Visit Number 2   # unchanged due to screen only   PT Start Time 1645    PT Stop Time 1651    PT Time Calculation (min) 6 min    Activity Tolerance Patient tolerated treatment well    Behavior During Therapy WFL for tasks assessed/performed             Past Medical History:  Diagnosis Date   Anemia    Thalasemia   Cancer (HCC)    Left Breast   Carpal tunnel syndrome    COVID    2 times -  2020 and 2021   Family history of breast cancer    Finger fracture, left    5th digit (small finger)   GERD (gastroesophageal reflux disease)    PONV (postoperative nausea and vomiting)    PONV (postoperative nausea and vomiting) 08/10/2019   Varicose vein of leg    Past Surgical History:  Procedure Laterality Date   ABDOMINAL HYSTERECTOMY     AUGMENTATION MAMMAPLASTY Bilateral 2014   BREAST BIOPSY Left 06/11/2022   [place] LT BREAST BX W LOC DEV 1ST LESION IMG BX SPEC US GUIDE 06/11/2022 [facility]   BREAST BIOPSY Left 06/11/2022   [place] LT BREAST BX W LOC DEV EA ADD LESION IMG BX SPEC US GUIDE 06/11/2022 [facility]   BREAST BIOPSY  07/15/2022   MM LT RADIOACTIVE SEED LOC MAMMO GUIDE 07/15/2022 [facility]   BREAST BIOPSY  07/15/2022   MM LT RADIOACTIVE SEED EA ADD LESION LOC MAMMO GUIDE 07/15/2022 [facility]   BREAST LUMPECTOMY WITH RADIOACTIVE SEED AND SENTINEL LYMPH NODE BIOPSY Left 07/16/2022   Procedure: LEFT BREAST BRACKETED LUMPECTOMY WITH RADIOACTIVE SEED AND SENTINEL LYMPH NODE BIOPSY;  Surgeon: Harriette Bouillon, MD;  Location: [facility];  Service: General;  Laterality: Left;   LAPAROSCOPIC VAGINAL HYSTERECTOMY WITH SALPINGECTOMY Bilateral 02/25/2018   Procedure: LAPAROSCOPIC ASSISTED VAGINAL  HYSTERECTOMY WITH SALPINGECTOMY;  Surgeon: Geryl Rankins, MD;  Location: [facility];  Service: Gynecology;  Laterality: Bilateral;   OPEN REDUCTION INTERNAL FIXATION (ORIF) METACARPAL Left 07/10/2017   Procedure: OPEN REDUCTION INTERNAL FIXATION (ORIF) METACARPAL;  Surgeon: Bradly Bienenstock, MD;  Location: [facility];  Service: Orthopedics;  Laterality: Left;   RE-EXCISION OF BREAST LUMPECTOMY Left 08/08/2022   Procedure: RE-EXCISION OF LEFT BREAST LUMPECTOMY;  Surgeon: Harriette Bouillon, MD;  Location: [facility];  Service: General;  Laterality: Left;   TUBAL LIGATION Bilateral 2005   PPTL   VARICOSE VEIN SURGERY Bilateral    surgery years ago and sclerothraphy ~ 2015   Patient Active Problem List   Diagnosis Date Noted   Pure hypercholesterolemia 11/04/2022   IFG (impaired fasting glucose) 11/04/2022   Genetic testing 06/24/2022   Malignant neoplasm of upper-outer quadrant of left breast in female, estrogen receptor positive (HCC) 06/18/2022   Breast implant status 02/15/2022   Family history of breast cancer gene mutation in first degree relative 02/15/2022   Radiculopathy 08/10/2019   Carpal tunnel syndrome, left 08/10/2019   GERD (gastroesophageal reflux disease) 08/10/2019   S/P laparoscopic assisted vaginal hysterectomy (LAVH) 02/25/2018   Family history of breast cancer     REFERRING DIAG: left breast cancer at risk for lymphedema  THERAPY DIAG: Aftercare following surgery  for neoplasm  PERTINENT HISTORY: left breast lumpectomy 07/16/22 and re-excision 08/08/22, final pathology showed 5 cm, invasive globular carcinoma, grade 1. ER 95% positive strong staining PR 95% positive strong staining, KI of 10% HER2 1+, no evidence of lymph node involvement 5 negative nodes removed.   PRECAUTIONS: left UE Lymphedema risk, None  SUBJECTIVE: Pt returns for her first 3 month L-Dex screen.   PAIN:  Are you having pain? No  SOZO SCREENING: Patient was assessed today using the SOZO  machine to determine the lymphedema index score. This was compared to her baseline score. It was determined that she is within the recommended range when compared to her baseline and no further action is needed at this time. She will continue SOZO screenings. These are done every 3 months for 2 years post operatively followed by every 6 months for 2 years, and then annually.   L-DEX FLOWSHEETS - 12/09/22 1600       L-DEX LYMPHEDEMA SCREENING   Measurement Type Unilateral    L-DEX MEASUREMENT EXTREMITY Upper Extremity    POSITION  Standing    DOMINANT SIDE Left    At Risk Side Left    BASELINE SCORE (UNILATERAL) -5.6    L-DEX SCORE (UNILATERAL) -6.2    VALUE CHANGE (UNILAT) -0.6               Hermenia Bers, PTA 12/09/2022, 4:51 PM

## 2023-02-04 ENCOUNTER — Encounter: Payer: 59 | Admitting: Adult Health

## 2023-02-20 ENCOUNTER — Other Ambulatory Visit: Payer: Self-pay

## 2023-02-20 DIAGNOSIS — C50412 Malignant neoplasm of upper-outer quadrant of left female breast: Secondary | ICD-10-CM

## 2023-02-21 ENCOUNTER — Other Ambulatory Visit (HOSPITAL_COMMUNITY): Payer: Self-pay

## 2023-02-21 ENCOUNTER — Encounter: Payer: Self-pay | Admitting: Adult Health

## 2023-02-21 ENCOUNTER — Inpatient Hospital Stay: Payer: 59 | Attending: Genetic Counselor | Admitting: Adult Health

## 2023-02-21 ENCOUNTER — Inpatient Hospital Stay: Payer: 59 | Attending: Genetic Counselor

## 2023-02-21 VITALS — BP 149/72 | HR 92 | Temp 98.0°F | Resp 18 | Wt 207.4 lb

## 2023-02-21 DIAGNOSIS — Z79810 Long term (current) use of selective estrogen receptor modulators (SERMs): Secondary | ICD-10-CM | POA: Diagnosis not present

## 2023-02-21 DIAGNOSIS — Z170 Estrogen receptor positive status [ER+]: Secondary | ICD-10-CM | POA: Insufficient documentation

## 2023-02-21 DIAGNOSIS — C50412 Malignant neoplasm of upper-outer quadrant of left female breast: Secondary | ICD-10-CM | POA: Diagnosis not present

## 2023-02-21 LAB — CMP (CANCER CENTER ONLY)
ALT: 9 U/L (ref 0–44)
AST: 12 U/L — ABNORMAL LOW (ref 15–41)
Albumin: 3.8 g/dL (ref 3.5–5.0)
Alkaline Phosphatase: 54 U/L (ref 38–126)
Anion gap: 4 — ABNORMAL LOW (ref 5–15)
BUN: 15 mg/dL (ref 6–20)
CO2: 30 mmol/L (ref 22–32)
Calcium: 8.9 mg/dL (ref 8.9–10.3)
Chloride: 107 mmol/L (ref 98–111)
Creatinine: 1.14 mg/dL — ABNORMAL HIGH (ref 0.44–1.00)
GFR, Estimated: 58 mL/min — ABNORMAL LOW (ref >60)
Glucose, Bld: 116 mg/dL — ABNORMAL HIGH (ref 70–99)
Potassium: 3.7 mmol/L (ref 3.5–5.1)
Sodium: 141 mmol/L (ref 135–145)
Total Bilirubin: 0.3 mg/dL (ref <1.2)
Total Protein: 6.7 g/dL (ref 6.5–8.1)

## 2023-02-21 LAB — CBC WITH DIFFERENTIAL (CANCER CENTER ONLY)
Abs Immature Granulocytes: 0 10*3/uL (ref 0.00–0.07)
Basophils Absolute: 0 10*3/uL (ref 0.0–0.1)
Basophils Relative: 1 %
Eosinophils Absolute: 0 10*3/uL (ref 0.0–0.5)
Eosinophils Relative: 1 %
HCT: 36.8 % (ref 36.0–46.0)
Hemoglobin: 11.4 g/dL — ABNORMAL LOW (ref 12.0–15.0)
Immature Granulocytes: 0 %
Lymphocytes Relative: 26 %
Lymphs Abs: 1.1 10*3/uL (ref 0.7–4.0)
MCH: 25.3 pg — ABNORMAL LOW (ref 26.0–34.0)
MCHC: 31 g/dL (ref 30.0–36.0)
MCV: 81.8 fL (ref 80.0–100.0)
Monocytes Absolute: 0.4 10*3/uL (ref 0.1–1.0)
Monocytes Relative: 9 %
Neutro Abs: 2.7 10*3/uL (ref 1.7–7.7)
Neutrophils Relative %: 63 %
Platelet Count: 213 10*3/uL (ref 150–400)
RBC: 4.5 MIL/uL (ref 3.87–5.11)
RDW: 13.9 % (ref 11.5–15.5)
WBC Count: 4.3 10*3/uL (ref 4.0–10.5)
nRBC: 0 % (ref 0.0–0.2)

## 2023-02-21 NOTE — Progress Notes (Signed)
SURVIVORSHIP VISIT:  BRIEF ONCOLOGIC HISTORY:  Oncology History  Malignant neoplasm of upper-outer quadrant of left breast in female, estrogen receptor positive (HCC)  06/18/2022 Initial Diagnosis   Left breast needle core biopsy x 2 at 2 o'clock 7 cmfn: ILC, grade 1, ER 95% positive, PR 95% positive, Ki67 10%, HER2 negative (1+).    07/16/2022 Surgery   Left breast lumpectomy: ILC, 5 cm, grade 1, posterior and medial margin positive, 5 lymph nodes negative for cancer.    08/08/2022 Surgery   Re-excision to clear margins   09/04/2022 Cancer Staging   Staging form: Breast, AJCC 8th Edition - Pathologic: Stage IA (pT2, pN0, cM0, G1, ER+, PR+, HER2-, Oncotype DX score: 6) - Signed by Antony Blackbird, MD on 09/04/2022 Multigene prognostic tests performed: Oncotype DX Recurrence score range: Less than 11 Histologic grading system: 3 grade system   09/18/2022 - 10/30/2022 Radiation Therapy   Site/dose: Left breast - 50.04 Gy delivered in 28 Fx at 1.8 Gy/Fx   12/2022 -  Anti-estrogen oral therapy   Tamoxifen     INTERVAL HISTORY:  Discussed the use of AI scribe software for clinical note transcription with the patient, who gave verbal consent to proceed.  Ms. Evarts to review her survivorship care plan detailing her treatment course for breast cancer, as well as monitoring long-term side effects of that treatment, education regarding health maintenance, screening, and overall wellness and health promotion.     Overall, Ms. Hernan reports feeling quite well.  She has been experincing  persistent shoulder pain. The pain is not constant but is triggered by certain movements such as reaching for something or lifting the arm high. The patient is unsure if this is a side effect of the radiation treatment they underwent.  Additionally, the patient reports experiencing hot flashes, predominantly at night, since starting tamoxifen in September. The hot flashes are severe enough to disrupt sleep and cause  discomfort. The patient is hopeful that these symptoms will subside by January or February.  The patient also mentions a desire to undergo genetic testing, having completed all the necessary steps except for the lab work. They have been adhering to their prescribed treatment regimen, which includes surgery, radiation, and antiestrogen therapy.  The patient is also participating in the [facility] program and has been given exercises to strengthen their shoulder. They have been making efforts to maintain a healthy diet and exercise regularly. Despite these challenges, the patient appears to be managing their condition and treatment side effects well.  REVIEW OF SYSTEMS:   Review of Systems  Constitutional:  Negative for appetite change, chills, fatigue, fever and unexpected weight change.  HENT:   Negative for hearing loss, lump/mass and trouble swallowing.   Eyes:  Negative for eye problems and icterus.  Respiratory:  Negative for chest tightness, cough and shortness of breath.   Cardiovascular:  Negative for chest pain, leg swelling and palpitations.  Gastrointestinal:  Negative for abdominal distention, abdominal pain, constipation, diarrhea, nausea and vomiting.  Endocrine: Positive for hot flashes.  Genitourinary:  Negative for difficulty urinating.   Musculoskeletal:  Negative for arthralgias.  Skin:  Negative for itching and rash.  Neurological:  Negative for dizziness, extremity weakness, headaches and numbness.  Hematological:  Negative for adenopathy. Does not bruise/bleed easily.  Psychiatric/Behavioral:  Positive for sleep disturbance. Negative for depression. The patient is not nervous/anxious.   Breast: Denies any new nodularity, masses, tenderness, nipple changes, or nipple discharge.       PAST MEDICAL/SURGICAL HISTORY:  Past Medical History:  Diagnosis Date   Anemia    Thalasemia   Cancer (HCC)    Left Breast   Carpal tunnel syndrome    COVID    2 times -  2020 and 2021    Family history of breast cancer    Finger fracture, left    5th digit (small finger)   GERD (gastroesophageal reflux disease)    PONV (postoperative nausea and vomiting)    PONV (postoperative nausea and vomiting) 08/10/2019   Varicose vein of leg    Past Surgical History:  Procedure Laterality Date   ABDOMINAL HYSTERECTOMY     AUGMENTATION MAMMAPLASTY Bilateral 2014   BREAST BIOPSY Left 06/11/2022   [place] LT BREAST BX W LOC DEV 1ST LESION IMG BX SPEC US GUIDE 06/11/2022 [facility]   BREAST BIOPSY Left 06/11/2022   [place] LT BREAST BX W LOC DEV EA ADD LESION IMG BX SPEC US GUIDE 06/11/2022 [facility]   BREAST BIOPSY  07/15/2022   MM LT RADIOACTIVE SEED LOC MAMMO GUIDE 07/15/2022 [facility]   BREAST BIOPSY  07/15/2022   MM LT RADIOACTIVE SEED EA ADD LESION LOC MAMMO GUIDE 07/15/2022 [facility]   BREAST LUMPECTOMY WITH RADIOACTIVE SEED AND SENTINEL LYMPH NODE BIOPSY Left 07/16/2022   Procedure: LEFT BREAST BRACKETED LUMPECTOMY WITH RADIOACTIVE SEED AND SENTINEL LYMPH NODE BIOPSY;  Surgeon: Harriette Bouillon, MD;  Location: [facility];  Service: General;  Laterality: Left;   LAPAROSCOPIC VAGINAL HYSTERECTOMY WITH SALPINGECTOMY Bilateral 02/25/2018   Procedure: LAPAROSCOPIC ASSISTED VAGINAL HYSTERECTOMY WITH SALPINGECTOMY;  Surgeon: Geryl Rankins, MD;  Location: [facility];  Service: Gynecology;  Laterality: Bilateral;   OPEN REDUCTION INTERNAL FIXATION (ORIF) METACARPAL Left 07/10/2017   Procedure: OPEN REDUCTION INTERNAL FIXATION (ORIF) METACARPAL;  Surgeon: Bradly Bienenstock, MD;  Location: [facility];  Service: Orthopedics;  Laterality: Left;   RE-EXCISION OF BREAST LUMPECTOMY Left 08/08/2022   Procedure: RE-EXCISION OF LEFT BREAST LUMPECTOMY;  Surgeon: Harriette Bouillon, MD;  Location: [facility];  Service: General;  Laterality: Left;   TUBAL LIGATION Bilateral 2005   PPTL   VARICOSE VEIN SURGERY Bilateral    surgery years ago and sclerothraphy ~ 2015     ALLERGIES:   No Known Allergies   CURRENT MEDICATIONS:  Outpatient Encounter Medications as of 02/21/2023  Medication Sig   MAGNESIUM PO Apply 1 spray topically at bedtime.   OIL OF OREGANO PO Take by mouth.   OVER THE COUNTER MEDICATION Take 5 mLs by mouth 3 (three) times a week. Black seed oil   tamoxifen (NOLVADEX) 20 MG tablet Take 1 tablet (20 mg total) by mouth daily.   UNABLE TO FIND Med Name: Sour sop   ASHWAGANDHA PO Take 2 capsules by mouth 3 (three) times a week. (Patient not taking: Reported on 12/09/2022)   UNABLE TO FIND Take 1 capsule by mouth 3 (three) times a week. Sea Rachelle Hora (Patient not taking: Reported on 12/09/2022)   No facility-administered encounter medications on file as of 02/21/2023.     ONCOLOGIC FAMILY HISTORY:  Family History  Problem Relation Age of Onset   Cancer Mother 80       breast cancer   Breast cancer Mother 74       second cancer at 28   Hyperlipidemia Father    Kidney disease Father    Hypertension Father    Evelene Croon Parkinson White syndrome Brother    Cancer Maternal Grandmother 62       breast   Breast cancer Maternal  Grandmother 70       second cancer at 27   Breast cancer Cousin        mother's maternal first cousin dx 2x under 50   Breast cancer Other    Colon cancer Neg Hx      SOCIAL HISTORY:  Social History   Socioeconomic History   Marital status: Divorced    Spouse name: Not on file   Number of children: 3   Years of education: Not on file   Highest education level: Not on file  Occupational History   Occupation: CMA, [employer]   Tobacco Use   Smoking status: Never   Smokeless tobacco: Never  Vaping Use   Vaping status: Never Used  Substance and Sexual Activity   Alcohol use: No   Drug use: No   Sexual activity: Not on file  Other Topics Concern   Not on file  Social History Narrative   Household:pt and one of her daughters    Social Determinants of Health   Financial Resource Strain: Low Risk  (06/19/2022)    Overall Financial Resource Strain (CARDIA)    Difficulty of Paying Living Expenses: Not very hard  Food Insecurity: No Food Insecurity (09/04/2022)   Hunger Vital Sign    Worried About Running Out of Food in the Last Year: Never true    Ran Out of Food in the Last Year: Never true  Transportation Needs: No Transportation Needs (06/19/2022)   PRAPARE - Administrator, Civil Service (Medical): No    Lack of Transportation (Non-Medical): No  Physical Activity: Not on file  Stress: Not on file  Social Connections: Not on file  Intimate Partner Violence: Not At Risk (09/04/2022)   Humiliation, Afraid, Rape, and Kick questionnaire    Fear of Current or Ex-Partner: No    Emotionally Abused: No    Physically Abused: No    Sexually Abused: No     OBSERVATIONS/OBJECTIVE:  BP (!) 149/72 (BP Location: Right Arm, Patient Position: Sitting)   Pulse 92   Temp 98 F (36.7 C) (Temporal)   Resp 18   Wt 207 lb 6.4 oz (94.1 kg)   LMP 02/13/2018   SpO2 98%   BMI 32.48 kg/m  GENERAL: Patient is a well appearing female in no acute distress HEENT:  Sclerae anicteric.  Oropharynx clear and moist. No ulcerations or evidence of oropharyngeal candidiasis. Neck is supple.  NODES:  No cervical, supraclavicular, or axillary lymphadenopathy palpated.  BREAST EXAM: Left breast status postlumpectomy and radiation no sign of local recurrence right breast is benign. LUNGS:  Clear to auscultation bilaterally.  No wheezes or rhonchi. HEART:  Regular rate and rhythm. No murmur appreciated. ABDOMEN:  Soft, nontender.  Positive, normoactive bowel sounds. No organomegaly palpated. MSK:  No focal spinal tenderness to palpation. Full range of motion bilaterally in the upper extremities. EXTREMITIES:  No peripheral edema.   SKIN:  Clear with no obvious rashes or skin changes. No nail dyscrasia. NEURO:  Nonfocal. Well oriented.  Appropriate affect.   LABORATORY DATA:  None for this visit.  DIAGNOSTIC  IMAGING:  None for this visit.      ASSESSMENT AND PLAN:  Ms.. Smyre is a pleasant 52 y.o. female with Stage 1A left breast invasive lobular carcinoma, ER+/PR+/HER2-, diagnosed in March 2024, treated with lumpectomy, adjuvant radiation therapy, and anti-estrogen therapy with tamoxifen beginning in 12/2022.  She presents to the Survivorship Clinic for our initial meeting and routine follow-up post-completion  of treatment for breast cancer.    1. Stage 1A left breast cancer:  Ms. Neubeck is continuing to recover from definitive treatment for breast cancer. She will follow-up with her medical oncologist, Dr.  Al Pimple with history and physical exam per surveillance protocol.  She will continue her anti-estrogen therapy with Tamoxifen. Thus far, she is tolerating the Tamoxifen well, with minimal side effects. Her mammogram is due 05/2023; orders placed today.   She agreed to undergo genetic testing and will return for lab only sometime in the next few weeks.   Today, a comprehensive survivorship care plan and treatment summary was reviewed with the patient today detailing her breast cancer diagnosis, treatment course, potential late/long-term effects of treatment, appropriate follow-up care with recommendations for the future, and patient education resources.  A copy of this summary, along with a letter will be sent to the patient's primary care provider via mail/fax/In Basket message after today's visit.    2. Shoulder pain: I offered to do xrays today, however damida declined.  She sees PT every 3 months for SOZO testing, next on 12/9.  Suggested that she ask for some shoulder exercises from them to work on her strength in her arm which could help her joint.  3. Bone health:  She was given education on specific activities to promote bone health.  4. Cancer screening:  Due to Ms. Schoenfelder's history and her age, she should receive screening for skin cancers, colon cancer, and gynecologic cancers.  The  information and recommendations are listed on the patient's comprehensive care plan/treatment summary and were reviewed in detail with the patient.    5. Health maintenance and wellness promotion: Ms. Guarente was encouraged to consume 5-7 servings of fruits and vegetables per day. We reviewed the "Nutrition Rainbow" handout.  She was also encouraged to engage in moderate to vigorous exercise for 30 minutes per day most days of the week.  She was instructed to limit her alcohol consumption and continue to abstain from tobacco use.     6. Support services/counseling: It is not uncommon for this period of the patient's cancer care trajectory to be one of many emotions and stressors.   She was given information regarding our available services and encouraged to contact me with any questions or for help enrolling in any of our support group/programs.    Follow up instructions:    -Return to cancer center in 6 months for f/u with Dr. Al Pimple  -Mammogram due in 05/2023 -She is welcome to return back to the Survivorship Clinic at any time; no additional follow-up needed at this time.  -Consider referral back to survivorship as a long-term survivor for continued surveillance  The patient was provided an opportunity to ask questions and all were answered. The patient agreed with the plan and demonstrated an understanding of the instructions.   Total encounter time:40 minutes*in face-to-face visit time, chart review, lab review, care coordination, order entry, and documentation of the encounter time.    Lillard Anes, NP 02/21/23 3:07 PM Medical Oncology and Hematology [facility] [address] [place], [place] [address] Tel. [phone]    Fax. [phone]  *Total Encounter Time as defined by the Centers for Medicare and Medicaid Services includes, in addition to the face-to-face time of a patient visit (documented in the note above) non-face-to-face time: obtaining and reviewing  outside history, ordering and reviewing medications, tests or procedures, care coordination (communications with other health care professionals or caregivers) and documentation in the medical  record.

## 2023-02-24 ENCOUNTER — Other Ambulatory Visit: Payer: Self-pay | Admitting: Nurse Practitioner

## 2023-02-24 ENCOUNTER — Telehealth: Payer: Self-pay

## 2023-02-24 DIAGNOSIS — N289 Disorder of kidney and ureter, unspecified: Secondary | ICD-10-CM

## 2023-02-24 NOTE — Telephone Encounter (Addendum)
Called pt per message below. LVM for pt to return call back.----- Message from Noreene Filbert sent at 02/24/2023  1:06 PM EST ----- Nursing: Let patient know her kidney function is elevated I recommend drinking plenty of water.  Please ask her if she is taking any new medications or over-the-counter medications.  I also recommend following up with her PCP about when to recheck labs.(PCP is attached).  Thanks, LC ----- Message ----- From: Leory Plowman, Lab In [place] Sent: 02/21/2023   2:53 PM EST To: Loa Socks, NP

## 2023-03-03 ENCOUNTER — Other Ambulatory Visit: Payer: Self-pay | Admitting: Internal Medicine

## 2023-03-03 MED ORDER — AIRSUPRA 90-80 MCG/ACT IN AERO: 2.0000 | INHALATION_SPRAY | RESPIRATORY_TRACT | 1 refills | Status: DC | PRN

## 2023-03-03 MED ORDER — AMOXICILLIN-POT CLAVULANATE 875-125 MG PO TABS: 1.0000 | ORAL_TABLET | Freq: Two times a day (BID) | ORAL | 0 refills | Status: AC

## 2023-03-03 NOTE — Progress Notes (Signed)
Patient known CMA from clinic called with around 10 days of chest congestion, wheezing, shortness of breath, and thick mucus production. Worsened over the weekend despite round of 20 mg prednisone x 5 days. Using albuterol PRN which is helpful. Afebrile.  Plan;  - Airsupra 2 puffs every 4 hours while awake for the next 2-3 days, then as needed (max 12 puffs/day); sample provided in clinic and Rx sent to pharmacy - Augmentin 875 mg BID x 7-10 days-take with probiotics or live cultured yogurt.  If no improvement in 2-3 days, may consider adding z-pack.

## 2023-03-10 ENCOUNTER — Ambulatory Visit: Payer: 59

## 2023-03-14 ENCOUNTER — Other Ambulatory Visit: Payer: 59

## 2023-03-17 ENCOUNTER — Other Ambulatory Visit: Payer: 59

## 2023-03-24 ENCOUNTER — Other Ambulatory Visit: Payer: 59

## 2023-03-24 ENCOUNTER — Ambulatory Visit: Payer: 59

## 2023-04-04 ENCOUNTER — Other Ambulatory Visit: Payer: Self-pay | Admitting: *Deleted

## 2023-04-04 DIAGNOSIS — C50412 Malignant neoplasm of upper-outer quadrant of left female breast: Secondary | ICD-10-CM

## 2023-04-07 ENCOUNTER — Other Ambulatory Visit (INDEPENDENT_AMBULATORY_CARE_PROVIDER_SITE_OTHER): Payer: 59

## 2023-04-07 ENCOUNTER — Ambulatory Visit: Payer: 59 | Attending: Surgery | Admitting: Rehabilitation

## 2023-04-07 ENCOUNTER — Inpatient Hospital Stay: Payer: 59 | Attending: Genetic Counselor

## 2023-04-07 DIAGNOSIS — Z483 Aftercare following surgery for neoplasm: Secondary | ICD-10-CM | POA: Insufficient documentation

## 2023-04-07 DIAGNOSIS — E7800 Pure hypercholesterolemia, unspecified: Secondary | ICD-10-CM

## 2023-04-07 DIAGNOSIS — C50412 Malignant neoplasm of upper-outer quadrant of left female breast: Secondary | ICD-10-CM | POA: Insufficient documentation

## 2023-04-07 DIAGNOSIS — N289 Disorder of kidney and ureter, unspecified: Secondary | ICD-10-CM | POA: Diagnosis not present

## 2023-04-07 DIAGNOSIS — Z170 Estrogen receptor positive status [ER+]: Secondary | ICD-10-CM | POA: Insufficient documentation

## 2023-04-07 DIAGNOSIS — R7301 Impaired fasting glucose: Secondary | ICD-10-CM

## 2023-04-07 DIAGNOSIS — Z9189 Other specified personal risk factors, not elsewhere classified: Secondary | ICD-10-CM | POA: Insufficient documentation

## 2023-04-07 DIAGNOSIS — Z1732 Human epidermal growth factor receptor 2 negative status: Secondary | ICD-10-CM | POA: Insufficient documentation

## 2023-04-07 DIAGNOSIS — Z79810 Long term (current) use of selective estrogen receptor modulators (SERMs): Secondary | ICD-10-CM | POA: Diagnosis not present

## 2023-04-07 DIAGNOSIS — Z1721 Progesterone receptor positive status: Secondary | ICD-10-CM | POA: Diagnosis not present

## 2023-04-07 LAB — CMP (CANCER CENTER ONLY)
ALT: 11 U/L (ref 0–44)
AST: 12 U/L — ABNORMAL LOW (ref 15–41)
Albumin: 4.1 g/dL (ref 3.5–5.0)
Alkaline Phosphatase: 53 U/L (ref 38–126)
Anion gap: 5 (ref 5–15)
BUN: 11 mg/dL (ref 6–20)
CO2: 27 mmol/L (ref 22–32)
Calcium: 9.2 mg/dL (ref 8.9–10.3)
Chloride: 107 mmol/L (ref 98–111)
Creatinine: 1.05 mg/dL — ABNORMAL HIGH (ref 0.44–1.00)
GFR, Estimated: >60 mL/min (ref >60)
Glucose, Bld: 98 mg/dL (ref 70–99)
Potassium: 4.4 mmol/L (ref 3.5–5.1)
Sodium: 139 mmol/L (ref 135–145)
Total Bilirubin: 0.4 mg/dL (ref 0.0–1.2)
Total Protein: 7 g/dL (ref 6.5–8.1)

## 2023-04-07 LAB — CBC WITH DIFFERENTIAL (CANCER CENTER ONLY)
Abs Immature Granulocytes: 0.01 10*3/uL (ref 0.00–0.07)
Basophils Absolute: 0 10*3/uL (ref 0.0–0.1)
Basophils Relative: 1 %
Eosinophils Absolute: 0 10*3/uL (ref 0.0–0.5)
Eosinophils Relative: 0 %
HCT: 38.3 % (ref 36.0–46.0)
Hemoglobin: 12 g/dL (ref 12.0–15.0)
Immature Granulocytes: 0 %
Lymphocytes Relative: 30 %
Lymphs Abs: 1.4 10*3/uL (ref 0.7–4.0)
MCH: 25.3 pg — ABNORMAL LOW (ref 26.0–34.0)
MCHC: 31.3 g/dL (ref 30.0–36.0)
MCV: 80.6 fL (ref 80.0–100.0)
Monocytes Absolute: 0.5 10*3/uL (ref 0.1–1.0)
Monocytes Relative: 11 %
Neutro Abs: 2.8 10*3/uL (ref 1.7–7.7)
Neutrophils Relative %: 58 %
Platelet Count: 244 10*3/uL (ref 150–400)
RBC: 4.75 MIL/uL (ref 3.87–5.11)
RDW: 13.6 % (ref 11.5–15.5)
WBC Count: 4.8 10*3/uL (ref 4.0–10.5)
nRBC: 0 % (ref 0.0–0.2)

## 2023-04-07 LAB — RENAL FUNCTION PANEL
Albumin: 4.1 g/dL (ref 3.5–5.2)
BUN: 12 mg/dL (ref 6–23)
CO2: 25 meq/L (ref 19–32)
Calcium: 9 mg/dL (ref 8.4–10.5)
Chloride: 107 meq/L (ref 96–112)
Creatinine, Ser: 1.03 mg/dL (ref 0.40–1.20)
GFR: 62.52 mL/min (ref >60.00)
Glucose, Bld: 97 mg/dL (ref 70–99)
Phosphorus: 2.6 mg/dL (ref 2.3–4.6)
Potassium: 4.1 meq/L (ref 3.5–5.1)
Sodium: 140 meq/L (ref 135–145)

## 2023-04-07 LAB — HEMOGLOBIN A1C: Hgb A1c MFr Bld: 6.1 % (ref 4.6–6.5)

## 2023-04-07 LAB — LIPID PANEL
Cholesterol: 198 mg/dL (ref 0–200)
HDL: 70 mg/dL (ref >39.00)
LDL Cholesterol: 114 mg/dL — ABNORMAL HIGH (ref 0–99)
NonHDL: 127.78
Total CHOL/HDL Ratio: 3
Triglycerides: 69 mg/dL (ref 0.0–149.0)
VLDL: 13.8 mg/dL (ref 0.0–40.0)

## 2023-04-07 NOTE — Therapy (Signed)
 OUTPATIENT PHYSICAL THERAPY SOZO SCREENING NOTE   Patient Name: Joanne Wilson MRN: [id number] DOB:08/02/1970, 53 y.o., female Today's Date: 04/07/2023  PCP: Katheen Roselie Rockford, NP REFERRING PROVIDER: Vanderbilt Ned, MD   PT End of Session - 04/07/23 1239     Visit Number 2   screen   PT Start Time 1235    PT Stop Time 1239    PT Time Calculation (min) 4 min    Activity Tolerance Patient tolerated treatment well    Behavior During Therapy WFL for tasks assessed/performed             Past Medical History:  Diagnosis Date   Anemia    Thalasemia   Cancer (HCC)    Left Breast   Carpal tunnel syndrome    COVID    2 times -  2020 and 2021   Family history of breast cancer    Finger fracture, left    5th digit (small finger)   GERD (gastroesophageal reflux disease)    PONV (postoperative nausea and vomiting)    PONV (postoperative nausea and vomiting) 08/10/2019   Varicose vein of leg    Past Surgical History:  Procedure Laterality Date   ABDOMINAL HYSTERECTOMY     AUGMENTATION MAMMAPLASTY Bilateral 2014   BREAST BIOPSY Left 06/11/2022   US  LT BREAST BX W LOC DEV 1ST LESION IMG BX SPEC US  GUIDE 06/11/2022 [facility]   BREAST BIOPSY Left 06/11/2022   US  LT BREAST BX W LOC DEV EA ADD LESION IMG BX SPEC US  GUIDE 06/11/2022 [facility]   BREAST BIOPSY  07/15/2022   MM LT RADIOACTIVE SEED LOC MAMMO GUIDE 07/15/2022 [facility]   BREAST BIOPSY  07/15/2022   MM LT RADIOACTIVE SEED EA ADD LESION LOC MAMMO GUIDE 07/15/2022 [facility]   BREAST LUMPECTOMY WITH RADIOACTIVE SEED AND SENTINEL LYMPH NODE BIOPSY Left 07/16/2022   Procedure: LEFT BREAST BRACKETED LUMPECTOMY WITH RADIOACTIVE SEED AND SENTINEL LYMPH NODE BIOPSY;  Surgeon: Vanderbilt Ned, MD;  Location: [facility];  Service: General;  Laterality: Left;   LAPAROSCOPIC VAGINAL HYSTERECTOMY WITH SALPINGECTOMY Bilateral 02/25/2018   Procedure: LAPAROSCOPIC ASSISTED VAGINAL HYSTERECTOMY WITH SALPINGECTOMY;   Surgeon: Timmie Norris, MD;  Location: [facility];  Service: Gynecology;  Laterality: Bilateral;   OPEN REDUCTION INTERNAL FIXATION (ORIF) METACARPAL Left 07/10/2017   Procedure: OPEN REDUCTION INTERNAL FIXATION (ORIF) METACARPAL;  Surgeon: Shari Easter, MD;  Location: [facility];  Service: Orthopedics;  Laterality: Left;   RE-EXCISION OF BREAST LUMPECTOMY Left 08/08/2022   Procedure: RE-EXCISION OF LEFT BREAST LUMPECTOMY;  Surgeon: Vanderbilt Ned, MD;  Location: [facility];  Service: General;  Laterality: Left;   TUBAL LIGATION Bilateral 2005   PPTL   VARICOSE VEIN SURGERY Bilateral    surgery years ago and sclerothraphy ~ 2015   Patient Active Problem List   Diagnosis Date Noted   Pure hypercholesterolemia 11/04/2022   IFG (impaired fasting glucose) 11/04/2022   Genetic testing 06/24/2022   Malignant neoplasm of upper-outer quadrant of left breast in female, estrogen receptor positive (HCC) 06/18/2022   Breast implant status 02/15/2022   Family history of breast cancer gene mutation in first degree relative 02/15/2022   Radiculopathy 08/10/2019   Carpal tunnel syndrome, left 08/10/2019   GERD (gastroesophageal reflux disease) 08/10/2019   S/P laparoscopic assisted vaginal hysterectomy (LAVH) 02/25/2018   Family history of breast cancer     REFERRING DIAG: left breast cancer at risk for lymphedema  THERAPY DIAG: Aftercare following surgery for neoplasm  Malignant neoplasm  of upper-outer quadrant of left breast in female, estrogen receptor positive (HCC)  At risk for lymphedema  PERTINENT HISTORY: left breast lumpectomy 07/16/22 and re-excision 08/08/22, final pathology showed 5 cm, invasive globular carcinoma, grade 1. ER 95% positive strong staining PR 95% positive strong staining, KI of 10% HER2 1+, no evidence of lymph node involvement 5 negative nodes removed.   PRECAUTIONS: left UE Lymphedema risk, None  SUBJECTIVE: Pt returns for her second 3 month L-Dex screen.    PAIN:  Are you having pain? No  SOZO SCREENING: Patient was assessed today using the SOZO machine to determine the lymphedema index score. This was compared to her baseline score. It was determined that she is within the recommended range when compared to her baseline and no further action is needed at this time. She will continue SOZO screenings. These are done every 3 months for 2 years post operatively followed by every 6 months for 2 years, and then annually.       Larue Saddie SAUNDERS, PT 04/07/2023, 12:40 PM

## 2023-04-07 NOTE — Addendum Note (Signed)
 Addended by: Tonita Phoenix on: 04/07/2023 10:58 AM   Modules accepted: Orders

## 2023-05-09 ENCOUNTER — Ambulatory Visit
Admission: RE | Admit: 2023-05-09 | Discharge: 2023-05-09 | Disposition: A | Discharge status: Home / Self Care | Payer: 59 | Source: Ambulatory Visit | Attending: Adult Health | Admitting: Adult Health

## 2023-05-09 DIAGNOSIS — Z853 Personal history of malignant neoplasm of breast: Secondary | ICD-10-CM | POA: Diagnosis not present

## 2023-05-09 DIAGNOSIS — Z9882 Breast implant status: Secondary | ICD-10-CM | POA: Diagnosis not present

## 2023-05-09 DIAGNOSIS — Z170 Estrogen receptor positive status [ER+]: Secondary | ICD-10-CM

## 2023-06-03 ENCOUNTER — Other Ambulatory Visit (HOSPITAL_BASED_OUTPATIENT_CLINIC_OR_DEPARTMENT_OTHER): Payer: Self-pay

## 2023-07-14 ENCOUNTER — Ambulatory Visit: Payer: 59 | Attending: Surgery

## 2023-07-14 DIAGNOSIS — Z170 Estrogen receptor positive status [ER+]: Secondary | ICD-10-CM | POA: Insufficient documentation

## 2023-07-14 DIAGNOSIS — C50412 Malignant neoplasm of upper-outer quadrant of left female breast: Secondary | ICD-10-CM | POA: Insufficient documentation

## 2023-07-14 DIAGNOSIS — Z483 Aftercare following surgery for neoplasm: Secondary | ICD-10-CM | POA: Insufficient documentation

## 2023-07-14 DIAGNOSIS — Z9189 Other specified personal risk factors, not elsewhere classified: Secondary | ICD-10-CM | POA: Insufficient documentation

## 2023-08-26 ENCOUNTER — Telehealth: Payer: Self-pay

## 2023-08-26 NOTE — Telephone Encounter (Signed)
 Left a message to confirm appt for 5/28

## 2023-08-27 ENCOUNTER — Inpatient Hospital Stay: Payer: 59 | Attending: Hematology and Oncology | Admitting: Hematology and Oncology

## 2023-08-27 ENCOUNTER — Other Ambulatory Visit (HOSPITAL_BASED_OUTPATIENT_CLINIC_OR_DEPARTMENT_OTHER): Payer: Self-pay

## 2023-08-27 ENCOUNTER — Other Ambulatory Visit (HOSPITAL_COMMUNITY): Payer: Self-pay

## 2023-08-27 VITALS — BP 150/82 | HR 96 | Temp 98.1°F | Resp 17 | Ht 67.0 in | Wt 207.3 lb

## 2023-08-27 DIAGNOSIS — Z79810 Long term (current) use of selective estrogen receptor modulators (SERMs): Secondary | ICD-10-CM | POA: Diagnosis not present

## 2023-08-27 DIAGNOSIS — Z1732 Human epidermal growth factor receptor 2 negative status: Secondary | ICD-10-CM | POA: Insufficient documentation

## 2023-08-27 DIAGNOSIS — Z170 Estrogen receptor positive status [ER+]: Secondary | ICD-10-CM | POA: Diagnosis not present

## 2023-08-27 DIAGNOSIS — Z1721 Progesterone receptor positive status: Secondary | ICD-10-CM | POA: Diagnosis not present

## 2023-08-27 DIAGNOSIS — Z923 Personal history of irradiation: Secondary | ICD-10-CM | POA: Diagnosis not present

## 2023-08-27 DIAGNOSIS — C50412 Malignant neoplasm of upper-outer quadrant of left female breast: Secondary | ICD-10-CM | POA: Insufficient documentation

## 2023-08-27 MED ORDER — TAMOXIFEN CITRATE 20 MG PO TABS
20.0000 mg | ORAL_TABLET | Freq: Every day | ORAL | 3 refills | Status: AC
  Filled 2023-08-27 – 2023-11-26 (×3): qty 90, 90d supply, fill #0
  Filled 2024-02-25: qty 90, 90d supply, fill #1

## 2023-08-27 NOTE — Progress Notes (Signed)
 BRIEF ONCOLOGIC HISTORY:  Oncology History  Malignant neoplasm of upper-outer quadrant of left breast in female, estrogen receptor positive (HCC)  06/18/2022 Initial Diagnosis   Left breast needle core biopsy x 2 at 2 o'clock 7 cmfn: ILC, grade 1, ER 95% positive, PR 95% positive, Ki67 10%, HER2 negative (1+).    07/16/2022 Surgery   Left breast lumpectomy: ILC, 5 cm, grade 1, posterior and medial margin positive, 5 lymph nodes negative for cancer.    08/08/2022 Surgery   Re-excision to clear margins   09/04/2022 Cancer Staging   Staging form: Breast, AJCC 8th Edition - Pathologic: Stage IA (pT2, pN0, cM0, G1, ER+, PR+, HER2-, Oncotype DX score: 6) - Signed by Retta Caster, MD on 09/04/2022 Multigene prognostic tests performed: Oncotype DX Recurrence score range: Less than 11 Histologic grading system: 3 grade system   09/18/2022 - 10/30/2022 Radiation Therapy   Site/dose: Left breast - 50.04 Gy delivered in 28 Fx at 1.8 Gy/Fx   12/2022 -  Anti-estrogen oral therapy   Tamoxifen      INTERVAL HISTORY:  Discussed the use of AI scribe software for clinical note transcription with the patient, who gave verbal consent to proceed.  History of Present Illness Joanne Wilson is a 53 year old female with lobular breast cancer who presents for follow-up regarding tamoxifen  therapy and concerns about breast implants.  She experiences hot flashes, primarily at night, which have increased in frequency over the past month. These episodes are more pronounced around the time she would typically expect her menstrual cycle, although she no longer menstruates. Despite the hot flashes, she continues tamoxifen  therapy and describes the symptoms as tolerable.  She inquires about the use of antioxidants and herbal supplements while on tamoxifen , expressing concern about potential interactions. She mentions consuming soursop and black seed oil in liquid form.  She has a history of left breast lumpectomy with  adjuvant radiation and currently has retro pectoral implants. She reports that the implants have moved upwards, causing discomfort and concern. She describes the breast as 'going up under my arms' and the implants as tending to 'roll over'. She has not seen a surgeon recently but recalls previous discussions about the possibility of implant removal and reconstruction, which were discouraged due to potential complications.  She experiences swelling in her ankles, which she attributes to being on her feet frequently. Additionally, she has difficulty lifting her arm, although she notes improvement with increased exercise.  She had a mammogram in February and is surprised by the annual frequency, as she previously had them every six months. She is on tamoxifen  for a planned duration of five years, with discussions about potentially extending this due to her type of breast cancer.  She reports sleeping well and engaging in regular exercise. She had blood work done in January, with some results being borderline but not concerning.    REVIEW OF SYSTEMS:   Review of Systems  Constitutional:  Negative for appetite change, chills, fatigue, fever and unexpected weight change.  HENT:   Negative for hearing loss, lump/mass and trouble swallowing.   Eyes:  Negative for eye problems and icterus.  Respiratory:  Negative for chest tightness, cough and shortness of breath.   Cardiovascular:  Negative for chest pain, leg swelling and palpitations.  Gastrointestinal:  Negative for abdominal distention, abdominal pain, constipation, diarrhea, nausea and vomiting.  Endocrine: Positive for hot flashes.  Genitourinary:  Negative for difficulty urinating.   Musculoskeletal:  Negative for arthralgias.  Skin:  Negative  for itching and rash.  Neurological:  Negative for dizziness, extremity weakness, headaches and numbness.  Hematological:  Negative for adenopathy. Does not bruise/bleed easily.  Psychiatric/Behavioral:   Positive for sleep disturbance. Negative for depression. The patient is not nervous/anxious.   Breast: Denies any new nodularity, masses, tenderness, nipple changes, or nipple discharge.     PAST MEDICAL/SURGICAL HISTORY:  Past Medical History:  Diagnosis Date   Anemia    Thalasemia   Cancer (HCC)    Left Breast   Carpal tunnel syndrome    COVID    2 times -  2020 and 2021   Family history of breast cancer    Finger fracture, left    5th digit (small finger)   GERD (gastroesophageal reflux disease)    PONV (postoperative nausea and vomiting)    PONV (postoperative nausea and vomiting) 08/10/2019   Varicose vein of leg    Past Surgical History:  Procedure Laterality Date   ABDOMINAL HYSTERECTOMY     AUGMENTATION MAMMAPLASTY Bilateral 2014   BREAST BIOPSY Left 06/11/2022   US  LT BREAST BX W LOC DEV 1ST LESION IMG BX SPEC US  GUIDE 06/11/2022 [facility]   BREAST BIOPSY Left 06/11/2022   US  LT BREAST BX W LOC DEV EA ADD LESION IMG BX SPEC US  GUIDE 06/11/2022 [facility]   BREAST BIOPSY  07/15/2022   MM LT RADIOACTIVE SEED LOC MAMMO GUIDE 07/15/2022 [facility]   BREAST BIOPSY  07/15/2022   MM LT RADIOACTIVE SEED EA ADD LESION LOC MAMMO GUIDE 07/15/2022 [facility]   BREAST LUMPECTOMY WITH RADIOACTIVE SEED AND SENTINEL LYMPH NODE BIOPSY Left 07/16/2022   Procedure: LEFT BREAST BRACKETED LUMPECTOMY WITH RADIOACTIVE SEED AND SENTINEL LYMPH NODE BIOPSY;  Surgeon: Sim Dryer, MD;  Location: [facility];  Service: General;  Laterality: Left;   LAPAROSCOPIC VAGINAL HYSTERECTOMY WITH SALPINGECTOMY Bilateral 02/25/2018   Procedure: LAPAROSCOPIC ASSISTED VAGINAL HYSTERECTOMY WITH SALPINGECTOMY;  Surgeon: Johnn Najjar, MD;  Location: [facility];  Service: Gynecology;  Laterality: Bilateral;   OPEN REDUCTION INTERNAL FIXATION (ORIF) METACARPAL Left 07/10/2017   Procedure: OPEN REDUCTION INTERNAL FIXATION (ORIF) METACARPAL;  Surgeon: Arvil Birks, MD;  [facility];  Service: Orthopedics;  Laterality: Left;   RE-EXCISION OF BREAST LUMPECTOMY Left 08/08/2022   Procedure: RE-EXCISION OF LEFT BREAST LUMPECTOMY;  Surgeon: Sim Dryer, MD;  Location: [facility];  Service: General;  Laterality: Left;   TUBAL LIGATION Bilateral 2005   PPTL   VARICOSE VEIN SURGERY Bilateral    surgery years ago and sclerothraphy ~ 2015     ALLERGIES:  No Known Allergies   CURRENT MEDICATIONS:  Outpatient Encounter Medications as of 08/27/2023  Medication Sig   Albuterol -Budesonide (AIRSUPRA ) 90-80 MCG/ACT AERO Inhale 2 puffs into the lungs as needed (maximum 12 puffs/day).   ASHWAGANDHA PO Take 2 capsules by mouth 3 (three) times a week. (Patient not taking: Reported on 12/09/2022)   MAGNESIUM PO Apply 1 spray topically at bedtime.   OIL OF OREGANO PO Take by mouth.   OVER THE COUNTER MEDICATION Take 5 mLs by mouth 3 (three) times a week. Black seed oil   tamoxifen  (NOLVADEX ) 20 MG tablet Take 1 tablet (20 mg total) by mouth daily.   UNABLE TO FIND Take 1 capsule by mouth 3 (three) times a week. Sea Artemio Larry (Patient not taking: Reported on 12/09/2022)   UNABLE TO FIND Med Name: Sour sop   No facility-administered encounter medications on file as of 08/27/2023.     ONCOLOGIC FAMILY HISTORY:  Family History  Problem Relation Age of Onset   Cancer Mother 44       breast cancer   Breast cancer Mother 44       second cancer at 14   Hyperlipidemia Father    Kidney disease Father    Hypertension Father    Fredrick Jenkins Parkinson White syndrome Brother    Cancer Maternal Grandmother 55       breast   Breast cancer Maternal Grandmother 65       second cancer at 44   Breast cancer Cousin        mother's maternal first cousin dx 2x under 50   Breast cancer Other    Colon cancer Neg Hx      SOCIAL HISTORY:  Social History   Socioeconomic History   Marital status: Divorced    Spouse name: Not on file   Number of children: 3   Years of education: Not on file    Highest education level: Not on file  Occupational History   Occupation: CMA, [employer]   Tobacco Use   Smoking status: Never   Smokeless tobacco: Never  Vaping Use   Vaping status: Never Used  Substance and Sexual Activity   Alcohol use: No   Drug use: No   Sexual activity: Not on file  Other Topics Concern   Not on file  Social History Narrative   Household:pt and one of her daughters    Social Drivers of Corporate investment banker Strain: Low Risk  (06/19/2022)   Overall Financial Resource Strain (CARDIA)    Difficulty of Paying Living Expenses: Not very hard  Food Insecurity: Unknown (09/04/2022)   Hunger Vital Sign    Worried About Running Out of Food in the Last Year: Never true    Ran Out of Food in the Last Year: Not on file  Transportation Needs: No Transportation Needs (06/19/2022)   PRAPARE - Administrator, Civil Service (Medical): No    Lack of Transportation (Non-Medical): No  Physical Activity: Not on file  Stress: Not on file  Social Connections: Not on file  Intimate Partner Violence: Not At Risk (09/04/2022)   Humiliation, Afraid, Rape, and Kick questionnaire    Fear of Current or Ex-Partner: No    Emotionally Abused: No    Physically Abused: No    Sexually Abused: No     OBSERVATIONS/OBJECTIVE:  BP (!) 150/82 Comment: MD notified  Pulse 96   Temp 98.1 F (36.7 C) (Temporal)   Resp 17   Ht 5\' 7"  (1.702 m)   Wt 207 lb 4.8 oz (94 kg)   LMP 02/13/2018   SpO2 96%   BMI 32.47 kg/m  GENERAL: Patient is a well appearing female in no acute distress Bilateral breasts with retropectoral implants moved upward and outward. Post rad changes left breast. No palpable masses. No regional adenopathy   LABORATORY DATA:  None for this visit.  DIAGNOSTIC IMAGING:  None for this visit.      ASSESSMENT AND PLAN:  Ms.. Treloar is a pleasant 53 y.o. female with Stage 1A left breast invasive lobular carcinoma, ER+/PR+/HER2-, diagnosed in  March 2024, treated with lumpectomy, adjuvant radiation therapy, and anti-estrogen therapy with tamoxifen  beginning in 12/2022.    Assessment and Plan Assessment & Plan Invasive lobular breast cancer She is on adjuvant tamoxifen . Consider 5-7 yrs of antiestrogen therapy. - Continue tamoxifen  therapy. - Provide tamoxifen  refill. - Schedule diagnostic mammograms up to five  years.  Hot flashes due to tamoxifen  Intermittent nocturnal hot flashes associated with tamoxifen . Advised to report if bothersome. Non-hormonal treatments available if needed.  Implant displacement Displacement of breast implants with upward movement. Discussed consulting a plastic surgeon for evaluation due to previous radiation treatment. Referral placed to Dr Marieta Shorten.  The patient was provided an opportunity to ask questions and all were answered. The patient agreed with the plan and demonstrated an understanding of the instructions.   Total encounter time:30 minutes*in face-to-face visit time, chart review, lab review, care coordination, order entry, and documentation of the encounter time.   *Total Encounter Time as defined by the Centers for Medicare and Medicaid Services includes, in addition to the face-to-face time of a patient visit (documented in the note above) non-face-to-face time: obtaining and reviewing outside history, ordering and reviewing medications, tests or procedures, care coordination (communications with other health care professionals or caregivers) and documentation in the medical record.

## 2023-09-25 ENCOUNTER — Other Ambulatory Visit: Payer: Self-pay | Admitting: Internal Medicine

## 2023-09-25 MED ORDER — SULFAMETHOXAZOLE-TRIMETHOPRIM 800-160 MG PO TABS: 1.0000 | ORAL_TABLET | Freq: Two times a day (BID) | ORAL | 0 refills | Status: AC

## 2023-09-25 NOTE — Progress Notes (Signed)
 Concern for cellulitis of right lateral lower extermity above ankles, suspected due to spider/insect bite.  Swelling, redness noted, not indurated and no fluctuant mass.  Tx:  Bactrim 800 mg BID x 7 days.

## 2023-11-05 ENCOUNTER — Telehealth: Payer: Self-pay

## 2023-11-05 NOTE — Telephone Encounter (Signed)
 Pt RS for CPE with PCP on 8/21 at 1:40, this time works better for the pt. Pt made aware of fasting instructions.

## 2023-11-05 NOTE — Telephone Encounter (Signed)
 Copied from CRM [id number]. Topic: Appointments - Appointment Info/Confirmation >> Nov 05, 2023 11:41 AM Rosina BIRCH wrote: Patient/patient representative is calling for information regarding an appointment.   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 Patient want to see if there is a day that the doctor can do before that time CB [phone]

## 2023-11-20 ENCOUNTER — Encounter: Admitting: Nurse Practitioner

## 2023-11-20 ENCOUNTER — Ambulatory Visit (INDEPENDENT_AMBULATORY_CARE_PROVIDER_SITE_OTHER): Admitting: Nurse Practitioner

## 2023-11-20 ENCOUNTER — Encounter: Payer: Self-pay | Admitting: Nurse Practitioner

## 2023-11-20 VITALS — BP 138/82 | HR 69 | Temp 98.5°F | Ht 67.0 in | Wt 208.6 lb

## 2023-11-20 DIAGNOSIS — Z79810 Long term (current) use of selective estrogen receptor modulators (SERMs): Secondary | ICD-10-CM

## 2023-11-20 DIAGNOSIS — Z0001 Encounter for general adult medical examination with abnormal findings: Secondary | ICD-10-CM

## 2023-11-20 DIAGNOSIS — Z0000 Encounter for general adult medical examination without abnormal findings: Secondary | ICD-10-CM | POA: Diagnosis not present

## 2023-11-20 DIAGNOSIS — Z780 Asymptomatic menopausal state: Secondary | ICD-10-CM

## 2023-11-20 DIAGNOSIS — R7301 Impaired fasting glucose: Secondary | ICD-10-CM

## 2023-11-20 DIAGNOSIS — E559 Vitamin D deficiency, unspecified: Secondary | ICD-10-CM | POA: Diagnosis not present

## 2023-11-20 DIAGNOSIS — E7800 Pure hypercholesterolemia, unspecified: Secondary | ICD-10-CM | POA: Diagnosis not present

## 2023-11-20 LAB — LIPID PANEL
Cholesterol: 213 mg/dL — ABNORMAL HIGH (ref 0–200)
HDL: 85.9 mg/dL (ref >39.00)
LDL Cholesterol: 112 mg/dL — ABNORMAL HIGH (ref 0–99)
NonHDL: 127.26
Total CHOL/HDL Ratio: 2
Triglycerides: 77 mg/dL (ref 0.0–149.0)
VLDL: 15.4 mg/dL (ref 0.0–40.0)

## 2023-11-20 LAB — HEMOGLOBIN A1C: Hgb A1c MFr Bld: 6.1 % (ref 4.6–6.5)

## 2023-11-20 LAB — VITAMIN D 25 HYDROXY (VIT D DEFICIENCY, FRACTURES): VITD: 53.61 ng/mL (ref 30.00–100.00)

## 2023-11-20 NOTE — Progress Notes (Signed)
 Complete physical exam  Patient: Joanne Wilson   DOB: 1970/04/20   53 y.o. Female  MRN: [id number] Visit Date: 11/20/2023  Subjective:    Chief Complaint  Patient presents with   Annual Exam    FASTING  Discuss not overlapping labs with oncology  DUE for Colonoscopy, Pneumococcal, Zoster Vaccine    Joanne Wilson is a 53 y.o. female who presents today for a complete physical exam. She reports consuming a general diet. Cardio and strength training 3-5x/week She generally feels well. She reports sleeping fairly well. She does not have additional problems to discuss today.  Vision:No Dental:Within Last 6 months STD Screen:No  BP Readings from Last 3 Encounters:  11/20/23 138/82  08/27/23 (!) 150/82  02/21/23 (!) 149/72   Wt Readings from Last 3 Encounters:  11/20/23 208 lb 9.6 oz (94.6 kg)  08/27/23 207 lb 4.8 oz (94 kg)  02/21/23 207 lb 6.4 oz (94.1 kg)   Most recent fall risk assessment:    11/20/2023    1:46 PM  Fall Risk   Falls in the past year? 0  Injury with Fall? 0  Risk for fall due to : No Fall Risks  Follow up Falls evaluation completed   Depression screen:Yes - No Depression Most recent depression screenings:    11/20/2023    1:46 PM 11/04/2022    2:43 PM  PHQ 2/9 Scores  PHQ - 2 Score 0 0  PHQ- 9 Score 0 3   HPI  No problem-specific Assessment & Plan notes found for this encounter.   Past Medical History:  Diagnosis Date   Anemia    Thalasemia   Cancer (HCC)    Left Breast   Carpal tunnel syndrome    COVID    2 times -  2020 and 2021   Family history of breast cancer    Finger fracture, left    5th digit (small finger)   GERD (gastroesophageal reflux disease)    PONV (postoperative nausea and vomiting)    PONV (postoperative nausea and vomiting) 08/10/2019   Varicose vein of leg    Past Surgical History:  Procedure Laterality Date   ABDOMINAL HYSTERECTOMY     AUGMENTATION MAMMAPLASTY Bilateral 2014   BREAST BIOPSY Left 06/11/2022   US   LT BREAST BX W LOC DEV 1ST LESION IMG BX SPEC US  GUIDE 06/11/2022 [facility]   BREAST BIOPSY Left 06/11/2022   US  LT BREAST BX W LOC DEV EA ADD LESION IMG BX SPEC US  GUIDE 06/11/2022 [facility]   BREAST BIOPSY  07/15/2022   MM LT RADIOACTIVE SEED LOC MAMMO GUIDE 07/15/2022 [facility]   BREAST BIOPSY  07/15/2022   MM LT RADIOACTIVE SEED EA ADD LESION LOC MAMMO GUIDE 07/15/2022 [facility]   BREAST LUMPECTOMY WITH RADIOACTIVE SEED AND SENTINEL LYMPH NODE BIOPSY Left 07/16/2022   Procedure: LEFT BREAST BRACKETED LUMPECTOMY WITH RADIOACTIVE SEED AND SENTINEL LYMPH NODE BIOPSY;  Surgeon: Vanderbilt Ned, MD;  Location: [facility];  Service: General;  Laterality: Left;   LAPAROSCOPIC VAGINAL HYSTERECTOMY WITH SALPINGECTOMY Bilateral 02/25/2018   Procedure: LAPAROSCOPIC ASSISTED VAGINAL HYSTERECTOMY WITH SALPINGECTOMY;  Surgeon: Timmie Norris, MD;  Location: [facility];  Service: Gynecology;  Laterality: Bilateral;   OPEN REDUCTION INTERNAL FIXATION (ORIF) METACARPAL Left 07/10/2017   Procedure: OPEN REDUCTION INTERNAL FIXATION (ORIF) METACARPAL;  Surgeon: Shari Easter, MD;  Location: [facility];  Service: Orthopedics;  Laterality: Left;   RE-EXCISION OF BREAST LUMPECTOMY Left 08/08/2022   Procedure: RE-EXCISION OF LEFT  BREAST LUMPECTOMY;  Surgeon: Vanderbilt Ned, MD;  Location: [facility];  Service: General;  Laterality: Left;   TUBAL LIGATION Bilateral 2005   PPTL   VARICOSE VEIN SURGERY Bilateral    surgery years ago and sclerothraphy ~ 2015   Social History   Socioeconomic History   Marital status: Divorced    Spouse name: Not on file   Number of children: 3   Years of education: Not on file   Highest education level: Not on file  Occupational History   Occupation: CMA, [employer]   Tobacco Use   Smoking status: Never   Smokeless tobacco: Never  Vaping Use   Vaping status: Never Used  Substance and Sexual Activity   Alcohol use: No   Drug use:  No   Sexual activity: Not on file  Other Topics Concern   Not on file  Social History Narrative   Household:pt and one of her daughters    Social Drivers of Health   Financial Resource Strain: Low Risk  (06/19/2022)   Overall Financial Resource Strain (CARDIA)    Difficulty of Paying Living Expenses: Not very hard  Food Insecurity: Unknown (09/04/2022)   Hunger Vital Sign    Worried About Running Out of Food in the Last Year: Never true    Ran Out of Food in the Last Year: Not on file  Transportation Needs: No Transportation Needs (06/19/2022)   PRAPARE - Administrator, Civil Service (Medical): No    Lack of Transportation (Non-Medical): No  Physical Activity: Not on file  Stress: Not on file  Social Connections: Not on file  Intimate Partner Violence: Not At Risk (09/04/2022)   Humiliation, Afraid, Rape, and Kick questionnaire    Fear of Current or Ex-Partner: No    Emotionally Abused: No    Physically Abused: No    Sexually Abused: No   Family Status  Relation Name Status   Mother  Alive   Father  Deceased   Sister 4 pat half Alive   Sister 2 pat half twinse Deceased   Brother mat 1/2 Deceased   Brother 2 mat 1/2 Armed forces training and education officer   Brother 3 pat 1/2 Alive   Mat Aunt  Alive   Nutritional therapist x3 Alive   MGM  Alive   MGF  Deceased   PGM  Deceased   PGF  Deceased   Cousin  Alive   Other 2 mat great aunt Deceased   Neg Hx  (Not Specified)  No partnership data on file   Family History  Problem Relation Age of Onset   Cancer Mother 81       breast cancer   Breast cancer Mother 53       second cancer at 45   Heart disease Father    Hyperlipidemia Father    Kidney disease Father    Hypertension Father    Lung disease Father        complications from COVID   [place] Parkinson White syndrome Brother    Insurance account manager Parkinson White syndrome Brother        pacemaker inserted   Cancer Maternal Grandmother 7       breast   Breast cancer Maternal Grandmother 44       second cancer  at 14   Breast cancer Cousin        mother's maternal first cousin dx 2x under 50   Breast cancer Other    Colon cancer Neg Hx  No Known Allergies  Patient Care Team: Jamason Peckham, Roselie Rockford, NP as PCP - General (Internal Medicine) Vanderbilt Ned, MD as Consulting Physician (General Surgery) Loretha Ash, MD as Consulting Physician (Hematology and Oncology) Shannon Agent, MD as Consulting Physician (Radiation Oncology)   Medications: Outpatient Medications Prior to Visit  Medication Sig   ASHWAGANDHA PO Take 2 capsules by mouth 3 (three) times a week. (Patient taking differently: Take 2 capsules by mouth as needed.)   MAGNESIUM PO Apply 1 spray topically at bedtime.   OIL OF OREGANO PO Take by mouth.   OVER THE COUNTER MEDICATION Take 5 mLs by mouth 3 (three) times a week. Black seed oil   tamoxifen  (NOLVADEX ) 20 MG tablet Take 1 tablet (20 mg total) by mouth daily.   UNABLE TO FIND Take 1 capsule by mouth 3 (three) times a week. Sea Moss   UNABLE TO FIND Med Name: Sour sop   [DISCONTINUED] Albuterol -Budesonide (AIRSUPRA ) 90-80 MCG/ACT AERO Inhale 2 puffs into the lungs as needed (maximum 12 puffs/day). (Patient not taking: Reported on 11/20/2023)   No facility-administered medications prior to visit.    Review of Systems  Constitutional:  Negative for activity change, appetite change and unexpected weight change.  Respiratory: Negative.    Cardiovascular: Negative.   Gastrointestinal: Negative.   Endocrine: Negative for cold intolerance and heat intolerance.  Genitourinary: Negative.   Musculoskeletal: Negative.   Skin: Negative.   Neurological: Negative.   Hematological: Negative.   Psychiatric/Behavioral:  Negative for behavioral problems, decreased concentration, dysphoric mood, hallucinations, self-injury, sleep disturbance and suicidal ideas. The patient is not nervous/anxious.         Objective:  BP 138/82 (BP Location: Left Arm, Patient Position: Sitting, Cuff  Size: Large)   Pulse 69   Temp 98.5 F (36.9 C) (Oral)   Ht 5' 7 (1.702 m)   Wt 208 lb 9.6 oz (94.6 kg)   LMP 02/13/2018   SpO2 99%   BMI 32.67 kg/m     Physical Exam Vitals and nursing note reviewed.  Constitutional:      General: She is not in acute distress. HENT:     Right Ear: Tympanic membrane, ear canal and external ear normal.     Left Ear: Tympanic membrane, ear canal and external ear normal.     Nose: Nose normal.  Eyes:     Extraocular Movements: Extraocular movements intact.     Conjunctiva/sclera: Conjunctivae normal.     Pupils: Pupils are equal, round, and reactive to light.  Neck:     Thyroid: No thyroid mass, thyromegaly or thyroid tenderness.  Cardiovascular:     Rate and Rhythm: Normal rate and regular rhythm.     Pulses: Normal pulses.     Heart sounds: Normal heart sounds.  Pulmonary:     Effort: Pulmonary effort is normal.     Breath sounds: Normal breath sounds.  Abdominal:     General: Bowel sounds are normal.     Palpations: Abdomen is soft.  Musculoskeletal:        General: Normal range of motion.     Cervical back: Normal range of motion and neck supple.     Right lower leg: No edema.     Left lower leg: No edema.  Lymphadenopathy:     Cervical: No cervical adenopathy.  Skin:    General: Skin is warm and dry.  Neurological:     Mental Status: She is alert and oriented to person, place, and time.  Cranial Nerves: No cranial nerve deficit.  Psychiatric:        Mood and Affect: Mood normal.        Behavior: Behavior normal.        Thought Content: Thought content normal.     No results found for any visits on 11/20/23.    Assessment & Plan:    Routine Health Maintenance and Physical Exam  Immunization History  Administered Date(s) Administered   Influenza-Unspecified 01/03/2022   MMR 07/24/1998   Td 07/24/1998   Health Maintenance  Topic Date Due   Colonoscopy  Never done   INFLUENZA VACCINE  12/01/2023 (Originally  10/31/2023)   Zoster Vaccines- Shingrix (1 of 2) 02/20/2024 (Originally 10/02/1989)   Pneumococcal Vaccine: 50+ Years (1 of 2 - PCV) 11/19/2024 (Originally 10/02/1989)   Hepatitis B Vaccines 19-59 Average Risk (1 of 3 - 19+ 3-dose series) 11/19/2024 (Originally 10/02/1989)   Hepatitis C Screening  11/19/2024 (Originally 10/02/1988)   HIV Screening  11/19/2024 (Originally 10/02/1985)   MAMMOGRAM  05/08/2024   HPV VACCINES  Aged Out   Meningococcal B Vaccine  Aged Out   DTaP/Tdap/Td  Discontinued   COVID-19 Vaccine  Discontinued   Discussed health benefits of physical activity, and encouraged her to engage in regular exercise appropriate for her age and condition. Advised to schedule appointment with GI for colonoscopy.  Problem List Items Addressed This Visit     IFG (impaired fasting glucose)   Relevant Orders   Hemoglobin A1c   Pure hypercholesterolemia   Relevant Orders   Lipid panel   Other Visit Diagnoses       Encounter for preventative adult health care exam with abnormal findings    -  Primary     Vitamin D  deficiency       Relevant Orders   VITAMIN D  25 Hydroxy (Vit-D Deficiency, Fractures)     Asymptomatic postmenopausal estrogen deficiency       Relevant Orders   DG Bone Density     Long-term current use of tamoxifen        Relevant Orders   DG Bone Density      Return in about 1 year (around 11/19/2024) for CPE (fasting).     Roselie Mood, NP

## 2023-11-20 NOTE — Patient Instructions (Signed)
 Schedule appointment for colonoscopy- [phone] Go to lab Maintain Heart healthy diet and daily exercise. Maintain current medications.

## 2023-11-26 ENCOUNTER — Other Ambulatory Visit: Payer: Self-pay

## 2023-11-26 ENCOUNTER — Other Ambulatory Visit (HOSPITAL_COMMUNITY): Payer: Self-pay

## 2023-11-28 ENCOUNTER — Ambulatory Visit: Payer: Self-pay | Admitting: Nurse Practitioner

## 2023-12-17 ENCOUNTER — Encounter: Admitting: Nurse Practitioner

## 2024-01-20 ENCOUNTER — Telehealth: Payer: Self-pay | Admitting: Nurse Practitioner

## 2024-01-20 DIAGNOSIS — Z780 Asymptomatic menopausal state: Secondary | ICD-10-CM

## 2024-01-20 DIAGNOSIS — Z79810 Long term (current) use of selective estrogen receptor modulators (SERMs): Secondary | ICD-10-CM

## 2024-01-20 NOTE — Telephone Encounter (Signed)
 Please reorder Dexa scan at [facility]. [facility] is no longer scheduling Dexa/Bone Density scans. Please route message back to Admin team to contact patient for scheduling.

## 2024-02-25 ENCOUNTER — Other Ambulatory Visit (HOSPITAL_COMMUNITY): Payer: Self-pay

## 2024-03-02 ENCOUNTER — Telehealth: Payer: Self-pay | Admitting: Hematology and Oncology

## 2024-03-02 NOTE — Telephone Encounter (Signed)
 Patient called in to reschedule appointments from 03/03/3034 to 04/16/2024 for lab and MD f/u. Patient aware of date/time.

## 2024-03-03 ENCOUNTER — Inpatient Hospital Stay

## 2024-03-03 ENCOUNTER — Inpatient Hospital Stay: Admitting: Hematology and Oncology

## 2024-04-15 ENCOUNTER — Other Ambulatory Visit: Payer: Self-pay

## 2024-04-15 DIAGNOSIS — Z170 Estrogen receptor positive status [ER+]: Secondary | ICD-10-CM

## 2024-04-16 ENCOUNTER — Other Ambulatory Visit: Payer: Self-pay | Admitting: Adult Health

## 2024-04-16 ENCOUNTER — Inpatient Hospital Stay

## 2024-04-16 ENCOUNTER — Telehealth: Payer: Self-pay | Admitting: Hematology and Oncology

## 2024-04-16 ENCOUNTER — Inpatient Hospital Stay: Admitting: Hematology and Oncology

## 2024-04-16 DIAGNOSIS — Z853 Personal history of malignant neoplasm of breast: Secondary | ICD-10-CM

## 2024-04-16 NOTE — Telephone Encounter (Signed)
 I SPOKE TO PATIENT AS SHE CALLED IN TO RESCHEDULE LAB AND MD FROM 04/16/2024 TO 04/23/2024.

## 2024-04-23 ENCOUNTER — Inpatient Hospital Stay: Attending: Hematology and Oncology

## 2024-04-23 ENCOUNTER — Inpatient Hospital Stay: Admitting: Hematology and Oncology

## 2024-04-23 VITALS — BP 142/88 | HR 77 | Temp 97.9°F | Resp 18 | Wt 213.4 lb

## 2024-04-23 DIAGNOSIS — C50412 Malignant neoplasm of upper-outer quadrant of left female breast: Secondary | ICD-10-CM

## 2024-04-23 DIAGNOSIS — Z170 Estrogen receptor positive status [ER+]: Secondary | ICD-10-CM | POA: Diagnosis not present

## 2024-04-23 LAB — CMP (CANCER CENTER ONLY)
ALT: 12 U/L (ref 0–44)
AST: 15 U/L (ref 15–41)
Albumin: 4 g/dL (ref 3.5–5.0)
Alkaline Phosphatase: 57 U/L (ref 38–126)
Anion gap: 11 (ref 5–15)
BUN: 13 mg/dL (ref 6–20)
CO2: 24 mmol/L (ref 22–32)
Calcium: 8.9 mg/dL (ref 8.9–10.3)
Chloride: 105 mmol/L (ref 98–111)
Creatinine: 0.96 mg/dL (ref 0.44–1.00)
GFR, Estimated: >60 mL/min
Glucose, Bld: 92 mg/dL (ref 70–99)
Potassium: 3.8 mmol/L (ref 3.5–5.1)
Sodium: 140 mmol/L (ref 135–145)
Total Bilirubin: 0.3 mg/dL (ref 0.0–1.2)
Total Protein: 6.7 g/dL (ref 6.5–8.1)

## 2024-04-23 LAB — CBC WITH DIFFERENTIAL (CANCER CENTER ONLY)
Abs Immature Granulocytes: 0.01 K/uL (ref 0.00–0.07)
Basophils Absolute: 0 K/uL (ref 0.0–0.1)
Basophils Relative: 1 %
Eosinophils Absolute: 0 K/uL (ref 0.0–0.5)
Eosinophils Relative: 1 %
HCT: 34.7 % — ABNORMAL LOW (ref 36.0–46.0)
Hemoglobin: 11.3 g/dL — ABNORMAL LOW (ref 12.0–15.0)
Immature Granulocytes: 0 %
Lymphocytes Relative: 33 %
Lymphs Abs: 1.5 K/uL (ref 0.7–4.0)
MCH: 25.6 pg — ABNORMAL LOW (ref 26.0–34.0)
MCHC: 32.6 g/dL (ref 30.0–36.0)
MCV: 78.7 fL — ABNORMAL LOW (ref 80.0–100.0)
Monocytes Absolute: 0.4 K/uL (ref 0.1–1.0)
Monocytes Relative: 9 %
Neutro Abs: 2.5 K/uL (ref 1.7–7.7)
Neutrophils Relative %: 56 %
Platelet Count: 219 K/uL (ref 150–400)
RBC: 4.41 MIL/uL (ref 3.87–5.11)
RDW: 13.5 % (ref 11.5–15.5)
WBC Count: 4.3 K/uL (ref 4.0–10.5)
nRBC: 0 % (ref 0.0–0.2)

## 2024-04-23 NOTE — Progress Notes (Signed)
 " BRIEF ONCOLOGIC HISTORY:  Oncology History  Malignant neoplasm of upper-outer quadrant of left breast in female, estrogen receptor positive (HCC)  06/18/2022 Initial Diagnosis   Left breast needle core biopsy x 2 at 2 o'clock 7 cmfn: ILC, grade 1, ER 95% positive, PR 95% positive, Ki67 10%, HER2 negative (1+).    07/16/2022 Surgery   Left breast lumpectomy: ILC, 5 cm, grade 1, posterior and medial margin positive, 5 lymph nodes negative for cancer.    08/08/2022 Surgery   Re-excision to clear margins   09/04/2022 Cancer Staging   Staging form: Breast, AJCC 8th Edition - Pathologic: Stage IA (pT2, pN0, cM0, G1, ER+, PR+, HER2-, Oncotype DX score: 6) - Signed by Shannon Agent, MD on 09/04/2022 Multigene prognostic tests performed: Oncotype DX Recurrence score range: Less than 11 Histologic grading system: 3 grade system   09/18/2022 - 10/30/2022 Radiation Therapy   Site/dose: Left breast - 50.04 Gy delivered in 28 Fx at 1.8 Gy/Fx   12/2022 -  Anti-estrogen oral therapy   Tamoxifen      INTERVAL HISTORY:  Discussed the use of AI scribe software for clinical note transcription with the patient, who gave verbal consent to proceed.  History of Present Illness  Joanne Wilson is a 54 year old female with estrogen receptor-positive breast cancer on tamoxifen  who presents for routine oncology follow-up.  She remains adherent to tamoxifen  and tolerates endocrine therapy. She experiences intermittent hot flashes, which have recently improved in severity and frequency. The vasomotor symptoms fluctuate, occasionally worsening but generally remain manageable. She suspects dietary triggers such as sweets or tea may contribute, and notes improvement following a recent three-day fast. She does not require additional pharmacologic intervention for these symptoms.  She has bilateral breast implants placed twelve years ago, with lateral migration under the axillae. She is not seeking surgical revision of her  breast implants. She is scheduled for routine mammographic surveillance and breast examination.  She experiences mild, chronic ankle edema, stable in nature. She has a history of varicose veins and prior vein removal procedures, and finds compression socks beneficial, particularly in colder months. Her occupational activity has decreased, with less prolonged standing at work.  She has thalassemia minor with mild chronic anemia; her hemoglobin remains slightly low but stable. She does not experience significant fatigue and does not take iron supplementation. Her energy levels have improved since cessation of menses.  Rest of the pertinent 10 point ROS reviewed and neg  PAST MEDICAL/SURGICAL HISTORY:  Past Medical History:  Diagnosis Date   Anemia    Thalasemia   Cancer (HCC)    Left Breast   Carpal tunnel syndrome    COVID    2 times -  2020 and 2021   Family history of breast cancer    Finger fracture, left    5th digit (small finger)   GERD (gastroesophageal reflux disease)    PONV (postoperative nausea and vomiting)    PONV (postoperative nausea and vomiting) 08/10/2019   Varicose vein of leg    Past Surgical History:  Procedure Laterality Date   ABDOMINAL HYSTERECTOMY     AUGMENTATION MAMMAPLASTY Bilateral 2014   BREAST BIOPSY Left 06/11/2022   US  LT BREAST BX W LOC DEV 1ST LESION IMG BX SPEC US  GUIDE 06/11/2022 [facility]   BREAST BIOPSY Left 06/11/2022   US  LT BREAST BX W LOC DEV EA ADD LESION IMG BX SPEC US  GUIDE 06/11/2022 [facility]   BREAST BIOPSY  07/15/2022   MM LT RADIOACTIVE SEED  LOC MAMMO GUIDE 07/15/2022 [facility]   BREAST BIOPSY  07/15/2022   MM LT RADIOACTIVE SEED EA ADD LESION LOC MAMMO GUIDE 07/15/2022 [facility]   BREAST LUMPECTOMY WITH RADIOACTIVE SEED AND SENTINEL LYMPH NODE BIOPSY Left 07/16/2022   Procedure: LEFT BREAST BRACKETED LUMPECTOMY WITH RADIOACTIVE SEED AND SENTINEL LYMPH NODE BIOPSY;  Surgeon: Vanderbilt Ned, MD;   Location: [facility];  Service: General;  Laterality: Left;   LAPAROSCOPIC VAGINAL HYSTERECTOMY WITH SALPINGECTOMY Bilateral 02/25/2018   Procedure: LAPAROSCOPIC ASSISTED VAGINAL HYSTERECTOMY WITH SALPINGECTOMY;  Surgeon: Timmie Norris, MD;  Location: [facility];  Service: Gynecology;  Laterality: Bilateral;   OPEN REDUCTION INTERNAL FIXATION (ORIF) METACARPAL Left 07/10/2017   Procedure: OPEN REDUCTION INTERNAL FIXATION (ORIF) METACARPAL;  Surgeon: Shari Easter, MD;  Location: [facility];  Service: Orthopedics;  Laterality: Left;   RE-EXCISION OF BREAST LUMPECTOMY Left 08/08/2022   Procedure: RE-EXCISION OF LEFT BREAST LUMPECTOMY;  Surgeon: Vanderbilt Ned, MD;  Location: [facility];  Service: General;  Laterality: Left;   TUBAL LIGATION Bilateral 2005   PPTL   VARICOSE VEIN SURGERY Bilateral    surgery years ago and sclerothraphy ~ 2015     ALLERGIES:  No Known Allergies   CURRENT MEDICATIONS:  Outpatient Encounter Medications as of 04/23/2024  Medication Sig   ASHWAGANDHA PO Take 2 capsules by mouth 3 (three) times a week.   MAGNESIUM PO Apply 1 spray topically at bedtime.   OIL OF OREGANO PO Take by mouth.   OVER THE COUNTER MEDICATION Take 5 mLs by mouth 3 (three) times a week. Black seed oil   tamoxifen  (NOLVADEX ) 20 MG tablet Take 1 tablet (20 mg total) by mouth daily.   UNABLE TO FIND Take 1 capsule by mouth 3 (three) times a week. Sea Moss   UNABLE TO FIND Med Name: Sour sop   No facility-administered encounter medications on file as of 04/23/2024.     ONCOLOGIC FAMILY HISTORY:  Family History  Problem Relation Age of Onset   Cancer Mother 47       breast cancer   Breast cancer Mother 14       second cancer at 68   Heart disease Father    Hyperlipidemia Father    Kidney disease Father    Hypertension Father    Lung disease Father        complications from COVID   [employer] Parkinson White syndrome Brother    Insurance Account Manager Parkinson White syndrome Brother        pacemaker  inserted   Cancer Maternal Grandmother 64       breast   Breast cancer Maternal Grandmother 74       second cancer at 5   Breast cancer Cousin        mother's maternal first cousin dx 2x under 50   Breast cancer Other    Colon cancer Neg Hx      SOCIAL HISTORY:  Social History   Socioeconomic History   Marital status: Divorced    Spouse name: Not on file   Number of children: 3   Years of education: Not on file   Highest education level: Not on file  Occupational History   Occupation: CMA, [employer]   Tobacco Use   Smoking status: Never   Smokeless tobacco: Never  Vaping Use   Vaping status: Never Used  Substance and Sexual Activity   Alcohol use: No   Drug use: No   Sexual activity: Not on file  Other Topics Concern   Not on file  Social History Narrative   Household:pt and one of her daughters    Social Drivers of Health   Tobacco Use: Low Risk (11/20/2023)   Patient History    Smoking Tobacco Use: Never    Smokeless Tobacco Use: Never    Passive Exposure: Not on file  Financial Resource Strain: Low Risk (06/19/2022)   Overall Financial Resource Strain (CARDIA)    Difficulty of Paying Living Expenses: Not very hard  Food Insecurity: Unknown (09/04/2022)   Hunger Vital Sign    Worried About Running Out of Food in the Last Year: Never true    Ran Out of Food in the Last Year: Not on file  Transportation Needs: No Transportation Needs (06/19/2022)   PRAPARE - Administrator, Civil Service (Medical): No    Lack of Transportation (Non-Medical): No  Physical Activity: Not on file  Stress: Not on file  Social Connections: Not on file  Intimate Partner Violence: Not At Risk (09/04/2022)   Humiliation, Afraid, Rape, and Kick questionnaire    Fear of Current or Ex-Partner: No    Emotionally Abused: No    Physically Abused: No    Sexually Abused: No  Depression (PHQ2-9): Low Risk (11/20/2023)   Depression (PHQ2-9)    PHQ-2 Score: 0  Alcohol  Screen: Not on file  Housing: Low Risk (06/19/2022)   Housing    Last Housing Risk Score: 0  Utilities: Not At Risk (09/04/2022)   [facility] Utilities    Threatened with loss of utilities: No  Health Literacy: Not on file     OBSERVATIONS/OBJECTIVE:  BP (!) 142/88 (BP Location: Right Arm, Patient Position: Sitting) Comment: BP recheck manually by CMA Deritra.  Pulse 77   Temp 97.9 F (36.6 C) (Temporal)   Resp 18   Wt 213 lb 6.4 oz (96.8 kg)   LMP 02/13/2018   SpO2 100%   BMI 33.42 kg/m   GENERAL: Patient is a well appearing female in no acute distress Bilateral breasts with retropectoral implants moved upward and outward. Post rad changes left breast. No palpable masses. No regional adenopathy   LABORATORY DATA:  None for this visit.  DIAGNOSTIC IMAGING:  None for this visit.      ASSESSMENT AND PLAN:  Ms.. Blankenburg is a pleasant 55 y.o. female with Stage 1A left breast invasive lobular carcinoma, ER+/PR+/HER2-, diagnosed in March 2024, treated with lumpectomy, adjuvant radiation therapy, and anti-estrogen therapy with tamoxifen  beginning in 12/2022.   Assessment & Plan Invasive lobular breast cancer On exam no concern for recurrence, bilateral breasts with implants. She is on adjuvant tamoxifen . Consider 5-7 yrs of antiestrogen therapy. Stable on tamoxifen  with no recurrence. - Confirmed mammogram is scheduled. - Reviewed blood counts, noting mild anemia, she says she was diagnosed with thal minor. - Recommended annual follow-up. - Provided anticipatory guidance to contact clinic for new symptoms.  Hot flashes secondary to endocrine therapy Hot flashes improved and tolerable, with occasional exacerbations. Pharmacologic intervention not required. - Discussed non-pharmacologic management: hydration, exercise, and dietary modifications. - Reviewed pharmacologic options for severe symptoms: venlafaxine, gabapentin , and over-the-counter black cohosh (with warning regarding  potential hepatotoxicity).  Chronic venous insufficiency Longstanding mild lower extremity edema and varicosities, well-managed with conservative measures. - Recommended use of compression socks, especially during colder months. - Discussed selection of appropriate socks for comfort and efficacy.  Chronic mild anemia due to thalassemia minor, asymptomatic. Iron supplementation not indicated. - Advised against iron supplementation. -  Reviewed laboratory findings confirming stable mild anemia attributable to thalassemia minor.  The patient was provided an opportunity to ask questions and all were answered. The patient agreed with the plan and demonstrated an understanding of the instructions.   Total encounter time:30 minutes*in face-to-face visit time, chart review, lab review, care coordination, order entry, and documentation of the encounter time.   *Total Encounter Time as defined by the Centers for Medicare and Medicaid Services includes, in addition to the face-to-face time of a patient visit (documented in the note above) non-face-to-face time: obtaining and reviewing outside history, ordering and reviewing medications, tests or procedures, care coordination (communications with other health care professionals or caregivers) and documentation in the medical record.  "

## 2024-05-14 ENCOUNTER — Encounter

## 2025-04-29 ENCOUNTER — Inpatient Hospital Stay: Admitting: Hematology and Oncology

## 2025-04-29 ENCOUNTER — Inpatient Hospital Stay
